# Patient Record
Sex: Male | Born: 1960 | Race: Black or African American | Hispanic: No | Marital: Single | Smoking: Current every day smoker
Health system: Southern US, Community
[De-identification: ages and names within clinical notes are randomized; demographics above are authoritative.]

## PROBLEM LIST (undated history)

## (undated) DIAGNOSIS — E119 Type 2 diabetes mellitus without complications: Secondary | ICD-10-CM

## (undated) DIAGNOSIS — E7800 Pure hypercholesterolemia, unspecified: Secondary | ICD-10-CM

## (undated) DIAGNOSIS — K259 Gastric ulcer, unspecified as acute or chronic, without hemorrhage or perforation: Secondary | ICD-10-CM

## (undated) DIAGNOSIS — I10 Essential (primary) hypertension: Secondary | ICD-10-CM

## (undated) HISTORY — DX: Gastric ulcer, unspecified as acute or chronic, without hemorrhage or perforation: K25.9

## (undated) HISTORY — PX: COLONOSCOPY: SHX174

## (undated) HISTORY — PX: FEMUR CLOSED REDUCTION: SHX939

---

## 2005-08-10 ENCOUNTER — Emergency Department: Payer: Self-pay | Admitting: Internal Medicine

## 2007-02-19 ENCOUNTER — Emergency Department: Payer: Self-pay | Admitting: Emergency Medicine

## 2007-02-25 ENCOUNTER — Emergency Department: Payer: Self-pay | Admitting: Emergency Medicine

## 2008-06-08 ENCOUNTER — Emergency Department: Payer: Self-pay | Admitting: Emergency Medicine

## 2008-09-25 ENCOUNTER — Inpatient Hospital Stay: Payer: Self-pay | Admitting: Internal Medicine

## 2008-12-04 ENCOUNTER — Emergency Department: Payer: Self-pay | Admitting: Internal Medicine

## 2013-11-12 ENCOUNTER — Emergency Department: Payer: Self-pay | Admitting: Emergency Medicine

## 2013-11-12 LAB — COMPREHENSIVE METABOLIC PANEL
ALK PHOS: 171 U/L — AB
Albumin: 3.3 g/dL — ABNORMAL LOW (ref 3.4–5.0)
Anion Gap: 6 — ABNORMAL LOW (ref 7–16)
BUN: 11 mg/dL (ref 7–18)
Bilirubin,Total: 0.5 mg/dL (ref 0.2–1.0)
CHLORIDE: 104 mmol/L (ref 98–107)
CREATININE: 1.27 mg/dL (ref 0.60–1.30)
Calcium, Total: 9 mg/dL (ref 8.5–10.1)
Co2: 26 mmol/L (ref 21–32)
EGFR (African American): >60
EGFR (Non-African Amer.): >60
Glucose: 434 mg/dL — ABNORMAL HIGH (ref 65–99)
Osmolality: 290 (ref 275–301)
Potassium: 4.1 mmol/L (ref 3.5–5.1)
SGOT(AST): 24 U/L (ref 15–37)
SGPT (ALT): 32 U/L (ref 12–78)
Sodium: 136 mmol/L (ref 136–145)
Total Protein: 7.3 g/dL (ref 6.4–8.2)

## 2013-11-12 LAB — CBC
HCT: 41.4 % (ref 40.0–52.0)
HGB: 14.3 g/dL (ref 13.0–18.0)
MCH: 32 pg (ref 26.0–34.0)
MCHC: 34.7 g/dL (ref 32.0–36.0)
MCV: 92 fL (ref 80–100)
Platelet: 226 10*3/uL (ref 150–440)
RBC: 4.48 10*6/uL (ref 4.40–5.90)
RDW: 12.9 % (ref 11.5–14.5)
WBC: 8.1 10*3/uL (ref 3.8–10.6)

## 2013-11-12 LAB — URINALYSIS, COMPLETE
BILIRUBIN, UR: NEGATIVE
Bacteria: NONE SEEN
Glucose,UR: >=500 mg/dL (ref 0–75)
Ketone: NEGATIVE
NITRITE: NEGATIVE
PH: 5 (ref 4.5–8.0)
Protein: NEGATIVE
RBC,UR: 17 /HPF (ref 0–5)
Specific Gravity: 1.029 (ref 1.003–1.030)
WBC UR: 18 /HPF (ref 0–5)

## 2014-04-28 ENCOUNTER — Emergency Department: Payer: Self-pay | Admitting: Emergency Medicine

## 2014-04-28 LAB — LIPASE, BLOOD: Lipase: 121 U/L (ref 73–393)

## 2014-04-28 LAB — BASIC METABOLIC PANEL
ANION GAP: 9 (ref 7–16)
BUN: 9 mg/dL (ref 7–18)
Calcium, Total: 8.4 mg/dL — ABNORMAL LOW (ref 8.5–10.1)
Chloride: 105 mmol/L (ref 98–107)
Co2: 23 mmol/L (ref 21–32)
Creatinine: 1.2 mg/dL (ref 0.60–1.30)
Glucose: 492 mg/dL — ABNORMAL HIGH (ref 65–99)
OSMOLALITY: 294 (ref 275–301)
POTASSIUM: 3.7 mmol/L (ref 3.5–5.1)
Sodium: 137 mmol/L (ref 136–145)

## 2014-04-28 LAB — HEPATIC FUNCTION PANEL A (ARMC)
ALBUMIN: 3.2 g/dL — AB (ref 3.4–5.0)
ALK PHOS: 147 U/L — AB
ALT: 40 U/L
AST: 26 U/L (ref 15–37)
BILIRUBIN TOTAL: 0.5 mg/dL (ref 0.2–1.0)
Bilirubin, Direct: 0.1 mg/dL (ref 0.0–0.2)
Total Protein: 6.7 g/dL (ref 6.4–8.2)

## 2014-04-28 LAB — CBC
HCT: 39.4 % — AB (ref 40.0–52.0)
HGB: 13.7 g/dL (ref 13.0–18.0)
MCH: 31.8 pg (ref 26.0–34.0)
MCHC: 34.6 g/dL (ref 32.0–36.0)
MCV: 92 fL (ref 80–100)
Platelet: 224 10*3/uL (ref 150–440)
RBC: 4.3 10*6/uL — ABNORMAL LOW (ref 4.40–5.90)
RDW: 12.6 % (ref 11.5–14.5)
WBC: 9.2 10*3/uL (ref 3.8–10.6)

## 2014-04-28 LAB — TROPONIN I

## 2014-05-08 ENCOUNTER — Emergency Department: Payer: Self-pay | Admitting: Emergency Medicine

## 2014-05-08 LAB — COMPREHENSIVE METABOLIC PANEL
Albumin: 3.1 g/dL — ABNORMAL LOW (ref 3.4–5.0)
Alkaline Phosphatase: 141 U/L — ABNORMAL HIGH
Anion Gap: 13 (ref 7–16)
BUN: 10 mg/dL (ref 7–18)
Bilirubin,Total: 0.4 mg/dL (ref 0.2–1.0)
CHLORIDE: 103 mmol/L (ref 98–107)
Calcium, Total: 7.8 mg/dL — ABNORMAL LOW (ref 8.5–10.1)
Co2: 22 mmol/L (ref 21–32)
Creatinine: 0.98 mg/dL (ref 0.60–1.30)
EGFR (African American): >60
EGFR (Non-African Amer.): >60
GLUCOSE: 416 mg/dL — AB (ref 65–99)
OSMOLALITY: 292 (ref 275–301)
POTASSIUM: 3.5 mmol/L (ref 3.5–5.1)
SGOT(AST): 18 U/L (ref 15–37)
SGPT (ALT): 31 U/L
SODIUM: 138 mmol/L (ref 136–145)
TOTAL PROTEIN: 5.9 g/dL — AB (ref 6.4–8.2)

## 2014-05-08 LAB — CBC
HCT: 37.6 % — AB (ref 40.0–52.0)
HGB: 13.6 g/dL (ref 13.0–18.0)
MCH: 34.1 pg — ABNORMAL HIGH (ref 26.0–34.0)
MCHC: 36.2 g/dL — ABNORMAL HIGH (ref 32.0–36.0)
MCV: 94 fL (ref 80–100)
PLATELETS: 258 10*3/uL (ref 150–440)
RBC: 3.99 10*6/uL — AB (ref 4.40–5.90)
RDW: 13.1 % (ref 11.5–14.5)
WBC: 9.8 10*3/uL (ref 3.8–10.6)

## 2014-05-08 LAB — LIPASE, BLOOD: LIPASE: 165 U/L (ref 73–393)

## 2014-05-08 LAB — URINALYSIS, COMPLETE
BILIRUBIN, UR: NEGATIVE
BLOOD: NEGATIVE
Bacteria: NONE SEEN
Glucose,UR: >=500 mg/dL (ref 0–75)
Ketone: NEGATIVE
Leukocyte Esterase: NEGATIVE
Nitrite: NEGATIVE
PROTEIN: NEGATIVE
Ph: 6 (ref 4.5–8.0)
Specific Gravity: 1.035 (ref 1.003–1.030)
Squamous Epithelial: <1
WBC UR: <1 /HPF (ref 0–5)

## 2014-05-08 LAB — TROPONIN I: Troponin-I: <0.02 ng/mL

## 2014-05-15 ENCOUNTER — Ambulatory Visit: Payer: Self-pay | Admitting: Gastroenterology

## 2014-05-19 ENCOUNTER — Emergency Department (HOSPITAL_COMMUNITY)
Admission: EM | Admit: 2014-05-19 | Discharge: 2014-05-19 | Disposition: A | Discharge status: Home / Self Care | Payer: Self-pay | Attending: Emergency Medicine | Admitting: Emergency Medicine

## 2014-05-19 ENCOUNTER — Encounter (HOSPITAL_COMMUNITY): Payer: Self-pay | Admitting: Emergency Medicine

## 2014-05-19 ENCOUNTER — Emergency Department (HOSPITAL_COMMUNITY): Payer: Self-pay

## 2014-05-19 DIAGNOSIS — M549 Dorsalgia, unspecified: Secondary | ICD-10-CM | POA: Insufficient documentation

## 2014-05-19 DIAGNOSIS — E119 Type 2 diabetes mellitus without complications: Secondary | ICD-10-CM | POA: Insufficient documentation

## 2014-05-19 DIAGNOSIS — I10 Essential (primary) hypertension: Secondary | ICD-10-CM | POA: Insufficient documentation

## 2014-05-19 DIAGNOSIS — K253 Acute gastric ulcer without hemorrhage or perforation: Secondary | ICD-10-CM | POA: Insufficient documentation

## 2014-05-19 DIAGNOSIS — R195 Other fecal abnormalities: Secondary | ICD-10-CM | POA: Insufficient documentation

## 2014-05-19 DIAGNOSIS — R109 Unspecified abdominal pain: Secondary | ICD-10-CM

## 2014-05-19 DIAGNOSIS — R079 Chest pain, unspecified: Secondary | ICD-10-CM | POA: Insufficient documentation

## 2014-05-19 DIAGNOSIS — K5900 Constipation, unspecified: Secondary | ICD-10-CM | POA: Insufficient documentation

## 2014-05-19 HISTORY — DX: Type 2 diabetes mellitus without complications: E11.9

## 2014-05-19 HISTORY — DX: Essential (primary) hypertension: I10

## 2014-05-19 MED ORDER — TRAMADOL HCL 50 MG PO TABS: 50.0000 mg | ORAL_TABLET | Freq: Four times a day (QID) | ORAL | Status: DC | PRN

## 2014-05-19 NOTE — ED Notes (Signed)
Pt reports swelling and pain below LUOQ x 2 weeks. Pain radiating to back. Had colonoscopy on Wednesday, no change in pain or swelling since procedure

## 2014-05-19 NOTE — ED Notes (Signed)
[facility] [facility]

## 2014-05-19 NOTE — Discharge Instructions (Signed)
Peptic Ulcer A peptic ulcer is a sore in the lining of your esophagus (esophageal ulcer), stomach (gastric ulcer), or in the first part of your small intestine (duodenal ulcer). The ulcer causes erosion into the deeper tissue. CAUSES  Normally, the lining of the stomach and the small intestine protects itself from the acid that digests food. The protective lining can be damaged by:  An infection caused by a bacterium called Helicobacter pylori (H. pylori).  Regular use of nonsteroidal anti-inflammatory drugs (NSAIDs), such as ibuprofen or aspirin.  Smoking tobacco. Other risk factors include being older than 50, drinking alcohol excessively, and having a family history of ulcer disease.  SYMPTOMS   Burning pain or gnawing in the area between the chest and the belly button.  Heartburn.  Nausea and vomiting.  Bloating. The pain can be worse on an empty stomach and at night. If the ulcer results in bleeding, it can cause:  Black, tarry stools.  Vomiting of bright red blood.  Vomiting of coffee-ground-looking materials. DIAGNOSIS  A diagnosis is usually made based upon your history and an exam. Other tests and procedures may be performed to find the cause of the ulcer. Finding a cause will help determine the best treatment. Tests and procedures may include:  Blood tests, stool tests, or breath tests to check for the bacterium H. pylori.  An upper gastrointestinal (GI) series of the esophagus, stomach, and small intestine.  An endoscopy to examine the esophagus, stomach, and small intestine.  A biopsy. TREATMENT  Treatment may include:  Eliminating the cause of the ulcer, such as smoking, NSAIDs, or alcohol.  Medicines to reduce the amount of acid in your digestive tract.  Antibiotic medicines if the ulcer is caused by the H. pylori bacterium.  An upper endoscopy to treat a bleeding ulcer.  Surgery if the bleeding is severe or if the ulcer created a hole somewhere in the  digestive system. HOME CARE INSTRUCTIONS   Avoid tobacco, alcohol, and caffeine. Smoking can increase the acid in the stomach, and continued smoking will impair the healing of ulcers.  Avoid foods and drinks that seem to cause discomfort or aggravate your ulcer.  Only take medicines as directed by your caregiver. Do not substitute over-the-counter medicines for prescription medicines without talking to your caregiver.  Keep any follow-up appointments and tests as directed. SEEK MEDICAL CARE IF:   Your do not improve within 7 days of starting treatment.  You have ongoing indigestion or heartburn. SEEK IMMEDIATE MEDICAL CARE IF:   You have sudden, sharp, or persistent abdominal pain.  You have bloody or [place] black, tarry stools.  You vomit blood or vomit that looks like coffee grounds.  You become light-headed, weak, or feel faint.  You become sweaty or clammy. MAKE SURE YOU:   Understand these instructions.  Will watch your condition.  Will get help right away if you are not doing well or get worse. Document Released: 06/19/2000 Document Revised: 11/06/2013 Document Reviewed: 01/20/2012 [facility] Patient Information 2015 [place], [place]. This information is not intended to replace advice given to you by your health care provider. Make sure you discuss any questions you have with your health care provider.   Abdominal Pain Many things can cause abdominal pain. Usually, abdominal pain is not caused by a disease and will improve without treatment. It can often be observed and treated at home. Your health care provider will do a physical exam and possibly order blood tests and X-rays to help determine the seriousness  of your pain. However, in many cases, more time must pass before a clear cause of the pain can be found. Before that point, your health care provider may not know if you need more testing or further treatment. HOME CARE INSTRUCTIONS  Monitor your abdominal pain for any  changes. The following actions may help to alleviate any discomfort you are experiencing:  Only take over-the-counter or prescription medicines as directed by your health care provider.  Do not take laxatives unless directed to do so by your health care provider.  Try a clear liquid diet (broth, tea, or water) as directed by your health care provider. Slowly move to a bland diet as tolerated. SEEK MEDICAL CARE IF:  You have unexplained abdominal pain.  You have abdominal pain associated with nausea or diarrhea.  You have pain when you urinate or have a bowel movement.  You experience abdominal pain that wakes you in the night.  You have abdominal pain that is worsened or improved by eating food.  You have abdominal pain that is worsened with eating fatty foods.  You have a fever. SEEK IMMEDIATE MEDICAL CARE IF:   Your pain does not go away within 2 hours.  You keep throwing up (vomiting).  Your pain is felt only in portions of the abdomen, such as the right side or the left lower portion of the abdomen.  You pass bloody or black tarry stools. MAKE SURE YOU:  Understand these instructions.   Will watch your condition.   Will get help right away if you are not doing well or get worse.  Document Released: 04/01/2005 Document Revised: 06/27/2013 Document Reviewed: 03/01/2013 [facility] Patient Information 2015 [place], [place]. This information is not intended to replace advice given to you by your health care provider. Make sure you discuss any questions you have with your health care provider.

## 2014-05-19 NOTE — ED Provider Notes (Signed)
CSN: [id number]     Arrival date & time 05/19/14  1628 History  This chart was scribed for non-physician practitioner, Terri Piedraourtney Forcucci, PA-C, working with Warnell Foresterrey Wofford, MD, by Modena JanskyAlbert Thayil, ED Scribe. This patient was seen in room WTR5/WTR5 and the patient's care was started at 6:13 PM.    Chief Complaint  Patient presents with  . Abdominal Pain    l/upper abdominal pain -swelling on l/UOQ   The history is provided by the patient. No language interpreter was used.   HPI Comments: Chad BlissDon Matthew Debois Sr. is a 53 y.o. male with a hx of DM and HTN who presents to the Emergency Department complaining of constant moderate generalized abdominal pain that started about 2 weeks ago. He reports that the worsening pain radiates to his back with associated right sided abdominal swelling. He describes the pain as a stabbing sensation. He states that the pain is exacerbated by laughing and twisting his torso. He reports that he has been taking BC powder with some relief. He states that he had chest pain, constipation, abdominal bloating, and black stools. He states that he had a colonoscopy 3 days ago, which had no affect on his symptoms. He denies any SOB, nausea, emesis, or diarrhea.   Past Medical History  Diagnosis Date  . Diabetes mellitus without complication   . Hypertension    Past Surgical History  Procedure Laterality Date  . Colonoscopy     No family history on file. History  Substance Use Topics  . Smoking status: Not on file  . Smokeless tobacco: Not on file  . Alcohol Use: Not on file    Review of Systems  Respiratory: Negative for shortness of breath.   Cardiovascular: Positive for chest pain.  Gastrointestinal: Positive for abdominal pain, constipation and blood in stool. Negative for nausea, vomiting and diarrhea.  Musculoskeletal: Positive for back pain.  All other systems reviewed and are negative.   Allergies  Review of patient's allergies indicates no known  allergies.  Home Medications   Prior to Admission medications   Not on File   BP 126/80 mmHg  Pulse 96  Temp(Src) 98.1 F (36.7 C) (Oral)  Resp 18  SpO2 99% Physical Exam  Constitutional: He is oriented to person, place, and time. He appears well-developed and well-nourished. No distress.  HENT:  Head: Normocephalic and atraumatic.  Nose: Nose normal.  Mouth/Throat: Oropharynx is clear and moist. No oropharyngeal exudate.  Eyes: Pupils are equal, round, and reactive to light.  Neck: Normal range of motion. No tracheal deviation present.  Cardiovascular: Normal rate, regular rhythm and normal heart sounds.  Exam reveals no gallop and no friction rub.   No murmur heard. Pulmonary/Chest: Effort normal and breath sounds normal. No respiratory distress. He has no wheezes. He has no rales.  Abdominal: Soft. Normal appearance. There is tenderness in the epigastric area and left upper quadrant. There is no rigidity, no rebound, no guarding, no tenderness at McBurney's point and negative Murphy's sign.  Small left sided left lateral lower quadrant hernia.   Musculoskeletal: Normal range of motion.  Lymphadenopathy:    He has no cervical adenopathy.  Neurological: He is alert and oriented to person, place, and time.  Skin: Skin is warm and dry.  Psychiatric: He has a normal mood and affect. His behavior is normal.  Nursing note and vitals reviewed.   ED Course  Procedures (including critical care time) DIAGNOSTIC STUDIES: Oxygen Saturation is 99% on RA, normal by my interpretation.  COORDINATION OF CARE: 6:17 PM- Pt advised of plan for treatment which includes medication and radiology and pt agrees.  Labs Review Labs Reviewed - No data to display  Imaging Review Dg Abd 2 Views  05/19/2014   CLINICAL DATA:  Left abdominal pain and swelling  EXAM: ABDOMEN - 2 VIEW  COMPARISON:  05/15/2014 CT  FINDINGS: A moderate amount of colonic stool is noted.  The bowel gas pattern is  otherwise unremarkable.  There is no evidence of bowel obstruction or pneumoperitoneum.  No suspicious calcifications are present.  No acute bony abnormalities are identified.  IMPRESSION: Moderate colonic stool without other significant abnormality.   Electronically Signed   By: Laveda AbbeJeff  Hu M.D.   On: 05/19/2014 18:40     EKG Interpretation None      MDM   Final diagnoses:  Abdominal pain  Acute gastric ulcer   Patient is a 53 y.o. Male who presents to the ED with abdominal pain.  Physical exam reveals soft abdomen without rigidity or guarding with epigastric tenderness.  Patient recently had a colonoscopy and upper endoscopy which reveals gastric ulcers and 2 polyps.  Given that patient is now three days out from colonoscopy and has a soft abdomen without evidence of peritoneal signs doubt that there is likely bowel perforation.  Abdominal xray is negative for free air.  Suspect that the patient may have a small left sided ventral hernia which feels as though it reduces and is non-tender to palpation.  Patient already on PPI and ranitidine.  Will discharge home with tramdol for moderate to severe pain. Patient states understanding and agreement.  Patient to return for hematemesis and coffee ground emesis or severe abdominal pain.  Patient is to follow up with Dr. Shelle Ironein his gastroenterologist.        I personally performed the services described in this documentation, which was scribed in my presence. The recorded information has been reviewed and is accurate.    Chad Burowourtney A Forcucci, PA-C 05/19/14 1848  Chad Burowourtney A Forcucci, PA-C 05/19/14 2049  Warnell Foresterrey Wofford, MD 05/19/14 [phone]

## 2014-11-30 ENCOUNTER — Encounter: Payer: Self-pay | Admitting: Occupational Medicine

## 2014-11-30 ENCOUNTER — Other Ambulatory Visit: Payer: Self-pay

## 2014-11-30 ENCOUNTER — Emergency Department: Payer: Self-pay

## 2014-11-30 ENCOUNTER — Emergency Department
Admission: EM | Admit: 2014-11-30 | Discharge: 2014-11-30 | Disposition: A | Discharge status: Home / Self Care | Payer: Self-pay | Attending: Emergency Medicine | Admitting: Emergency Medicine

## 2014-11-30 DIAGNOSIS — R079 Chest pain, unspecified: Secondary | ICD-10-CM | POA: Insufficient documentation

## 2014-11-30 DIAGNOSIS — X58XXXA Exposure to other specified factors, initial encounter: Secondary | ICD-10-CM | POA: Insufficient documentation

## 2014-11-30 DIAGNOSIS — E119 Type 2 diabetes mellitus without complications: Secondary | ICD-10-CM | POA: Insufficient documentation

## 2014-11-30 DIAGNOSIS — I10 Essential (primary) hypertension: Secondary | ICD-10-CM | POA: Insufficient documentation

## 2014-11-30 DIAGNOSIS — S8391XA Sprain of unspecified site of right knee, initial encounter: Secondary | ICD-10-CM | POA: Insufficient documentation

## 2014-11-30 DIAGNOSIS — Z79899 Other long term (current) drug therapy: Secondary | ICD-10-CM | POA: Insufficient documentation

## 2014-11-30 DIAGNOSIS — Y9389 Activity, other specified: Secondary | ICD-10-CM | POA: Insufficient documentation

## 2014-11-30 DIAGNOSIS — Z720 Tobacco use: Secondary | ICD-10-CM | POA: Insufficient documentation

## 2014-11-30 DIAGNOSIS — Y9289 Other specified places as the place of occurrence of the external cause: Secondary | ICD-10-CM | POA: Insufficient documentation

## 2014-11-30 DIAGNOSIS — Y998 Other external cause status: Secondary | ICD-10-CM | POA: Insufficient documentation

## 2014-11-30 LAB — URINALYSIS COMPLETE WITH MICROSCOPIC (ARMC ONLY)
Bacteria, UA: NONE SEEN
Bilirubin Urine: NEGATIVE
Glucose, UA: NEGATIVE mg/dL
HGB URINE DIPSTICK: NEGATIVE
Ketones, ur: NEGATIVE mg/dL
Leukocytes, UA: NEGATIVE
NITRITE: NEGATIVE
PROTEIN: NEGATIVE mg/dL
Specific Gravity, Urine: 1.029 (ref 1.005–1.030)
pH: 5 (ref 5.0–8.0)

## 2014-11-30 LAB — BASIC METABOLIC PANEL
ANION GAP: 9 (ref 5–15)
BUN: 14 mg/dL (ref 6–20)
CO2: 27 mmol/L (ref 22–32)
CREATININE: 0.95 mg/dL (ref 0.61–1.24)
Calcium: 9.6 mg/dL (ref 8.9–10.3)
Chloride: 103 mmol/L (ref 101–111)
GFR calc Af Amer: >60 mL/min (ref >60)
GFR calc non Af Amer: >60 mL/min (ref >60)
GLUCOSE: 149 mg/dL — AB (ref 65–99)
Potassium: 3.5 mmol/L (ref 3.5–5.1)
Sodium: 139 mmol/L (ref 135–145)

## 2014-11-30 LAB — CBC
HCT: 40.4 % (ref 40.0–52.0)
Hemoglobin: 14.4 g/dL (ref 13.0–18.0)
MCH: 32.4 pg (ref 26.0–34.0)
MCHC: 35.5 g/dL (ref 32.0–36.0)
MCV: 91.1 fL (ref 80.0–100.0)
Platelets: 281 10*3/uL (ref 150–440)
RBC: 4.44 MIL/uL (ref 4.40–5.90)
RDW: 13 % (ref 11.5–14.5)
WBC: 10.2 10*3/uL (ref 3.8–10.6)

## 2014-11-30 LAB — TROPONIN I: Troponin I: <0.03 ng/mL (ref <0.031)

## 2014-11-30 MED ORDER — KETOROLAC TROMETHAMINE 30 MG/ML IJ SOLN
30.0000 mg | Freq: Once | INTRAMUSCULAR | Status: AC
  Administered 2014-11-30: 30 mg via INTRAVENOUS

## 2014-11-30 MED ORDER — NAPROXEN 500 MG PO TABS: 500.0000 mg | ORAL_TABLET | Freq: Two times a day (BID) | ORAL | Status: DC

## 2014-11-30 MED ORDER — SODIUM CHLORIDE 0.9 % IV BOLUS (SEPSIS)
1000.0000 mL | Freq: Once | INTRAVENOUS | Status: AC
  Administered 2014-11-30: 1000 mL via INTRAVENOUS

## 2014-11-30 MED ORDER — KETOROLAC TROMETHAMINE 30 MG/ML IJ SOLN
INTRAMUSCULAR | Status: AC
  Administered 2014-11-30: 30 mg via INTRAVENOUS
  Filled 2014-11-30: qty 1
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
## 2014-11-30 NOTE — ED Notes (Signed)
Chest pain for few weeks worse tonight, also right groin and leg pain.

## 2014-11-30 NOTE — Discharge Instructions (Signed)
1. Take pain medicine as needed (Naproxen #14). 2. Return to the ER for worsening symptoms, persistent vomiting, difficulty breathing or other concerns.  Chest Pain (Nonspecific) It is often hard to give a specific diagnosis for the cause of chest pain. There is always a chance that your pain could be related to something serious, such as a heart attack or a blood clot in the lungs. You need to follow up with your health care provider for further evaluation. CAUSES   Heartburn.  Pneumonia or bronchitis.  Anxiety or stress.  Inflammation around your heart (pericarditis) or lung (pleuritis or pleurisy).  A blood clot in the lung.  A collapsed lung (pneumothorax). It can develop suddenly on its own (spontaneous pneumothorax) or from trauma to the chest.  Shingles infection (herpes zoster virus). The chest wall is composed of bones, muscles, and cartilage. Any of these can be the source of the pain.  The bones can be bruised by injury.  The muscles or cartilage can be strained by coughing or overwork.  The cartilage can be affected by inflammation and become sore (costochondritis). DIAGNOSIS  Lab tests or other studies may be needed to find the cause of your pain. Your health care provider may have you take a test called an ambulatory electrocardiogram (ECG). An ECG records your heartbeat patterns over a 24-hour period. You may also have other tests, such as:  Transthoracic echocardiogram (TTE). During echocardiography, sound waves are used to evaluate how blood flows through your heart.  Transesophageal echocardiogram (TEE).  Cardiac monitoring. This allows your health care provider to monitor your heart rate and rhythm in real time.  Holter monitor. This is a portable device that records your heartbeat and can help diagnose heart arrhythmias. It allows your health care provider to track your heart activity for several days, if needed.  Stress tests by exercise or by giving  medicine that makes the heart beat faster. TREATMENT   Treatment depends on what may be causing your chest pain. Treatment may include:  Acid blockers for heartburn.  Anti-inflammatory medicine.  Pain medicine for inflammatory conditions.  Antibiotics if an infection is present.  You may be advised to change lifestyle habits. This includes stopping smoking and avoiding alcohol, caffeine, and chocolate.  You may be advised to keep your head raised (elevated) when sleeping. This reduces the chance of acid going backward from your stomach into your esophagus. Most of the time, nonspecific chest pain will improve within 2-3 days with rest and mild pain medicine.  HOME CARE INSTRUCTIONS   If antibiotics were prescribed, take them as directed. Finish them even if you start to feel better.  For the next few days, avoid physical activities that bring on chest pain. Continue physical activities as directed.  Do not use any tobacco products, including cigarettes, chewing tobacco, or electronic cigarettes.  Avoid drinking alcohol.  Only take medicine as directed by your health care provider.  Follow your health care provider's suggestions for further testing if your chest pain does not go away.  Keep any follow-up appointments you made. If you do not go to an appointment, you could develop lasting (chronic) problems with pain. If there is any problem keeping an appointment, call to reschedule. SEEK MEDICAL CARE IF:   Your chest pain does not go away, even after treatment.  You have a rash with blisters on your chest.  You have a fever. SEEK IMMEDIATE MEDICAL CARE IF:   You have increased chest pain or pain that  spreads to your arm, neck, jaw, back, or abdomen.  You have shortness of breath.  You have an increasing cough, or you cough up blood.  You have severe back or abdominal pain.  You feel nauseous or vomit.  You have severe weakness.  You faint.  You have  chills. This is an emergency. Do not wait to see if the pain will go away. Get medical help at once. Call your local emergency services (911 in [place] Do not drive yourself to the hospital. MAKE SURE YOU:   Understand these instructions.  Will watch your condition.  Will get help right away if you are not doing well or get worse. Document Released: 04/01/2005 Document Revised: 06/27/2013 Document Reviewed: 01/26/2008 [facility] Patient Information 2015 [place]. This information is not intended to replace advice given to you by your health care provider. Make sure you discuss any questions you have with your health care provider.  Knee Sprain A knee sprain is a tear in one of the strong, fibrous tissues that connect the bones (ligaments) in your knee. The severity of the sprain depends on how much of the ligament is torn. The tear can be either partial or complete. CAUSES  Often, sprains are a result of a fall or injury. The force of the impact causes the fibers of your ligament to stretch too much. This excess tension causes the fibers of your ligament to tear. SIGNS AND SYMPTOMS  You may have some loss of motion in your knee. Other symptoms include:  Bruising.  Pain in the knee area.  Tenderness of the knee to the touch.  Swelling. DIAGNOSIS  To diagnose a knee sprain, your health care provider will physically examine your knee. Your health care provider may also suggest an X-ray exam of your knee to make sure no bones are broken. TREATMENT  If your ligament is only partially torn, treatment usually involves keeping the knee in a fixed position (immobilization) or bracing your knee for activities that require movement for several weeks. To do this, your health care provider will apply a bandage, cast, or splint to keep your knee from moving and to support your knee during movement until it heals. For a partially torn ligament, the healing process usually takes 4-6 weeks. If your  ligament is completely torn, depending on which ligament it is, you may need surgery to reconnect the ligament to the bone or reconstruct it. After surgery, a cast or splint may be applied and will need to stay on your knee for 4-6 weeks while your ligament heals. HOME CARE INSTRUCTIONS  Keep your injured knee elevated to decrease swelling.  To ease pain and swelling, apply ice to the injured area:  Put ice in a plastic bag.  Place a towel between your skin and the bag.  Leave the ice on for 20 minutes, 2-3 times a day.  Only take medicine for pain as directed by your health care provider.  Do not leave your knee unprotected until pain and stiffness go away (usually 4-6 weeks).  If you have a cast or splint, do not allow it to get wet. If you have been instructed not to remove it, cover it with a plastic bag when you shower or bathe. Do not swim.  Your health care provider may suggest exercises for you to do during your recovery to prevent or limit permanent weakness and stiffness. SEEK IMMEDIATE MEDICAL CARE IF:  Your cast or splint becomes damaged.  Your pain becomes worse.  You have significant pain, swelling, or numbness below the cast or splint. MAKE SURE YOU:  Understand these instructions.  Will watch your condition.  Will get help right away if you are not doing well or get worse. Document Released: 06/22/2005 Document Revised: 04/12/2013 Document Reviewed: 02/01/2013 [facility] Patient Information 2015 [place], [place]. This information is not intended to replace advice given to you by your health care provider. Make sure you discuss any questions you have with your health care provider.

## 2015-05-01 ENCOUNTER — Emergency Department
Admission: EM | Admit: 2015-05-01 | Discharge: 2015-05-01 | Disposition: A | Discharge status: Home / Self Care | Payer: Self-pay | Attending: Student | Admitting: Student

## 2015-05-01 ENCOUNTER — Encounter: Payer: Self-pay | Admitting: Emergency Medicine

## 2015-05-01 ENCOUNTER — Emergency Department: Payer: Self-pay

## 2015-05-01 DIAGNOSIS — I10 Essential (primary) hypertension: Secondary | ICD-10-CM | POA: Insufficient documentation

## 2015-05-01 DIAGNOSIS — E1165 Type 2 diabetes mellitus with hyperglycemia: Secondary | ICD-10-CM

## 2015-05-01 DIAGNOSIS — M792 Neuralgia and neuritis, unspecified: Secondary | ICD-10-CM

## 2015-05-01 DIAGNOSIS — Z720 Tobacco use: Secondary | ICD-10-CM | POA: Insufficient documentation

## 2015-05-01 DIAGNOSIS — B372 Candidiasis of skin and nail: Secondary | ICD-10-CM

## 2015-05-01 DIAGNOSIS — M5030 Other cervical disc degeneration, unspecified cervical region: Secondary | ICD-10-CM | POA: Insufficient documentation

## 2015-05-01 DIAGNOSIS — M47812 Spondylosis without myelopathy or radiculopathy, cervical region: Secondary | ICD-10-CM

## 2015-05-01 DIAGNOSIS — E119 Type 2 diabetes mellitus without complications: Secondary | ICD-10-CM | POA: Insufficient documentation

## 2015-05-01 DIAGNOSIS — M5410 Radiculopathy, site unspecified: Secondary | ICD-10-CM | POA: Insufficient documentation

## 2015-05-01 DIAGNOSIS — B379 Candidiasis, unspecified: Secondary | ICD-10-CM | POA: Insufficient documentation

## 2015-05-01 DIAGNOSIS — IMO0001 Reserved for inherently not codable concepts without codable children: Secondary | ICD-10-CM

## 2015-05-01 LAB — CBC WITH DIFFERENTIAL/PLATELET
BASOS PCT: 1 %
Basophils Absolute: 0.1 10*3/uL (ref 0–0.1)
EOS ABS: 0.4 10*3/uL (ref 0–0.7)
EOS PCT: 4 %
HEMATOCRIT: 42.1 % (ref 40.0–52.0)
Hemoglobin: 14.8 g/dL (ref 13.0–18.0)
Lymphocytes Relative: 27 %
Lymphs Abs: 2.6 10*3/uL (ref 1.0–3.6)
MCH: 31.5 pg (ref 26.0–34.0)
MCHC: 35.1 g/dL (ref 32.0–36.0)
MCV: 89.8 fL (ref 80.0–100.0)
MONO ABS: 0.6 10*3/uL (ref 0.2–1.0)
MONOS PCT: 7 %
Neutro Abs: 5.6 10*3/uL (ref 1.4–6.5)
Neutrophils Relative %: 61 %
PLATELETS: 229 10*3/uL (ref 150–440)
RBC: 4.68 MIL/uL (ref 4.40–5.90)
RDW: 12.6 % (ref 11.5–14.5)
WBC: 9.3 10*3/uL (ref 3.8–10.6)

## 2015-05-01 LAB — URINALYSIS COMPLETE WITH MICROSCOPIC (ARMC ONLY)
Bacteria, UA: NONE SEEN
Bilirubin Urine: NEGATIVE
Glucose, UA: >500 mg/dL — AB
Hgb urine dipstick: NEGATIVE
KETONES UR: NEGATIVE mg/dL
LEUKOCYTES UA: NEGATIVE
Nitrite: NEGATIVE
PH: 7 (ref 5.0–8.0)
PROTEIN: NEGATIVE mg/dL
RBC / HPF: NONE SEEN RBC/hpf (ref 0–5)
SPECIFIC GRAVITY, URINE: 1.03 (ref 1.005–1.030)
WBC UA: NONE SEEN WBC/hpf (ref 0–5)

## 2015-05-01 LAB — COMPREHENSIVE METABOLIC PANEL
ALBUMIN: 3.9 g/dL (ref 3.5–5.0)
ALT: 34 U/L (ref 17–63)
ANION GAP: 6 (ref 5–15)
AST: 20 U/L (ref 15–41)
Alkaline Phosphatase: 132 U/L — ABNORMAL HIGH (ref 38–126)
BILIRUBIN TOTAL: 1 mg/dL (ref 0.3–1.2)
BUN: 12 mg/dL (ref 6–20)
CHLORIDE: 101 mmol/L (ref 101–111)
CO2: 28 mmol/L (ref 22–32)
Calcium: 9.3 mg/dL (ref 8.9–10.3)
Creatinine, Ser: 1.21 mg/dL (ref 0.61–1.24)
GFR calc Af Amer: >60 mL/min (ref >60)
GFR calc non Af Amer: >60 mL/min (ref >60)
GLUCOSE: 548 mg/dL — AB (ref 65–99)
POTASSIUM: 4.2 mmol/L (ref 3.5–5.1)
SODIUM: 135 mmol/L (ref 135–145)
TOTAL PROTEIN: 7.2 g/dL (ref 6.5–8.1)

## 2015-05-01 LAB — GLUCOSE, CAPILLARY
Glucose-Capillary: 522 mg/dL — ABNORMAL HIGH (ref 65–99)
Glucose-Capillary: 427 mg/dL — ABNORMAL HIGH (ref 65–99)
Glucose-Capillary: 110 mg/dL — ABNORMAL HIGH (ref 65–99)

## 2015-05-01 MED ORDER — INSULIN REGULAR HUMAN 100 UNIT/ML IJ SOLN: 10.0000 [IU] | Freq: Once | INTRAMUSCULAR | Status: DC

## 2015-05-01 MED ORDER — INSULIN REGULAR HUMAN 100 UNIT/ML IJ SOLN
10.0000 [IU] | Freq: Once | INTRAMUSCULAR | Status: DC
  Filled 2015-05-01: qty 0.1

## 2015-05-01 MED ORDER — SODIUM CHLORIDE 0.9 % IV BOLUS (SEPSIS)
1000.0000 mL | Freq: Once | INTRAVENOUS | Status: AC
  Administered 2015-05-01: 1000 mL via INTRAVENOUS

## 2015-05-01 MED ORDER — FLUCONAZOLE 150 MG PO TABS: 150.0000 mg | ORAL_TABLET | Freq: Every day | ORAL | Status: DC

## 2015-05-01 MED ORDER — INSULIN ASPART 100 UNIT/ML ~~LOC~~ SOLN
SUBCUTANEOUS | Status: DC
Start: 2015-05-01 — End: 2015-05-01
  Filled 2015-05-01: qty 10

## 2015-05-01 MED ORDER — DIAZEPAM 2 MG PO TABS: 2.0000 mg | ORAL_TABLET | Freq: Three times a day (TID) | ORAL | Status: DC | PRN

## 2015-05-01 MED ORDER — KETOROLAC TROMETHAMINE 60 MG/2ML IM SOLN
60.0000 mg | Freq: Once | INTRAMUSCULAR | Status: AC
  Administered 2015-05-01: 60 mg via INTRAMUSCULAR
  Filled 2015-05-01: qty 2

## 2015-05-01 MED ORDER — METFORMIN HCL 500 MG PO TABS: 500.0000 mg | ORAL_TABLET | Freq: Two times a day (BID) | ORAL | Status: DC

## 2015-05-01 MED ORDER — INSULIN ASPART 100 UNIT/ML ~~LOC~~ SOLN
10.0000 [IU] | Freq: Once | SUBCUTANEOUS | Status: AC
  Administered 2015-05-01: 10 [IU] via SUBCUTANEOUS

## 2015-05-01 MED ORDER — INSULIN ASPART 100 UNIT/ML ~~LOC~~ SOLN
10.0000 [IU] | Freq: Once | SUBCUTANEOUS | Status: AC
  Administered 2015-05-01: 10 [IU] via SUBCUTANEOUS
  Filled 2015-05-01: qty 10

## 2015-05-01 MED ORDER — INSULIN ASPART 100 UNIT/ML ~~LOC~~ SOLN
SUBCUTANEOUS | Status: DC
Start: 2015-05-01 — End: 2015-05-01
  Filled 2015-05-01: qty 1

## 2015-05-01 MED ORDER — NAPROXEN 500 MG PO TABS: 500.0000 mg | ORAL_TABLET | Freq: Two times a day (BID) | ORAL | Status: DC

## 2015-05-01 NOTE — ED Provider Notes (Signed)
[facility] Emergency Department Provider Note  ____________________________________________  Time seen: Approximately 1:03 PM  I have reviewed the triage vital signs and the nursing notes.   HISTORY  Chief Complaint Arm Pain   HPI Chad BlissDon Matthew Martino Sr. is a 54 y.o. male is here with complaint of neck pain with radiation down his left arm. Patient states he has been using icy hot without any relief. He has been dealing with this for approximately 3 weeks. He denies any injury to his neck, he states he " woke up like this".He also has taken several over-the-counter medications without any relief. He states that range of motion sometimes improves his pain and he is been sleeping with his arm over his head to relieve some of the pressure. Currently he rates his pain 10 over 10.  Prior to discharge patient states that he needs a "pill for his yeast". This was not known to this examiner at the time he was evaluated. He states he is gotten prescription for this in the past in this emergency room. When asked what his blood sugars generally run he was unable to answer as he is "machine has broken". Fingerstick blood sugar was over 500. Patient then admitted that he had not taken any metformin and "weeks". He eventually admitted that he stopped taking insulin "long time ago". Patient states that he does not have a regular doctor but rather stops at "clinic to clinic" getting medication.   Past Medical History  Diagnosis Date  . Diabetes mellitus without complication (HCC)   . Hypertension     There are no active problems to display for this patient.   Past Surgical History  Procedure Laterality Date  . Colonoscopy      Current Outpatient Rx  Name  Route  Sig  Dispense  Refill  . Aspirin-Salicylamide-Caffeine (BC HEADACHE PO)   Oral   Take 1 packet by mouth 2 (two) times daily as needed (for pain).         . diazepam (VALIUM) 2 MG tablet   Oral   Take 1 tablet  (2 mg total) by mouth every 8 (eight) hours as needed for muscle spasms.   9 tablet   0   . fluconazole (DIFLUCAN) 150 MG tablet   Oral   Take 1 tablet (150 mg total) by mouth daily.   1 tablet   0   . ibuprofen (ADVIL,MOTRIN) 200 MG tablet   Oral   Take 400-500 mg by mouth every 6 (six) hours as needed for moderate pain.         . metFORMIN (GLUCOPHAGE) 500 MG tablet   Oral   Take 1 tablet (500 mg total) by mouth 2 (two) times daily with a meal.   60 tablet   1   . naproxen (NAPROSYN) 500 MG tablet   Oral   Take 1 tablet (500 mg total) by mouth 2 (two) times daily with a meal.   30 tablet   0   . traMADol (ULTRAM) 50 MG tablet   Oral   Take 1 tablet (50 mg total) by mouth every 6 (six) hours as needed. Patient not taking: Reported on 11/30/2014   15 tablet   0     Allergies Review of patient's allergies indicates no known allergies.  No family history on file.  Social History Social History  Substance Use Topics  . Smoking status: Current Every Day Smoker -- 0.00 packs/day    Types: Cigars  . Smokeless  tobacco: None  . Alcohol Use: No    Review of Systems Constitutional: No fever/chills Eyes: No visual changes. ENT: No sore throat. Cardiovascular: Denies chest pain. Respiratory: Denies shortness of breath. Gastrointestinal: No abdominal pain.  No nausea, no vomiting.  Genitourinary: Negative for dysuria. Musculoskeletal: Negative for back pain. Skin: Negative for rash. Neurological: Negative for headaches, focal weakness or numbness.  10-point ROS otherwise negative.  ____________________________________________   PHYSICAL EXAM:  VITAL SIGNS: ED Triage Vitals  Enc Vitals Group     BP 05/01/15 1244 142/91 mmHg     Pulse Rate 05/01/15 1243 102     Resp 05/01/15 1243 16     Temp 05/01/15 1243 98.1 F (36.7 C)     Temp Source 05/01/15 1243 Oral     SpO2 05/01/15 1243 97 %     Weight 05/01/15 1243 170 lb (77.111 kg)     Height 05/01/15 1243   (1.676 m)     Head Cir --      Peak Flow --      Pain Score 05/01/15 1243 10     Pain Loc --      Pain Edu? --      Excl. in GC? --     Constitutional: Alert and oriented. Well appearing and in no acute distress. Eyes: Conjunctivae are normal. PERRL. EOMI. Head: Atraumatic. Nose: No congestion/rhinnorhea. Neck: No stridor.  No point tenderness on palpation of the cervical spine however there is some tenderness on palpation of paravertebral muscles to the left. Range of motion is slightly restricted laterally secondary to discomfort. Cardiovascular: Normal rate, regular rhythm. Grossly normal heart sounds.  Good peripheral circulation. Respiratory: Normal respiratory effort.  No retractions. Lungs CTAB. Gastrointestinal: Soft and nontender. No distention. Musculoskeletal: No lower extremity tenderness nor edema.  No joint effusions. Neurologic:  Normal speech and language. No gross focal neurologic deficits are appreciated. No gait instability. Skin:  Skin is warm, dry and intact. No rash noted. Psychiatric: Mood and affect are normal. Speech and behavior are normal.  ____________________________________________   LABS (all labs ordered are listed, but only abnormal results are displayed)  Labs Reviewed  GLUCOSE, CAPILLARY - Abnormal; Notable for the following:    Glucose-Capillary 522 (*)    All other components within normal limits  COMPREHENSIVE METABOLIC PANEL - Abnormal; Notable for the following:    Glucose, Bld 548 (*)    Alkaline Phosphatase 132 (*)    All other components within normal limits  URINALYSIS COMPLETEWITH MICROSCOPIC ([facility] ONLY) - Abnormal; Notable for the following:    Color, Urine STRAW (*)    APPearance CLEAR (*)    Glucose, UA >500 (*)    Squamous Epithelial / LPF 0-5 (*)    All other components within normal limits  GLUCOSE, CAPILLARY - Abnormal; Notable for the following:    Glucose-Capillary 427 (*)    All other components within normal  limits  GLUCOSE, CAPILLARY - Abnormal; Notable for the following:    Glucose-Capillary 110 (*)    All other components within normal limits  CBC WITH DIFFERENTIAL/PLATELET  CBG MONITORING, ED  CBG MONITORING, ED     PROCEDURES  Procedure(s) performed: None  Critical Care performed: No  ____________________________________________   INITIAL IMPRESSION / ASSESSMENT AND PLAN / ED COURSE  Pertinent labs & imaging results that were available during my care of the patient were reviewed by me and considered in my medical decision making (see chart for details).  Initial glucose was reported  as 548 by the lab. No ketones were seen in the urine. Patient was started on IV normal saline with 10 units of insulin. Fingerstick glucose at 1702 was 427. Patient was told that he cannot stop his metformin and should find a doctor to help control his diabetes to prevent further damage to his body besides yeast. Patient also admits he has not adhered to a diabetic diet or sugar-free drinks. ____________________________________________   FINAL CLINICAL IMPRESSION(S) / ED DIAGNOSES  Final diagnoses:  Degenerative arthritis of cervical spine  Radicular pain in left arm  Uncontrolled type 2 diabetes mellitus without complication, without long-term current use of insulin (HCC)  Yeast infection of the skin      Tommi Rumps, PA-C 05/02/15 1017  Gayla Doss, MD 05/04/15 2227

## 2015-05-01 NOTE — ED Notes (Signed)
Patient states that he woke up 3 weeks ago with pain on the left side of his neck, pain radiates down the left arm. Patient has been using icyhot with no relief.

## 2015-05-01 NOTE — ED Notes (Signed)
Only 10 units of Novolog given subq.

## 2015-05-01 NOTE — ED Notes (Signed)
Pt reports left sided neck pain that radiates down into left arm x3 weeks, denies any obvious injury, Pt denies numbness or tingling.

## 2015-05-01 NOTE — ED Notes (Signed)
CBG 548, PA informed.

## 2015-05-01 NOTE — Discharge Instructions (Signed)
You absolutely have to begin taking your metformin twice a day every day to control your diabetes. You have also need to obtain a doctor for routine checks of your blood sugar and refills on your medication. You have continued to have yeast infections as long as you're blood sugar is out of control You need to call and make an appointment for recheck of your blood sugar You really need to be checking your blood sugars daily with a glucometer. To avoid complications adhere to a diabetic diet every day and take her medication. Diazepam 2 mg 3 times a day for 3 days for your neck discomfort along with naproxen for inflammation. Return to the emergency room if any severe worsening of your situation.

## 2015-06-25 ENCOUNTER — Emergency Department: Payer: Self-pay

## 2015-06-25 ENCOUNTER — Emergency Department
Admission: EM | Admit: 2015-06-25 | Discharge: 2015-06-25 | Disposition: A | Discharge status: Home / Self Care | Payer: Self-pay | Attending: Emergency Medicine | Admitting: Emergency Medicine

## 2015-06-25 ENCOUNTER — Encounter: Payer: Self-pay | Admitting: Medical Oncology

## 2015-06-25 DIAGNOSIS — Z791 Long term (current) use of non-steroidal anti-inflammatories (NSAID): Secondary | ICD-10-CM | POA: Insufficient documentation

## 2015-06-25 DIAGNOSIS — Z794 Long term (current) use of insulin: Secondary | ICD-10-CM | POA: Insufficient documentation

## 2015-06-25 DIAGNOSIS — I10 Essential (primary) hypertension: Secondary | ICD-10-CM | POA: Insufficient documentation

## 2015-06-25 DIAGNOSIS — R1012 Left upper quadrant pain: Secondary | ICD-10-CM | POA: Insufficient documentation

## 2015-06-25 DIAGNOSIS — Z7984 Long term (current) use of oral hypoglycemic drugs: Secondary | ICD-10-CM | POA: Insufficient documentation

## 2015-06-25 DIAGNOSIS — E1165 Type 2 diabetes mellitus with hyperglycemia: Secondary | ICD-10-CM | POA: Insufficient documentation

## 2015-06-25 DIAGNOSIS — B3749 Other urogenital candidiasis: Secondary | ICD-10-CM

## 2015-06-25 DIAGNOSIS — Z792 Long term (current) use of antibiotics: Secondary | ICD-10-CM | POA: Insufficient documentation

## 2015-06-25 DIAGNOSIS — B3742 Candidal balanitis: Secondary | ICD-10-CM | POA: Insufficient documentation

## 2015-06-25 DIAGNOSIS — F17200 Nicotine dependence, unspecified, uncomplicated: Secondary | ICD-10-CM | POA: Insufficient documentation

## 2015-06-25 DIAGNOSIS — R000 Tachycardia, unspecified: Secondary | ICD-10-CM | POA: Insufficient documentation

## 2015-06-25 LAB — GLUCOSE, CAPILLARY: GLUCOSE-CAPILLARY: 150 mg/dL — AB (ref 65–99)

## 2015-06-25 LAB — TROPONIN I

## 2015-06-25 LAB — CBC
HCT: 46.7 % (ref 40.0–52.0)
HEMOGLOBIN: 15.7 g/dL (ref 13.0–18.0)
MCH: 31.2 pg (ref 26.0–34.0)
MCHC: 33.7 g/dL (ref 32.0–36.0)
MCV: 92.6 fL (ref 80.0–100.0)
Platelets: 208 10*3/uL (ref 150–440)
RBC: 5.04 MIL/uL (ref 4.40–5.90)
RDW: 12.7 % (ref 11.5–14.5)
WBC: 7.5 10*3/uL (ref 3.8–10.6)

## 2015-06-25 LAB — BASIC METABOLIC PANEL
ANION GAP: 10 (ref 5–15)
BUN: 19 mg/dL (ref 6–20)
CALCIUM: 9.4 mg/dL (ref 8.9–10.3)
CO2: 23 mmol/L (ref 22–32)
Chloride: 97 mmol/L — ABNORMAL LOW (ref 101–111)
Creatinine, Ser: 1.29 mg/dL — ABNORMAL HIGH (ref 0.61–1.24)
GFR calc non Af Amer: >60 mL/min (ref >60)
Glucose, Bld: 893 mg/dL (ref 65–99)
Potassium: 4.3 mmol/L (ref 3.5–5.1)
Sodium: 130 mmol/L — ABNORMAL LOW (ref 135–145)

## 2015-06-25 MED ORDER — POTASSIUM CHLORIDE CRYS ER 20 MEQ PO TBCR
40.0000 meq | EXTENDED_RELEASE_TABLET | Freq: Once | ORAL | Status: AC
  Administered 2015-06-25: 40 meq via ORAL
  Filled 2015-06-25: qty 2

## 2015-06-25 MED ORDER — INSULIN ASPART 100 UNIT/ML ~~LOC~~ SOLN
10.0000 [IU] | Freq: Once | SUBCUTANEOUS | Status: AC
  Administered 2015-06-25: 10 [IU] via INTRAVENOUS
  Filled 2015-06-25: qty 10

## 2015-06-25 MED ORDER — RANITIDINE HCL 150 MG PO CAPS: 150.0000 mg | ORAL_CAPSULE | Freq: Two times a day (BID) | ORAL | Status: DC

## 2015-06-25 MED ORDER — FLUCONAZOLE 100 MG PO TABS: 200.0000 mg | ORAL_TABLET | Freq: Every day | ORAL | Status: AC

## 2015-06-25 MED ORDER — SODIUM CHLORIDE 0.9 % IV BOLUS (SEPSIS)
2000.0000 mL | Freq: Once | INTRAVENOUS | Status: AC
  Administered 2015-06-25: 2000 mL via INTRAVENOUS

## 2015-06-25 MED ORDER — METFORMIN HCL 500 MG PO TABS
500.0000 mg | ORAL_TABLET | Freq: Two times a day (BID) | ORAL | Status: DC
Start: 2015-06-25 — End: 2020-07-17

## 2015-06-25 MED ORDER — FAMOTIDINE 20 MG PO TABS
40.0000 mg | ORAL_TABLET | Freq: Once | ORAL | Status: AC
  Administered 2015-06-25: 40 mg via ORAL
  Filled 2015-06-25: qty 2

## 2015-06-25 NOTE — ED Notes (Signed)
Pt c/o of being very thirsty and frequent urination. Has not taken metformin as prescribed.

## 2015-06-25 NOTE — Discharge Instructions (Signed)
Balanitis Balanitis is inflammation of the head of the penis (glans).  CAUSES  Balanitis has multiple causes, both infectious and noninfectious. Often balanitis is the result of poor personal hygiene, especially in uncircumcised males. Without adequate washing, viruses, bacteria, and yeast collect between the foreskin and the glans. This can cause an infection. Lack of air and irritation from a normal secretion called smegma contribute to the cause in uncircumcised males. Other causes include:  Chemical irritation from the use of certain soaps and shower gels (especially soaps with perfumes), condoms, personal lubricants, petroleum jelly, spermicides, and fabric conditioners.  Skin conditions, such as eczema, dermatitis, and psoriasis.  Allergies to drugs, such as tetracycline and sulfa.  Certain medical conditions, including liver cirrhosis, congestive heart failure, and kidney disease.  Morbid obesity. RISK FACTORS  Diabetes mellitus.  A tight foreskin that is difficult to pull back past the glans (phimosis).  Sex without the use of a condom. SIGNS AND SYMPTOMS  Symptoms may include:  Discharge coming from under the foreskin.  Tenderness.  Itching and inability to get an erection (because of the pain).  Redness and a rash.  Sores on the glans and on the foreskin. DIAGNOSIS Diagnosis of balanitis is confirmed through a physical exam. TREATMENT The treatment is based on the cause of the balanitis. Treatment may include:  Frequent cleansing.  Keeping the glans and foreskin dry.  Use of medicines such as creams, pain medicines, antibiotics, or medicines to treat fungal infections.  Sitz baths. If the irritation has caused a scar on the foreskin that prevents easy retraction, a circumcision may be recommended.  HOME CARE INSTRUCTIONS  Sex should be avoided until the condition has cleared. MAKE SURE YOU:  Understand these instructions.  Will watch your  condition.  Will get help right away if you are not doing well or get worse.   This information is not intended to replace advice given to you by your health care provider. Make sure you discuss any questions you have with your health care provider.   Document Released: 11/08/2008 Document Revised: 06/27/2013 Document Reviewed: 12/12/2012 Elsevier Interactive Patient Education 2016 [employer]  Blood Glucose Monitoring, Adult Monitoring your blood glucose (also know as blood sugar) helps you to manage your diabetes. It also helps you and your health care provider monitor your diabetes and determine how well your treatment plan is working. WHY SHOULD YOU MONITOR YOUR BLOOD GLUCOSE?  It can help you understand how food, exercise, and medicine affect your blood glucose.  It allows you to know what your blood glucose is at any given moment. You can quickly tell if you are having low blood glucose (hypoglycemia) or high blood glucose (hyperglycemia).  It can help you and your health care provider know how to adjust your medicines.  It can help you understand how to manage an illness or adjust medicine for exercise. WHEN SHOULD YOU TEST? Your health care provider will help you decide how often you should check your blood glucose. This may depend on the type of diabetes you have, your diabetes control, or the types of medicines you are taking. Be sure to write down all of your blood glucose readings so that this information can be reviewed with your health care provider. See below for examples of testing times that your health care provider may suggest. Type 1 Diabetes  Test at least 2 times per day if your diabetes is well controlled, if you are using an insulin pump, or if you perform multiple  daily injections.  If your diabetes is not well controlled or if you are sick, you may need to test more often.  It is a good idea to also test:  Before every insulin injection.  Before and after  exercise.  Between meals and 2 hours after a meal.  Occasionally between 2:00 a.m. and 3:00 a.m. Type 2 Diabetes  If you are taking insulin, test at least 2 times per day. However, it is best to test before every insulin injection.  If you take medicines by mouth (orally), test 2 times a day.  If you are on a controlled diet, test once a day.  If your diabetes is not well controlled or if you are sick, you may need to monitor more often. HOW TO MONITOR YOUR BLOOD GLUCOSE Supplies Needed  Blood glucose meter.  Test strips for your meter. Each meter has its own strips. You must use the strips that go with your own meter.  A pricking needle (lancet).  A device that holds the lancet (lancing device).  A journal or log book to write down your results. Procedure  Wash your hands with soap and water. Alcohol is not preferred.  Prick the side of your finger (not the tip) with the lancet.  Gently milk the finger until a small drop of blood appears.  Follow the instructions that come with your meter for inserting the test strip, applying blood to the strip, and using your blood glucose meter. Other Areas to Get Blood for Testing Some meters allow you to use other areas of your body (other than your finger) to test your blood. These areas are called alternative sites. The most common alternative sites are:  The forearm.  The thigh.  The back area of the lower leg.  The palm of the hand. The blood flow in these areas is slower. Therefore, the blood glucose values you get may be delayed, and the numbers are different from what you would get from your fingers. Do not use alternative sites if you think you are having hypoglycemia. Your reading will not be accurate. Always use a finger if you are having hypoglycemia. Also, if you cannot feel your lows (hypoglycemia unawareness), always use your fingers for your blood glucose checks. ADDITIONAL TIPS FOR GLUCOSE MONITORING  Do not reuse  lancets.  Always carry your supplies with you.  All blood glucose meters have a 24-hour "hotline" number to call if you have questions or need help.  Adjust (calibrate) your blood glucose meter with a control solution after finishing a few boxes of strips. BLOOD GLUCOSE RECORD KEEPING It is a good idea to keep a daily record or log of your blood glucose readings. Most glucose meters, if not all, keep your glucose records stored in the meter. Some meters come with the ability to download your records to your home computer. Keeping a record of your blood glucose readings is especially helpful if you are wanting to look for patterns. Make notes to go along with the blood glucose readings because you might forget what happened at that exact time. Keeping good records helps you and your health care provider to work together to achieve good diabetes management.    This information is not intended to replace advice given to you by your health care provider. Make sure you discuss any questions you have with your health care provider.   Document Released: 06/25/2003 Document Revised: 07/13/2014 Document Reviewed: 11/14/2012 Elsevier Interactive Patient Education 2016 [employer].  Hyperglycemia  Hyperglycemia occurs when the glucose (sugar) in your blood is too high. Hyperglycemia can happen for many reasons, but it most often happens to people who do not know they have diabetes or are not managing their diabetes properly.  CAUSES  Whether you have diabetes or not, there are other causes of hyperglycemia. Hyperglycemia can occur when you have diabetes, but it can also occur in other situations that you might not be as aware of, such as: Diabetes  If you have diabetes and are having problems controlling your blood glucose, hyperglycemia could occur because of some of the following reasons:  Not following your meal plan.  Not taking your diabetes medications or not taking it properly.  Exercising  less or doing less activity than you normally do.  Being sick. Pre-diabetes  This cannot be ignored. Before people develop Type 2 diabetes, they almost always have "pre-diabetes." This is when your blood glucose levels are higher than normal, but not yet high enough to be diagnosed as diabetes. Research has shown that some long-term damage to the body, especially the heart and circulatory system, may already be occurring during pre-diabetes. If you take action to manage your blood glucose when you have pre-diabetes, you may delay or prevent Type 2 diabetes from developing. Stress  If you have diabetes, you may be "diet" controlled or on oral medications or insulin to control your diabetes. However, you may find that your blood glucose is higher than usual in the hospital whether you have diabetes or not. This is often referred to as "stress hyperglycemia." Stress can elevate your blood glucose. This happens because of hormones put out by the body during times of stress. If stress has been the cause of your high blood glucose, it can be followed regularly by your caregiver. That way he/she can make sure your hyperglycemia does not continue to get worse or progress to diabetes. Steroids  Steroids are medications that act on the infection fighting system (immune system) to block inflammation or infection. One side effect can be a rise in blood glucose. Most people can produce enough extra insulin to allow for this rise, but for those who cannot, steroids make blood glucose levels go even higher. It is not unusual for steroid treatments to "uncover" diabetes that is developing. It is not always possible to determine if the hyperglycemia will go away after the steroids are stopped. A special blood test called an A1c is sometimes done to determine if your blood glucose was elevated before the steroids were started. SYMPTOMS  Thirsty.  Frequent urination.  Dry mouth.  Blurred vision.  Tired or  fatigue.  Weakness.  Sleepy.  Tingling in feet or leg. DIAGNOSIS  Diagnosis is made by monitoring blood glucose in one or all of the following ways:  A1c test. This is a chemical found in your blood.  Fingerstick blood glucose monitoring.  Laboratory results. TREATMENT  First, knowing the cause of the hyperglycemia is important before the hyperglycemia can be treated. Treatment may include, but is not be limited to:  Education.  Change or adjustment in medications.  Change or adjustment in meal plan.  Treatment for an illness, infection, etc.  More frequent blood glucose monitoring.  Change in exercise plan.  Decreasing or stopping steroids.  Lifestyle changes. HOME CARE INSTRUCTIONS   Test your blood glucose as directed.  Exercise regularly. Your caregiver will give you instructions about exercise. Pre-diabetes or diabetes which comes on with stress is helped by exercising.  Eat wholesome,  balanced meals. Eat often and at regular, fixed times. Your caregiver or nutritionist will give you a meal plan to guide your sugar intake.  Being at an ideal weight is important. If needed, losing as little as 10 to 15 pounds may help improve blood glucose levels. SEEK MEDICAL CARE IF:   You have questions about medicine, activity, or diet.  You continue to have symptoms (problems such as increased thirst, urination, or weight gain). SEEK IMMEDIATE MEDICAL CARE IF:   You are vomiting or have diarrhea.  Your breath smells fruity.  You are breathing faster or slower.  You are very sleepy or incoherent.  You have numbness, tingling, or pain in your feet or hands.  You have chest pain.  Your symptoms get worse even though you have been following your caregiver's orders.  If you have any other questions or concerns.   This information is not intended to replace advice given to you by your health care provider. Make sure you discuss any questions you have with your  health care provider.   Document Released: 12/16/2000 Document Revised: 09/14/2011 Document Reviewed: 02/26/2015 Elsevier Interactive Patient Education 2016 [employer] Inc.  Type 2 Diabetes Mellitus, Adult Type 2 diabetes mellitus, often simply referred to as type 2 diabetes, is a long-lasting (chronic) disease. In type 2 diabetes, the pancreas does not make enough insulin (a hormone), the cells are less responsive to the insulin that is made (insulin resistance), or both. Normally, insulin moves sugars from food into the tissue cells. The tissue cells use the sugars for energy. The lack of insulin or the lack of normal response to insulin causes excess sugars to build up in the blood instead of going into the tissue cells. As a result, high blood sugar (hyperglycemia) develops. The effect of high sugar (glucose) levels can cause many complications. Type 2 diabetes was also previously called adult-onset diabetes, but it can occur at any age.  RISK FACTORS  A person is predisposed to developing type 2 diabetes if someone in the family has the disease and also has one or more of the following primary risk factors:  Weight gain, or being overweight or obese.  An inactive lifestyle.  A history of consistently eating high-calorie foods. Maintaining a normal weight and regular physical activity can reduce the chance of developing type 2 diabetes. SYMPTOMS  A person with type 2 diabetes may not show symptoms initially. The symptoms of type 2 diabetes appear slowly. The symptoms include:  Increased thirst (polydipsia).  Increased urination (polyuria).  Increased urination during the night (nocturia).  Sudden or unexplained weight changes.  Frequent, recurring infections.  Tiredness (fatigue).  Weakness.  Vision changes, such as blurred vision.  Fruity smell to your breath.  Abdominal pain.  Nausea or vomiting.  Cuts or bruises which are slow to heal.  Tingling or numbness in the  hands or feet.  An open skin wound (ulcer). DIAGNOSIS Type 2 diabetes is frequently not diagnosed until complications of diabetes are present. Type 2 diabetes is diagnosed when symptoms or complications are present and when blood glucose levels are increased. Your blood glucose level may be checked by one or more of the following blood tests:  A fasting blood glucose test. You will not be allowed to eat for at least 8 hours before a blood sample is taken.  A random blood glucose test. Your blood glucose is checked at any time of the day regardless of when you ate.  A hemoglobin A1c blood glucose  test. A hemoglobin A1c test provides information about blood glucose control over the previous 3 months.  An oral glucose tolerance test (OGTT). Your blood glucose is measured after you have not eaten (fasted) for 2 hours and then after you drink a glucose-containing beverage. TREATMENT   You may need to take insulin or diabetes medicine daily to keep blood glucose levels in the desired range.  If you use insulin, you may need to adjust the dosage depending on the carbohydrates that you eat with each meal or snack.  Lifestyle changes are recommended as part of your treatment. These may include:  Following an individualized diet plan developed by a nutritionist or dietitian.  Exercising daily. Your health care providers will set individualized treatment goals for you based on your age, your medicines, how long you have had diabetes, and any other medical conditions you have. Generally, the goal of treatment is to maintain the following blood glucose levels:  Before meals (preprandial): 80-130 mg/dL.  After meals (postprandial): below 180 mg/dL.  A1c: less than 6.5-7%. HOME CARE INSTRUCTIONS   Have your hemoglobin A1c level checked twice a year.  Perform daily blood glucose monitoring as directed by your health care provider.  Monitor urine ketones when you are ill and as directed by your  health care provider.  Take your diabetes medicine or insulin as directed by your health care provider to maintain your blood glucose levels in the desired range.  Never run out of diabetes medicine or insulin. It is needed every day.  If you are using insulin, you may need to adjust the amount of insulin given based on your intake of carbohydrates. Carbohydrates can raise blood glucose levels but need to be included in your diet. Carbohydrates provide vitamins, minerals, and fiber which are an essential part of a healthy diet. Carbohydrates are found in fruits, vegetables, whole grains, dairy products, legumes, and foods containing added sugars.  Eat healthy foods. You should make an appointment to see a registered dietitian to help you create an eating plan that is right for you.  Lose weight if you are overweight.  Carry a medical alert card or wear your medical alert jewelry.  Carry a 15-gram carbohydrate snack with you at all times to treat low blood glucose (hypoglycemia). Some examples of 15-gram carbohydrate snacks include:  Glucose tablets, 3 or 4.  Glucose gel, 15-gram tube.  Raisins, 2 tablespoons (24 grams).  Jelly beans, 6.  Animal crackers, 8.  Regular pop, 4 ounces (120 mL).  Gummy treats, 9.  Recognize hypoglycemia. Hypoglycemia occurs with blood glucose levels of 70 mg/dL and below. The risk for hypoglycemia increases when fasting or skipping meals, during or after intense exercise, and during sleep. Hypoglycemia symptoms can include:  Tremors or shakes.  Decreased ability to concentrate.  Sweating.  Increased heart rate.  Headache.  Dry mouth.  Hunger.  Irritability.  Anxiety.  Restless sleep.  Altered speech or coordination.  Confusion.  Treat hypoglycemia promptly. If you are alert and able to safely swallow, follow the 15:15 rule:  Take 15-20 grams of rapid-acting glucose or carbohydrate. Rapid-acting options include glucose gel, glucose  tablets, or 4 ounces (120 mL) of fruit juice, regular soda, or low-fat milk.  Check your blood glucose level 15 minutes after taking the glucose.  Take 15-20 grams more of glucose if the repeat blood glucose level is still 70 mg/dL or below.  Eat a meal or snack within 1 hour once blood glucose levels return to normal.  Be alert to feeling very thirsty and urinating more frequently than usual, which are early signs of hyperglycemia. An early awareness of hyperglycemia allows for prompt treatment. Treat hyperglycemia as directed by your health care provider.  Engage in at least 150 minutes of moderate-intensity physical activity a week, spread over at least 3 days of the week or as directed by your health care provider. In addition, you should engage in resistance exercise at least 2 times a week or as directed by your health care provider. Try to spend no more than 90 minutes at one time inactive.  Adjust your medicine and food intake as needed if you start a new exercise or sport.  Follow your sick-day plan anytime you are unable to eat or drink as usual.  Do not use any tobacco products including cigarettes, chewing tobacco, or electronic cigarettes. If you need help quitting, ask your health care provider.  Limit alcohol intake to no more than 1 drink per day for nonpregnant women and 2 drinks per day for men. You should drink alcohol only when you are also eating food. Talk with your health care provider whether alcohol is safe for you. Tell your health care provider if you drink alcohol several times a week.  Keep all follow-up visits as directed by your health care provider. This is important.  Schedule an eye exam soon after the diagnosis of type 2 diabetes and then annually.  Perform daily skin and foot care. Examine your skin and feet daily for cuts, bruises, redness, nail problems, bleeding, blisters, or sores. A foot exam by a health care provider should be done annually.  Brush  your teeth and gums at least twice a day and floss at least once a day. Follow up with your dentist regularly.  Share your diabetes management plan with your workplace or school.  Keep your immunizations up to date. It is recommended that you receive a flu (influenza) vaccine every year. It is also recommended that you receive a pneumonia (pneumococcal) vaccine. If you are 70 years of age or older and have never received a pneumonia vaccine, this vaccine may be given as a series of two separate shots. Ask your health care provider which additional vaccines may be recommended.  Learn to manage stress.  Obtain ongoing diabetes education and support as needed.  Participate in or seek rehabilitation as needed to maintain or improve independence and quality of life. Request a physical or occupational therapy referral if you are having foot or hand numbness, or difficulties with grooming, dressing, eating, or physical activity. SEEK MEDICAL CARE IF:   You are unable to eat food or drink fluids for more than 6 hours.  You have nausea and vomiting for more than 6 hours.  Your blood glucose level is over 240 mg/dL.  There is a change in mental status.  You develop an additional serious illness.  You have diarrhea for more than 6 hours.  You have been sick or have had a fever for a couple of days and are not getting better.  You have pain during any physical activity.  SEEK IMMEDIATE MEDICAL CARE IF:  You have difficulty breathing.  You have moderate to large ketone levels.   This information is not intended to replace advice given to you by your health care provider. Make sure you discuss any questions you have with your health care provider.   Document Released: 06/22/2005 Document Revised: 03/13/2015 Document Reviewed: 01/19/2012 Elsevier Interactive Patient Education [employer]

## 2015-06-25 NOTE — ED Provider Notes (Signed)
[facility] Emergency Department Provider Note  ____________________________________________  Time seen: 6:10 PM  I have reviewed the triage vital signs and the nursing notes.   HISTORY  Chief Complaint Chest Pain    HPI Chad Deviney Sr. is a 54 y.o. male who complains of chest pain that started last night. It was constant for a few hours, tightness in the central chest, nonradiating, no shortness of breath diaphoresis or vomiting. No dizziness or syncope. Not exertional, not pleuritic. No recent illness except for a nonproductive cough.  On arrival patient is found to have a severely elevated blood sugar. He does report that he takes metformin for diabetes, but after his sister passed away around 01-01-25he has been forgetting to take his medicine for the past 2 weeks.     Past Medical History  Diagnosis Date  . Diabetes mellitus without complication (HCC)   . Hypertension      There are no active problems to display for this patient.    Past Surgical History  Procedure Laterality Date  . Colonoscopy       Current Outpatient Rx  Name  Route  Sig  Dispense  Refill  . Aspirin-Salicylamide-Caffeine (BC HEADACHE PO)   Oral   Take 1 packet by mouth 2 (two) times daily as needed (for pain).         Marland Kitchen ibuprofen (ADVIL,MOTRIN) 200 MG tablet   Oral   Take 400-500 mg by mouth every 6 (six) hours as needed for moderate pain.         . diazepam (VALIUM) 2 MG tablet   Oral   Take 1 tablet (2 mg total) by mouth every 8 (eight) hours as needed for muscle spasms.   9 tablet   0   . fluconazole (DIFLUCAN) 100 MG tablet   Oral   Take 2 tablets (200 mg total) by mouth daily.   2 tablet   0   . metFORMIN (GLUCOPHAGE) 500 MG tablet   Oral   Take 1 tablet (500 mg total) by mouth 2 (two) times daily with a meal.   60 tablet   2   . naproxen (NAPROSYN) 500 MG tablet   Oral   Take 1 tablet (500 mg total) by mouth 2 (two) times daily  with a meal.   30 tablet   0   . traMADol (ULTRAM) 50 MG tablet   Oral   Take 1 tablet (50 mg total) by mouth every 6 (six) hours as needed. Patient not taking: Reported on 11/30/2014   15 tablet   0      Allergies Review of patient's allergies indicates no known allergies.   No family history on file.  Social History Social History  Substance Use Topics  . Smoking status: Current Every Day Smoker -- 0.00 packs/day    Types: Cigars  . Smokeless tobacco: None  . Alcohol Use: No    Review of Systems  Constitutional:   No fever or chills. No weight changes Eyes:   No blurry vision or double vision.  ENT:   No sore throat. Cardiovascular:   positivechest pain. Respiratory:   No dyspnea positivecough. Gastrointestinal:   Negative for abdominal pain, vomiting and diarrhea.  No BRBPR or melena. Genitourinary:   Negative for dysuria, urinary retention, bloody urine, or difficulty urinating. Musculoskeletal:   Negative for back pain. No joint swelling or pain. Skin:   Negative for rash. Neurological:   Negative for headaches, focal weakness or  numbness. Psychiatric:  No anxiety or depression.   Endocrine:  No hot/cold intolerance, changes in energy, or sleep difficulty.  10-point ROS otherwise negative.  ____________________________________________   PHYSICAL EXAM:  VITAL SIGNS: ED Triage Vitals  Enc Vitals Group     BP 06/25/15 1656 140/89 mmHg     Pulse Rate 06/25/15 1656 110     Resp 06/25/15 1656 20     Temp 06/25/15 1656 98.2 F (36.8 C)     Temp Source 06/25/15 1656 Oral     SpO2 06/25/15 1656 98 %     Weight 06/25/15 1656 170 lb (77.111 kg)     Height 06/25/15 1656 5\' 6"  (1.676 m)     Head Cir --      Peak Flow --      Pain Score 06/25/15 1657 8     Pain Loc --      Pain Edu? --      Excl. in GC? --     Vital signs reviewed, nursing assessments reviewed.   Constitutional:   Alert and oriented. Well appearing and in no distress. Eyes:   No  scleral icterus. No conjunctival pallor. PERRL. EOMI ENT   Head:   Normocephalic and atraumatic.   Nose:   No congestion/rhinnorhea. No septal hematoma   Mouth/Throat:   Dry mucous membranes, positivepharyngeal erythema. No peritonsillar mass. No uvula shift.   Neck:   No stridor. No SubQ emphysema. No meningismus. Hematological/Lymphatic/Immunilogical:   No cervical lymphadenopathy. Cardiovascular:   Tachycardia heart rate 110. Normal and symmetric distal pulses are present in all extremities. No murmurs, rubs, or gallops. Respiratory:   Normal respiratory effort without tachypnea nor retractions. Breath sounds are clear and equal bilaterally. No wheezes/rales/rhonchi. Gastrointestinal:   Soft with left upper quadrant tenderness. No distention. There is no CVA tenderness.  No rebound, rigidity, or guarding. Genitourinary:   deferred Musculoskeletal:   Nontender with normal range of motion in all extremities. No joint effusions.  No lower extremity tenderness.  No edema. Neurologic:   Normal speech and language.  CN 2-10 normal. Motor grossly intact. No pronator drift.  Normal gait. No gross focal neurologic deficits are appreciated.  Skin:    Skin is warm, dry and intact. No rash noted.  No petechiae, purpura, or bullae. Psychiatric:   Mood and affect are normal. Speech and behavior are normal. Patient exhibits appropriate insight and judgment.  ____________________________________________    LABS (pertinent positives/negatives) (all labs ordered are listed, but only abnormal results are displayed) Labs Reviewed  BASIC METABOLIC PANEL - Abnormal; Notable for the following:    Sodium 130 (*)    Chloride 97 (*)    Glucose, Bld 893 (*)    Creatinine, Ser 1.29 (*)    All other components within normal limits  GLUCOSE, CAPILLARY - Abnormal; Notable for the following:    Glucose-Capillary >600 (*)    All other components within normal limits  GLUCOSE, CAPILLARY - Abnormal;  Notable for the following:    Glucose-Capillary 150 (*)    All other components within normal limits  CBC  TROPONIN I  CBG MONITORING, ED  CBG MONITORING, ED   ____________________________________________   EKG  Interpreted by me Sinus tachycardia rate 111, normal axis intervals QRS and ST segments and T waves  ____________________________________________    RADIOLOGY    ____________________________________________   PROCEDURES   ____________________________________________   INITIAL IMPRESSION / ASSESSMENT AND PLAN / ED COURSE  Pertinent labs & imaging results that were available  during my care of the patient were reviewed by me and considered in my medical decision making (see chart for details).  She presents with hyperglycemia related to medication noncompliance and recent social stressors.blood sugar on chemistry is 893. We'll give 2 L of saline and 10 units insulin and reassessed. He is otherwise very well appearing.given clinical exam findings,chest pain and cough are likely related to acid reflux. We'll plan to start the patient on antiacids.  ----------------------------------------- 7:39 PM on 06/25/2015 -----------------------------------------  After 10 units of insulin and 1 L of saline, fingerstick still greater than 600. We'll repeat insulin dose and get some oral potassium and recheck after second liter of saline.  ----------------------------------------- 9:34 PM on 06/25/2015 -----------------------------------------  Vital signs stable. Repeat blood sugar now 150. Patient feels well, we'll discharge home. I'll refill his metformin, give him a dose of Diflucan for balanitis.   ____________________________________________   FINAL CLINICAL IMPRESSION(S) / ED DIAGNOSES  Final diagnoses:  Type 2 diabetes mellitus with hyperglycemia, without long-term current use of insulin (HCC)  Candidal balanoposthitis      Sharman Cheek, MD 06/25/15  2134

## 2015-06-25 NOTE — ED Notes (Signed)
Chest pain x 2 days, worsened last night. Also reports cough.

## 2015-10-10 ENCOUNTER — Emergency Department
Admission: EM | Admit: 2015-10-10 | Discharge: 2015-10-10 | Disposition: A | Discharge status: Home / Self Care | Payer: Self-pay | Attending: Emergency Medicine | Admitting: Emergency Medicine

## 2015-10-10 ENCOUNTER — Encounter: Payer: Self-pay | Admitting: Urgent Care

## 2015-10-10 DIAGNOSIS — F17210 Nicotine dependence, cigarettes, uncomplicated: Secondary | ICD-10-CM | POA: Insufficient documentation

## 2015-10-10 DIAGNOSIS — Z79899 Other long term (current) drug therapy: Secondary | ICD-10-CM | POA: Insufficient documentation

## 2015-10-10 DIAGNOSIS — Z7984 Long term (current) use of oral hypoglycemic drugs: Secondary | ICD-10-CM | POA: Insufficient documentation

## 2015-10-10 DIAGNOSIS — I10 Essential (primary) hypertension: Secondary | ICD-10-CM | POA: Insufficient documentation

## 2015-10-10 DIAGNOSIS — Z794 Long term (current) use of insulin: Secondary | ICD-10-CM | POA: Insufficient documentation

## 2015-10-10 DIAGNOSIS — E1165 Type 2 diabetes mellitus with hyperglycemia: Secondary | ICD-10-CM | POA: Insufficient documentation

## 2015-10-10 LAB — DIFFERENTIAL
BASOS ABS: 0.3 10*3/uL — AB (ref 0–0.1)
Band Neutrophils: 0 %
Basophils Relative: 3 %
Blasts: 0 %
EOS ABS: 0.2 10*3/uL (ref 0–0.7)
EOS PCT: 2 %
LYMPHS PCT: 31 %
Lymphs Abs: 2.8 10*3/uL (ref 1.0–3.6)
MONO ABS: 0.5 10*3/uL (ref 0.2–1.0)
MONOS PCT: 6 %
Metamyelocytes Relative: 0 %
Myelocytes: 0 %
NEUTROS PCT: 58 %
Neutro Abs: 5.3 10*3/uL (ref 1.4–6.5)
Other: 0 %
Promyelocytes Absolute: 0 %
nRBC: 0 /100 WBC

## 2015-10-10 LAB — GLUCOSE, CAPILLARY
GLUCOSE-CAPILLARY: 298 mg/dL — AB (ref 65–99)
Glucose-Capillary: 521 mg/dL — ABNORMAL HIGH (ref 65–99)
Glucose-Capillary: 236 mg/dL — ABNORMAL HIGH (ref 65–99)

## 2015-10-10 LAB — BASIC METABOLIC PANEL
BUN: 10 mg/dL (ref 6–20)
CHLORIDE: 98 mmol/L — AB (ref 101–111)
CO2: 26 mmol/L (ref 22–32)
Calcium: 9.4 mg/dL (ref 8.9–10.3)
Creatinine, Ser: 1.35 mg/dL — ABNORMAL HIGH (ref 0.61–1.24)
GFR calc Af Amer: >60 mL/min (ref >60)
GFR calc non Af Amer: 58 mL/min — ABNORMAL LOW (ref >60)
Glucose, Bld: 519 mg/dL (ref 65–99)
POTASSIUM: 3.7 mmol/L (ref 3.5–5.1)
SODIUM: UNDETERMINED mmol/L (ref 135–145)

## 2015-10-10 LAB — BLOOD GAS, VENOUS
ACID-BASE EXCESS: 1.2 mmol/L (ref 0.0–3.0)
BICARBONATE: 26.6 meq/L (ref 21.0–28.0)
O2 Saturation: 83 %
PCO2 VEN: 44 mmHg (ref 44.0–60.0)
PH VEN: 7.39 (ref 7.320–7.430)
PO2 VEN: 48 mmHg — AB (ref 31.0–45.0)
Patient temperature: 37

## 2015-10-10 LAB — URINALYSIS COMPLETE WITH MICROSCOPIC (ARMC ONLY)
Bacteria, UA: NONE SEEN
Bilirubin Urine: NEGATIVE
Glucose, UA: >500 mg/dL — AB
Hgb urine dipstick: NEGATIVE
KETONES UR: NEGATIVE mg/dL
Nitrite: NEGATIVE
PH: 7 (ref 5.0–8.0)
PROTEIN: NEGATIVE mg/dL
SPECIFIC GRAVITY, URINE: 1.03 (ref 1.005–1.030)

## 2015-10-10 LAB — CBC
HEMATOCRIT: 43.6 % (ref 40.0–52.0)
Hemoglobin: 15.7 g/dL (ref 13.0–18.0)
MCH: 31.9 pg (ref 26.0–34.0)
MCHC: 36 g/dL (ref 32.0–36.0)
MCV: 88.5 fL (ref 80.0–100.0)
PLATELETS: 213 10*3/uL (ref 150–440)
RBC: 4.93 MIL/uL (ref 4.40–5.90)
RDW: 13 % (ref 11.5–14.5)
WBC: 9.1 10*3/uL (ref 3.8–10.6)

## 2015-10-10 MED ORDER — SODIUM CHLORIDE 0.9 % IV BOLUS (SEPSIS)
1000.0000 mL | Freq: Once | INTRAVENOUS | Status: AC
  Administered 2015-10-10: 1000 mL via INTRAVENOUS

## 2015-10-10 MED ORDER — METFORMIN HCL 1000 MG PO TABS: 1000.0000 mg | ORAL_TABLET | Freq: Two times a day (BID) | ORAL | Status: DC

## 2015-10-10 MED ORDER — INSULIN ASPART 100 UNIT/ML ~~LOC~~ SOLN
8.0000 [IU] | Freq: Once | SUBCUTANEOUS | Status: AC
  Administered 2015-10-10: 8 [IU] via INTRAVENOUS

## 2015-10-10 MED ORDER — FLUCONAZOLE 50 MG PO TABS
150.0000 mg | ORAL_TABLET | Freq: Once | ORAL | Status: AC
  Administered 2015-10-10: 150 mg via ORAL
  Filled 2015-10-10: qty 1

## 2015-10-10 NOTE — ED Notes (Signed)
VBG sample obtained and taken to respiratory department for processing per MD order. Patient also wanting to discuss getting "a dose of Diflucan" while he is here because when his sugars "go high" he gets a yeast infection.

## 2015-10-10 NOTE — Discharge Instructions (Signed)
Hyperglycemia °Hyperglycemia occurs when the glucose (sugar) in your blood is too high. Hyperglycemia can happen for many reasons, but it most often happens to people who do not know they have diabetes or are not managing their diabetes properly.  °CAUSES  °Whether you have diabetes or not, there are other causes of hyperglycemia. Hyperglycemia can occur when you have diabetes, but it can also occur in other situations that you might not be as aware of, such as: °Diabetes °· If you have diabetes and are having problems controlling your blood glucose, hyperglycemia could occur because of some of the following reasons: °¨ Not following your meal plan. °¨ Not taking your diabetes medications or not taking it properly. °¨ Exercising less or doing less activity than you normally do. °¨ Being sick. °Pre-diabetes °· This cannot be ignored. Before people develop Type 2 diabetes, they almost always have "pre-diabetes." This is when your blood glucose levels are higher than normal, but not yet high enough to be diagnosed as diabetes. Research has shown that some long-term damage to the body, especially the heart and circulatory system, may already be occurring during pre-diabetes. If you take action to manage your blood glucose when you have pre-diabetes, you may delay or prevent Type 2 diabetes from developing. °Stress °· If you have diabetes, you may be "diet" controlled or on oral medications or insulin to control your diabetes. However, you may find that your blood glucose is higher than usual in the hospital whether you have diabetes or not. This is often referred to as "stress hyperglycemia." Stress can elevate your blood glucose. This happens because of hormones put out by the body during times of stress. If stress has been the cause of your high blood glucose, it can be followed regularly by your caregiver. That way he/she can make sure your hyperglycemia does not continue to get worse or progress to  diabetes. °Steroids °· Steroids are medications that act on the infection fighting system (immune system) to block inflammation or infection. One side effect can be a rise in blood glucose. Most people can produce enough extra insulin to allow for this rise, but for those who cannot, steroids make blood glucose levels go even higher. It is not unusual for steroid treatments to "uncover" diabetes that is developing. It is not always possible to determine if the hyperglycemia will go away after the steroids are stopped. A special blood test called an A1c is sometimes done to determine if your blood glucose was elevated before the steroids were started. °SYMPTOMS °· Thirsty. °· Frequent urination. °· Dry mouth. °· Blurred vision. °· Tired or fatigue. °· Weakness. °· Sleepy. °· Tingling in feet or leg. °DIAGNOSIS  °Diagnosis is made by monitoring blood glucose in one or all of the following ways: °· A1c test. This is a chemical found in your blood. °· Fingerstick blood glucose monitoring. °· Laboratory results. °TREATMENT  °First, knowing the cause of the hyperglycemia is important before the hyperglycemia can be treated. Treatment may include, but is not be limited to: °· Education. °· Change or adjustment in medications. °· Change or adjustment in meal plan. °· Treatment for an illness, infection, etc. °· More frequent blood glucose monitoring. °· Change in exercise plan. °· Decreasing or stopping steroids. °· Lifestyle changes. °HOME CARE INSTRUCTIONS  °· Test your blood glucose as directed. °· Exercise regularly. Your caregiver will give you instructions about exercise. Pre-diabetes or diabetes which comes on with stress is helped by exercising. °· Eat wholesome,   balanced meals. Eat often and at regular, fixed times. Your caregiver or nutritionist will give you a meal plan to guide your sugar intake. °· Being at an ideal weight is important. If needed, losing as little as 10 to 15 pounds may help improve blood  glucose levels. °SEEK MEDICAL CARE IF:  °· You have questions about medicine, activity, or diet. °· You continue to have symptoms (problems such as increased thirst, urination, or weight gain). °SEEK IMMEDIATE MEDICAL CARE IF:  °· You are vomiting or have diarrhea. °· Your breath smells fruity. °· You are breathing faster or slower. °· You are very sleepy or incoherent. °· You have numbness, tingling, or pain in your feet or hands. °· You have chest pain. °· Your symptoms get worse even though you have been following your caregiver's orders. °· If you have any other questions or concerns. °  °This information is not intended to replace advice given to you by your health care provider. Make sure you discuss any questions you have with your health care provider. °  °Document Released: 12/16/2000 Document Revised: 09/14/2011 Document Reviewed: 02/26/2015 °Elsevier Interactive Patient Education ©2016 [employer] ° °

## 2015-10-10 NOTE — ED Provider Notes (Signed)
[facility] Emergency Department Provider Note  ____________________________________________  Time seen: 4:00 AM  I have reviewed the triage vital signs and the nursing notes.   HISTORY  Chief Complaint Hyperglycemia     HPI Chad Maffei Sr. is a 55 y.o. male history of type 2 diabetes prescribed Metformin however has been noncompliant with metformin times approximately 2 months presents to the emergency department with polyuria polydipsia. Glucose on presentation read critical high. Patient denies any recent illness     Past Medical History  Diagnosis Date  . Diabetes mellitus without complication (HCC)   . Hypertension     There are no active problems to display for this patient.   Past Surgical History  Procedure Laterality Date  . Colonoscopy    . Femur closed reduction      Current Outpatient Rx  Name  Route  Sig  Dispense  Refill  . Aspirin-Salicylamide-Caffeine (BC HEADACHE PO)   Oral   Take 1 packet by mouth 2 (two) times daily as needed (for pain).         . diazepam (VALIUM) 2 MG tablet   Oral   Take 1 tablet (2 mg total) by mouth every 8 (eight) hours as needed for muscle spasms.   9 tablet   0   . ibuprofen (ADVIL,MOTRIN) 200 MG tablet   Oral   Take 400-500 mg by mouth every 6 (six) hours as needed for moderate pain.         . metFORMIN (GLUCOPHAGE) 1000 MG tablet   Oral   Take 1 tablet (1,000 mg total) by mouth 2 (two) times daily with a meal.   60 tablet   11   . metFORMIN (GLUCOPHAGE) 500 MG tablet   Oral   Take 1 tablet (500 mg total) by mouth 2 (two) times daily with a meal.   60 tablet   2   . naproxen (NAPROSYN) 500 MG tablet   Oral   Take 1 tablet (500 mg total) by mouth 2 (two) times daily with a meal.   30 tablet   0   . ranitidine (ZANTAC) 150 MG capsule   Oral   Take 1 capsule (150 mg total) by mouth 2 (two) times daily.   28 capsule   0   . traMADol (ULTRAM) 50 MG tablet   Oral    Take 1 tablet (50 mg total) by mouth every 6 (six) hours as needed. Patient not taking: Reported on 11/30/2014   15 tablet   0     Allergies No known drug allergies No family history on file.  Social History Social History  Substance Use Topics  . Smoking status: Current Every Day Smoker -- 0.00 packs/day    Types: Cigars  . Smokeless tobacco: None  . Alcohol Use: No    Review of Systems  Constitutional: Negative for fever. Eyes: Negative for visual changes. ENT: Negative for sore throat. Cardiovascular: Negative for chest pain. Respiratory: Negative for shortness of breath. Gastrointestinal: Negative for abdominal pain, vomiting and diarrhea. Genitourinary: Negative for dysuria. Musculoskeletal: Negative for back pain. Skin: Negative for rash. Neurological: Negative for headaches, focal weakness or numbness.   10-point ROS otherwise negative.  ____________________________________________   PHYSICAL EXAM:  VITAL SIGNS: ED Triage Vitals  Enc Vitals Group     BP 10/10/15 0209 170/112 mmHg     Pulse Rate 10/10/15 0209 92     Resp 10/10/15 0209 20     Temp 10/10/15 0209 98.9  F (37.2 C)     Temp Source 10/10/15 0209 Oral     SpO2 10/10/15 0209 100 %     Weight 10/10/15 0209 175 lb (79.379 kg)     Height 10/10/15 0209 5\' 6"  (1.676 m)     Head Cir --      Peak Flow --      Pain Score 10/10/15 0209 0     Pain Loc --      Pain Edu? --      Excl. in GC? --      Constitutional: Alert and oriented. Well appearing and in no distress. Eyes: Conjunctivae are normal. PERRL. Normal extraocular movements. ENT   Head: Normocephalic and atraumatic.   Nose: No congestion/rhinnorhea.   Mouth/Throat: Mucous membranes are moist.   Neck: No stridor. Hematological/Lymphatic/Immunilogical: No cervical lymphadenopathy. Cardiovascular: Normal rate, regular rhythm. Normal and symmetric distal pulses are present in all extremities. No murmurs, rubs, or  gallops. Respiratory: Normal respiratory effort without tachypnea nor retractions. Breath sounds are clear and equal bilaterally. No wheezes/rales/rhonchi. Gastrointestinal: Soft and nontender. No distention. There is no CVA tenderness. Genitourinary: deferred Musculoskeletal: Nontender with normal range of motion in all extremities. No joint effusions.  No lower extremity tenderness nor edema. Neurologic:  Normal speech and language. No gross focal neurologic deficits are appreciated. Speech is normal.  Skin:  Skin is warm, dry and intact. No rash noted. Psychiatric: Mood and affect are normal. Speech and behavior are normal. Patient exhibits appropriate insight and judgment.  ____________________________________________    LABS (pertinent positives/negatives)  Labs Reviewed  GLUCOSE, CAPILLARY - Abnormal; Notable for the following:    Glucose-Capillary 521 (*)    All other components within normal limits  BASIC METABOLIC PANEL - Abnormal; Notable for the following:    Chloride 98 (*)    Glucose, Bld 519 (*)    Creatinine, Ser 1.35 (*)    GFR calc non Af Amer 58 (*)    All other components within normal limits  URINALYSIS COMPLETEWITH MICROSCOPIC ([facility] ONLY) - Abnormal; Notable for the following:    Color, Urine STRAW (*)    APPearance CLEAR (*)    Glucose, UA >500 (*)    Leukocytes, UA 2+ (*)    Squamous Epithelial / LPF 0-5 (*)    All other components within normal limits  DIFFERENTIAL - Abnormal; Notable for the following:    Basophils Absolute 0.3 (*)    All other components within normal limits  GLUCOSE, CAPILLARY - Abnormal; Notable for the following:    Glucose-Capillary 298 (*)    All other components within normal limits  BLOOD GAS, VENOUS - Abnormal; Notable for the following:    pO2, Ven 48.0 (*)    All other components within normal limits  GLUCOSE, CAPILLARY - Abnormal; Notable for the following:    Glucose-Capillary 236 (*)    All other components within  normal limits  CBC  CBG MONITORING, ED  CBG MONITORING, ED        INITIAL IMPRESSION / ASSESSMENT AND PLAN / ED COURSE  Pertinent labs & imaging results that were available during my care of the patient were reviewed by me and considered in my medical decision making (see chart for details).  Patient received 2 L IV normal saline as well as insulin 8 units IV with resultant glucose 236. Patient prescribed Metformin and counseled at length regarding dangers of being noncompliant with anti-glycemic medications.  ____________________________________________   FINAL CLINICAL IMPRESSION(S) / ED DIAGNOSES  Final diagnoses:  Type 2 diabetes mellitus with hyperglycemia, without long-term current use of insulin (HCC)      Darci Current, MD 10/10/15 [phone]

## 2015-10-10 NOTE — ED Notes (Signed)
MD made aware of repeat CBG and the lab's inability to calculate the anion gap secondary to lipemic sample. MD with VORB for NS x 1 liter bolus (total of 2 liters) and a VBG. Orders to be entered and carried by this RN.

## 2015-10-10 NOTE — ED Notes (Signed)
Patient presents with reports of his CBGs being high; only checks CBGs every once in awhile. Did check CBG yesterday and it was CRITICAL HIGH; same tonight. (+) polyuria, Polydipsia, and Polyphagia. Patient with dry mouth. Patient has not taken his Metformin a month and a half.

## 2017-11-07 ENCOUNTER — Other Ambulatory Visit: Payer: Self-pay

## 2017-11-07 ENCOUNTER — Emergency Department
Admission: EM | Admit: 2017-11-07 | Discharge: 2017-11-07 | Disposition: A | Discharge status: Home / Self Care | Payer: Self-pay | Attending: Emergency Medicine | Admitting: Emergency Medicine

## 2017-11-07 DIAGNOSIS — E1165 Type 2 diabetes mellitus with hyperglycemia: Secondary | ICD-10-CM | POA: Insufficient documentation

## 2017-11-07 DIAGNOSIS — R739 Hyperglycemia, unspecified: Secondary | ICD-10-CM

## 2017-11-07 DIAGNOSIS — Z79899 Other long term (current) drug therapy: Secondary | ICD-10-CM | POA: Insufficient documentation

## 2017-11-07 DIAGNOSIS — F17210 Nicotine dependence, cigarettes, uncomplicated: Secondary | ICD-10-CM | POA: Insufficient documentation

## 2017-11-07 DIAGNOSIS — Z7984 Long term (current) use of oral hypoglycemic drugs: Secondary | ICD-10-CM | POA: Insufficient documentation

## 2017-11-07 DIAGNOSIS — I10 Essential (primary) hypertension: Secondary | ICD-10-CM | POA: Insufficient documentation

## 2017-11-07 LAB — URINALYSIS, COMPLETE (UACMP) WITH MICROSCOPIC
BILIRUBIN URINE: NEGATIVE
Glucose, UA: >=500 mg/dL — AB
Hgb urine dipstick: NEGATIVE
Ketones, ur: NEGATIVE mg/dL
Nitrite: NEGATIVE
PH: 6 (ref 5.0–8.0)
Protein, ur: NEGATIVE mg/dL
Specific Gravity, Urine: 1.022 (ref 1.005–1.030)

## 2017-11-07 LAB — BASIC METABOLIC PANEL
ANION GAP: 9 (ref 5–15)
BUN: 11 mg/dL (ref 6–20)
CALCIUM: 9.2 mg/dL (ref 8.9–10.3)
CO2: 24 mmol/L (ref 22–32)
Chloride: 96 mmol/L — ABNORMAL LOW (ref 101–111)
Creatinine, Ser: 1.15 mg/dL (ref 0.61–1.24)
Glucose, Bld: 719 mg/dL (ref 65–99)
Potassium: 3.8 mmol/L (ref 3.5–5.1)
SODIUM: 129 mmol/L — AB (ref 135–145)

## 2017-11-07 LAB — GLUCOSE, CAPILLARY
Glucose-Capillary: 282 mg/dL — ABNORMAL HIGH (ref 65–99)
Glucose-Capillary: 380 mg/dL — ABNORMAL HIGH (ref 65–99)
Glucose-Capillary: 447 mg/dL — ABNORMAL HIGH (ref 65–99)
Glucose-Capillary: >600 mg/dL (ref 65–99)

## 2017-11-07 LAB — CBC
HCT: 41.7 % (ref 40.0–52.0)
Hemoglobin: 14.5 g/dL (ref 13.0–18.0)
MCH: 31.9 pg (ref 26.0–34.0)
MCHC: 34.8 g/dL (ref 32.0–36.0)
MCV: 91.7 fL (ref 80.0–100.0)
PLATELETS: 214 10*3/uL (ref 150–440)
RBC: 4.55 MIL/uL (ref 4.40–5.90)
RDW: 12.8 % (ref 11.5–14.5)
WBC: 7.4 10*3/uL (ref 3.8–10.6)

## 2017-11-07 MED ORDER — FLUCONAZOLE 50 MG PO TABS
150.0000 mg | ORAL_TABLET | Freq: Once | ORAL | Status: AC
  Administered 2017-11-07: 150 mg via ORAL
  Filled 2017-11-07: qty 1

## 2017-11-07 MED ORDER — SODIUM CHLORIDE 0.9 % IV BOLUS
500.0000 mL | Freq: Once | INTRAVENOUS | Status: AC
  Administered 2017-11-07: 500 mL via INTRAVENOUS

## 2017-11-07 MED ORDER — INSULIN ASPART 100 UNIT/ML ~~LOC~~ SOLN
4.0000 [IU] | Freq: Once | SUBCUTANEOUS | Status: AC
  Administered 2017-11-07: 4 [IU] via INTRAVENOUS
  Filled 2017-11-07: qty 1

## 2017-11-07 MED ORDER — SODIUM CHLORIDE 0.9 % IV BOLUS
1000.0000 mL | Freq: Once | INTRAVENOUS | Status: AC
  Administered 2017-11-07: 1000 mL via INTRAVENOUS

## 2017-11-07 MED ORDER — INSULIN ASPART 100 UNIT/ML ~~LOC~~ SOLN
6.0000 [IU] | Freq: Once | SUBCUTANEOUS | Status: AC
  Administered 2017-11-07: 6 [IU] via SUBCUTANEOUS
  Filled 2017-11-07: qty 1

## 2017-11-07 NOTE — ED Provider Notes (Addendum)
[facility] Emergency Department Provider Note   ____________________________________________   First MD Initiated Contact with Patient 11/07/17 0123     (approximate)  I have reviewed the triage vital signs and the nursing notes.   HISTORY  Chief Complaint Hyperglycemia    HPI Chad Barrell Sr. is a 57 y.o. male Who comes into the hospital today with some elevated blood sugars.  The patient states that he thinks it has been high for about a week.  He has been feeling a little weak during this week.  The patient states that he left his medicine in his car and has been traveling a lot for the last couple of weeks.  The patient last regularly took his medicine about 2 weeks ago.  He states that he came home last night and took some of his medicine but knew his blood sugars were high.  The patient also states that his diet has been poor and he has been drinking a lot of soda.  He states that he has a yeast infection on his penis but he denies pain anywhere.  The patient is here to evaluate his high blood sugars.  The patient is also had some tingling in his hands and feet.   Past Medical History:  Diagnosis Date  . Diabetes mellitus without complication (HCC)   . Hypertension     There are no active problems to display for this patient.   Past Surgical History:  Procedure Laterality Date  . COLONOSCOPY    . FEMUR CLOSED REDUCTION      Prior to Admission medications   Medication Sig Start Date End Date Taking? Authorizing Provider  Aspirin-Salicylamide-Caffeine (BC HEADACHE PO) Take 1 packet by mouth 2 (two) times daily as needed (for pain).    [provider]  diazepam (VALIUM) 2 MG tablet Take 1 tablet (2 mg total) by mouth every 8 (eight) hours as needed for muscle spasms. 05/01/15   Tommi Rumps, PA-C  ibuprofen (ADVIL,MOTRIN) 200 MG tablet Take 400-500 mg by mouth every 6 (six) hours as needed for moderate pain.    [provider]  metFORMIN (GLUCOPHAGE) 1000 MG tablet Take 1 tablet (1,000 mg total) by mouth 2 (two) times daily with a meal. 10/10/15 10/09/16  Darci Current, MD  metFORMIN (GLUCOPHAGE) 500 MG tablet Take 1 tablet (500 mg total) by mouth 2 (two) times daily with a meal. 06/25/15 06/24/16  Sharman Cheek, MD  naproxen (NAPROSYN) 500 MG tablet Take 1 tablet (500 mg total) by mouth 2 (two) times daily with a meal. 05/01/15   Tommi Rumps, PA-C  ranitidine (ZANTAC) 150 MG capsule Take 1 capsule (150 mg total) by mouth 2 (two) times daily. 06/25/15   Sharman Cheek, MD  traMADol (ULTRAM) 50 MG tablet Take 1 tablet (50 mg total) by mouth every 6 (six) hours as needed. Patient not taking: Reported on 11/30/2014 05/19/14   Terri Piedra, PA-C    Allergies Patient has no known allergies.  No family history on file.  Social History Social History   Tobacco Use  . Smoking status: Current Every Day Smoker    Packs/day: 0.00    Types: Cigars  Substance Use Topics  . Alcohol use: No  . Drug use: No    Review of Systems  Constitutional: No fever/chills Eyes: No visual changes. ENT: No sore throat. Cardiovascular: Denies chest pain. Respiratory: Denies shortness of breath. Gastrointestinal: No abdominal pain.  No nausea, no vomiting.  No diarrhea.  No constipation. Genitourinary: Negative for dysuria. Musculoskeletal: Negative for back pain. Skin: Negative for rash. Neurological: Tingling in hands and feet   ____________________________________________   PHYSICAL EXAM:  VITAL SIGNS: ED Triage Vitals  Enc Vitals Group     BP 11/07/17 0022 (!) 164/97     Pulse Rate 11/07/17 0022 90     Resp 11/07/17 0022 18     Temp 11/07/17 0022 98.2 F (36.8 C)     Temp Source 11/07/17 0022 Oral     SpO2 11/07/17 0022 99 %     Weight 11/07/17 0021 170 lb (77.1 kg)     Height 11/07/17 0021  (1.676 m)     Head Circumference --      Peak Flow --      Pain Score --       Pain Loc --      Pain Edu? --      Excl. in GC? --     Constitutional: Alert and oriented. Well appearing and in mild distress. Eyes: Conjunctivae are normal. PERRL. EOMI. Head: Atraumatic. Nose: No congestion/rhinnorhea. Mouth/Throat: Mucous membranes are moist.  Oropharynx non-erythematous. Cardiovascular: Normal rate, regular rhythm. Grossly normal heart sounds.  Good peripheral circulation. Respiratory: Normal respiratory effort.  No retractions. Lungs CTAB. Gastrointestinal: Soft and nontender. No distention.  Positive bowel sounds Musculoskeletal: No lower extremity tenderness nor edema.   Neurologic:  Normal speech and language.  Skin:  Skin is warm, dry and intact.  Psychiatric: Mood and affect are normal.  ____________________________________________   LABS (all labs ordered are listed, but only abnormal results are displayed)  Labs Reviewed  BASIC METABOLIC PANEL - Abnormal; Notable for the following components:      Result Value   Sodium 129 (*)    Chloride 96 (*)    Glucose, Bld 719 (*)    All other components within normal limits  URINALYSIS, COMPLETE (UACMP) WITH MICROSCOPIC - Abnormal; Notable for the following components:   Color, Urine YELLOW (*)    APPearance CLEAR (*)    Glucose, UA >=500 (*)    Leukocytes, UA SMALL (*)    Bacteria, UA RARE (*)    All other components within normal limits  GLUCOSE, CAPILLARY - Abnormal; Notable for the following components:   Glucose-Capillary >600 (*)    All other components within normal limits  GLUCOSE, CAPILLARY - Abnormal; Notable for the following components:   Glucose-Capillary >600 (*)    All other components within normal limits  GLUCOSE, CAPILLARY - Abnormal; Notable for the following components:   Glucose-Capillary 447 (*)    All other components within normal limits  GLUCOSE, CAPILLARY - Abnormal; Notable for the following components:   Glucose-Capillary 380 (*)    All other components within normal  limits  GLUCOSE, CAPILLARY - Abnormal; Notable for the following components:   Glucose-Capillary 282 (*)    All other components within normal limits  CBC  CBG MONITORING, ED   ____________________________________________  EKG  none ____________________________________________  RADIOLOGY  ED MD interpretation:  none  Official radiology report(s): No results found.  ____________________________________________   PROCEDURES  Procedure(s) performed: None  .Critical Care Performed by: Rebecka Apley, MD Authorized by: Rebecka Apley, MD   Critical care provider statement:    Critical care time (minutes):  30   Critical care start time:  11/07/2017 1:23 AM   Critical care end time:  11/07/2017 2:00 AM   Critical care time was exclusive of:  Separately billable procedures and treating other patients   Critical care was necessary to treat or prevent imminent or life-threatening deterioration of the following conditions:  Endocrine crisis   Critical care was time spent personally by me on the following activities:  Development of treatment plan with patient or surrogate, evaluation of patient's response to treatment, examination of patient, obtaining history from patient or surrogate, ordering and performing treatments and interventions, ordering and review of laboratory studies, ordering and review of radiographic studies, pulse oximetry, re-evaluation of patient's condition and review of old charts   I assumed direction of critical care for this patient from another provider in my specialty: no      Critical Care performed: Yes, see critical care note(s)  ____________________________________________   INITIAL IMPRESSION / ASSESSMENT AND PLAN / ED COURSE  As part of my medical decision making, I reviewed the following data within the electronic MEDICAL RECORD NUMBER Notes from prior ED visits and [place] Controlled Substance Database   This is a 56 year old male who comes  into the hospital today with elevated blood sugars.  The patient has not taken his medication in over 2 weeks.  The patient's blood sugar was found to be 719 once the blood work returned.  His sodium was 129 and his chloride was 96.  The patient's CBC is unremarkable.  I gave the patient a liter of normal saline and his sugars came down to 447.  He received a second liter and some subcutaneous insulin and his sugars came down to 380.  I will give the patient a 500 mm bolus of normal saline and 4 units IV.  If we are able to get the patient's blood sugar below 300 he will be discharged to resume taking his medications.  The patient's blood sugar improved after the 500 mL bolus and 4 units of insulin to 282.  The patient will be discharged home.      ____________________________________________   FINAL CLINICAL IMPRESSION(S) / ED DIAGNOSES  Final diagnoses:  Hyperglycemia     ED Discharge Orders    None       Note:  This document was prepared using Dragon voice recognition software and may include unintentional dictation errors.   Rebecka Apley, MD 11/07/17 1610  Rebecka Apley, MD 11/16/17 [phone]

## 2017-11-07 NOTE — Discharge Instructions (Addendum)
Please resume taking your diabetes medications.  Please refrain from eating high carb and high sugar foods and drinks.  Please follow-up with your primary care physician.

## 2017-11-07 NOTE — ED Triage Notes (Signed)
Patient states he has been having trouble with his "sugar" that his machine has been reading in the 400's.  Patient also reports urine a lot.  Patient also reports that his hands are tingling.

## 2017-11-07 NOTE — ED Notes (Signed)
Blood sugar 719 reported to Dr Zenda Alpers; acknowledged; bed assignment given by charge nurse

## 2017-11-08 LAB — GLUCOSE, CAPILLARY: Glucose-Capillary: >600 mg/dL (ref 65–99)

## 2018-01-29 ENCOUNTER — Emergency Department
Admission: EM | Admit: 2018-01-29 | Discharge: 2018-01-29 | Disposition: A | Discharge status: Home / Self Care | Payer: PRIVATE HEALTH INSURANCE | Attending: Emergency Medicine | Admitting: Emergency Medicine

## 2018-01-29 ENCOUNTER — Emergency Department: Payer: Self-pay

## 2018-01-29 ENCOUNTER — Encounter: Payer: Self-pay | Admitting: Emergency Medicine

## 2018-01-29 ENCOUNTER — Other Ambulatory Visit: Payer: Self-pay

## 2018-01-29 DIAGNOSIS — Z7984 Long term (current) use of oral hypoglycemic drugs: Secondary | ICD-10-CM | POA: Insufficient documentation

## 2018-01-29 DIAGNOSIS — M79602 Pain in left arm: Secondary | ICD-10-CM

## 2018-01-29 DIAGNOSIS — F17290 Nicotine dependence, other tobacco product, uncomplicated: Secondary | ICD-10-CM | POA: Insufficient documentation

## 2018-01-29 DIAGNOSIS — R739 Hyperglycemia, unspecified: Secondary | ICD-10-CM

## 2018-01-29 DIAGNOSIS — E119 Type 2 diabetes mellitus without complications: Secondary | ICD-10-CM | POA: Insufficient documentation

## 2018-01-29 DIAGNOSIS — I10 Essential (primary) hypertension: Secondary | ICD-10-CM | POA: Insufficient documentation

## 2018-01-29 LAB — BASIC METABOLIC PANEL
Anion gap: 8 (ref 5–15)
BUN: 10 mg/dL (ref 6–20)
CHLORIDE: 98 mmol/L (ref 98–111)
CO2: 27 mmol/L (ref 22–32)
Calcium: 9.2 mg/dL (ref 8.9–10.3)
Creatinine, Ser: 0.9 mg/dL (ref 0.61–1.24)
GFR calc Af Amer: >60 mL/min (ref >60)
GFR calc non Af Amer: >60 mL/min (ref >60)
Glucose, Bld: 535 mg/dL (ref 70–99)
Potassium: 4 mmol/L (ref 3.5–5.1)
SODIUM: 133 mmol/L — AB (ref 135–145)

## 2018-01-29 LAB — CBC
HCT: 42.2 % (ref 40.0–52.0)
Hemoglobin: 15.5 g/dL (ref 13.0–18.0)
MCH: 32.8 pg (ref 26.0–34.0)
MCHC: 36.7 g/dL — ABNORMAL HIGH (ref 32.0–36.0)
MCV: 89.2 fL (ref 80.0–100.0)
Platelets: 236 10*3/uL (ref 150–440)
RBC: 4.73 MIL/uL (ref 4.40–5.90)
RDW: 12.7 % (ref 11.5–14.5)
WBC: 9.1 10*3/uL (ref 3.8–10.6)

## 2018-01-29 LAB — GLUCOSE, CAPILLARY
GLUCOSE-CAPILLARY: 227 mg/dL — AB (ref 70–99)
Glucose-Capillary: 591 mg/dL (ref 70–99)
Glucose-Capillary: 375 mg/dL — ABNORMAL HIGH (ref 70–99)

## 2018-01-29 LAB — TROPONIN I: Troponin I: <0.03 ng/mL (ref <0.03)

## 2018-01-29 MED ORDER — FLUCONAZOLE 50 MG PO TABS
150.0000 mg | ORAL_TABLET | Freq: Once | ORAL | Status: AC
  Administered 2018-01-29: 150 mg via ORAL
  Filled 2018-01-29: qty 1

## 2018-01-29 MED ORDER — SODIUM CHLORIDE 0.9 % IV BOLUS
1000.0000 mL | Freq: Once | INTRAVENOUS | Status: AC
  Administered 2018-01-29: 1000 mL via INTRAVENOUS

## 2018-01-29 MED ORDER — INSULIN ASPART 100 UNIT/ML ~~LOC~~ SOLN
2.0000 [IU] | Freq: Once | SUBCUTANEOUS | Status: AC
  Administered 2018-01-29: 2 [IU] via INTRAVENOUS
  Filled 2018-01-29: qty 1

## 2018-01-29 NOTE — ED Provider Notes (Addendum)
[facility] Emergency Department Provider Note   ____________________________________________   First MD Initiated Contact with Patient 01/29/18 1945     (approximate)  I have reviewed the triage vital signs and the nursing notes.   HISTORY  Chief Complaint Arm Pain and Hyperglycemia    HPI Chad Lomax Sr. is a 57 y.o. male who reports about 2 weeks of left arm pain radiating from his neck down into the upper arm.  It just a deep pain.  Is not electrical like or anything else nothing he does seems to make it better or worse.  Sometimes is worse with movement.  Patient also reports he is been drinking a lot of [employer] lately.  His blood sugars been high.  Is been having some yeast infections on his penis as well.  Takes metformin thousand milligrams twice a day for his sugars   Past Medical History:  Diagnosis Date  . Diabetes mellitus without complication (HCC)   . Hypertension     There are no active problems to display for this patient.   Past Surgical History:  Procedure Laterality Date  . COLONOSCOPY    . FEMUR CLOSED REDUCTION      Prior to Admission medications   Medication Sig Start Date End Date Taking? Authorizing Provider  Aspirin-Salicylamide-Caffeine (BC HEADACHE PO) Take 1 packet by mouth 2 (two) times daily as needed (for pain).    [provider]  diazepam (VALIUM) 2 MG tablet Take 1 tablet (2 mg total) by mouth every 8 (eight) hours as needed for muscle spasms. 05/01/15   Tommi Rumps, PA-C  ibuprofen (ADVIL,MOTRIN) 200 MG tablet Take 400-500 mg by mouth every 6 (six) hours as needed for moderate pain.    [provider]  metFORMIN (GLUCOPHAGE) 1000 MG tablet Take 1 tablet (1,000 mg total) by mouth 2 (two) times daily with a meal. 10/10/15 10/09/16  Darci Current, MD  metFORMIN (GLUCOPHAGE) 500 MG tablet Take 1 tablet (500 mg total) by mouth 2 (two) times daily with a meal. 06/25/15 06/24/16   Sharman Cheek, MD  naproxen (NAPROSYN) 500 MG tablet Take 1 tablet (500 mg total) by mouth 2 (two) times daily with a meal. 05/01/15   Tommi Rumps, PA-C  ranitidine (ZANTAC) 150 MG capsule Take 1 capsule (150 mg total) by mouth 2 (two) times daily. 06/25/15   Sharman Cheek, MD  traMADol (ULTRAM) 50 MG tablet Take 1 tablet (50 mg total) by mouth every 6 (six) hours as needed. Patient not taking: Reported on 11/30/2014 05/19/14   Terri Piedra, PA-C    Allergies Patient has no known allergies.  No family history on file.  Social History Social History   Tobacco Use  . Smoking status: Current Every Day Smoker    Packs/day: 0.00    Types: Cigars  . Smokeless tobacco: Never Used  Substance Use Topics  . Alcohol use: No  . Drug use: No    Review of Systems  Constitutional: No fever/chills Eyes: No visual changes. ENT: No sore throat. Cardiovascular: Denies chest pain. Respiratory: Denies shortness of breath. Gastrointestinal: No abdominal pain.  No nausea, no vomiting.  No diarrhea.  No constipation. Genitourinary: Negative for dysuria. Musculoskeletal: Negative for back pain. Skin: Negative for rash. Neurological: Negative for headaches, focal weakness   ____________________________________________   PHYSICAL EXAM:  VITAL SIGNS: ED Triage Vitals  Enc Vitals Group     BP 01/29/18 1917 (!) 141/85     Pulse Rate  01/29/18 1917 99     Resp 01/29/18 1917 18     Temp 01/29/18 1917 97.8 F (36.6 C)     Temp Source 01/29/18 1917 Oral     SpO2 01/29/18 1917 100 %     Weight 01/29/18 1915 175 lb (79.4 kg)     Height 01/29/18 1915 5\' 6"  (1.676 m)     Head Circumference --      Peak Flow --      Pain Score 01/29/18 1914 10     Pain Loc --      Pain Edu? --      Excl. in GC? --     Constitutional: Alert and oriented. Well appearing and in no acute distress. Eyes: Conjunctivae are normal.  Head: Atraumatic. Nose: No congestion/rhinnorhea. Mouth/Throat:  Mucous membranes are moist.  Oropharynx non-erythematous. Neck: No stridor.  No cervical spine tenderness to palpation. Cardiovascular: Normal rate, regular rhythm. Grossly normal heart sounds.  Good peripheral circulation. Respiratory: Normal respiratory effort.  No retractions. Lungs CTAB. Gastrointestinal: Soft and nontender. No distention. No abdominal bruits. No CVA tenderness. Musculoskeletal: No lower extremity tenderness nor edema.  No joint effusions. Neurologic:  Normal speech and language. No gross focal neurologic deficits are appreciated. No gait instability. Skin:  Skin is warm, dry and intact. No rash noted. Psychiatric: Mood and affect are normal. Speech and behavior are normal.  ____________________________________________   LABS (all labs ordered are listed, but only abnormal results are displayed)  Labs Reviewed  BASIC METABOLIC PANEL - Abnormal; Notable for the following components:      Result Value   Sodium 133 (*)    Glucose, Bld 535 (*)    All other components within normal limits  CBC - Abnormal; Notable for the following components:   MCHC 36.7 (*)    All other components within normal limits  GLUCOSE, CAPILLARY - Abnormal; Notable for the following components:   Glucose-Capillary 591 (*)    All other components within normal limits  TROPONIN I  CBG MONITORING, ED  CBG MONITORING, ED   ____________________________________________  EKG  KG read interpreted by me shows normal sinus rhythm rate of 96 normal axis no acute ST-T wave changes there is decreased R wave progression.  There is some diffuse ST elevation which is probably early re-pole. ____________________________________________  RADIOLOGY  ED MD interpretation: Chest x-ray read by radiology reviewed by me shows no acute disease  Official radiology report(s): Dg Chest 2 View  Result Date: 01/29/2018 CLINICAL DATA:  Acute LEFT chest pain for 2 weeks. EXAM: CHEST - 2 VIEW COMPARISON:   06/25/2015 and prior chest radiograph FINDINGS: The cardiomediastinal silhouette is unremarkable. There is no evidence of focal airspace disease, pulmonary edema, suspicious pulmonary nodule/mass, pleural effusion, or pneumothorax. No acute bony abnormalities are identified. IMPRESSION: No active cardiopulmonary disease. Electronically Signed   By: Harmon PierJeffrey  Hu M.D.   On: 01/29/2018 19:47   Dg Cervical Spine Complete  Result Date: 01/29/2018 CLINICAL DATA:  Neck pain EXAM: CERVICAL SPINE - COMPLETE 4+ VIEW COMPARISON:  05/01/2015 FINDINGS: Straightening of the cervical spine with suboptimal visualization of inferior C7 and T1. Vertebral body heights are normal. Mild degenerative change at C5-C6. Normal prevertebral soft tissue thickness. Dens and lateral masses are within normal limits. IMPRESSION: Suboptimal visualization of inferior C7 and cervicothoracic junction. Mild degenerative changes at C5-C6. Electronically Signed   By: Jasmine PangKim  Fujinaga M.D.   On: 01/29/2018 20:10   Dg Shoulder Left  Result Date: 01/29/2018 CLINICAL DATA:  Left shoulder pain EXAM: LEFT SHOULDER - 2+ VIEW COMPARISON:  01/29/2018, 06/25/2015 FINDINGS: No fracture or malalignment.  AC joint degenerative change. IMPRESSION: No acute osseous abnormality.  AC joint degenerative change Electronically Signed   By: Jasmine Pang M.D.   On: 01/29/2018 20:08    ____________________________________________   PROCEDURES  Procedure(s) performed:   Procedures  Critical Care performed:   ____________________________________________   INITIAL IMPRESSION / ASSESSMENT AND PLAN / ED COURSE  Patient getting fluids still has not had a half a liter yet we will repeat the CBG when he gets to half a liter and decide if we need to give him insulin at that point.   ----------------------------------------- 9:43 PM on 01/29/2018 -----------------------------------------  CBG has come down to 375.  We will finish the fluid give him another  liter and 2 of IV insulin check his fingerstick and half an hour.  Anticipate patient will be to go home.      ____________________________________________   FINAL CLINICAL IMPRESSION(S) / ED DIAGNOSES  Final diagnoses:  Hyperglycemia  Left arm pain     ED Discharge Orders    None       Note:  This document was prepared using Dragon voice recognition software and may include unintentional dictation errors.    Arnaldo Natal, MD 01/29/18 2129    Arnaldo Natal, MD 01/29/18 [phone]

## 2018-01-29 NOTE — ED Notes (Signed)
Peripheral IV discontinued. Catheter intact. No signs of infiltration or redness. Gauze applied to IV site.    Discharge instructions reviewed with patient. Questions fielded by this RN. Patient verbalizes understanding of instructions. Patient discharged home in stable condition per goodman. No acute distress noted at time of discharge.    
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
## 2018-01-29 NOTE — ED Triage Notes (Signed)
Pt arrives POV to triage with c/o left arm pain x 2 weeks. Pt denies trauma or injury to arm. Pt is ambulatory to triage with steady gait and is in NAD.

## 2018-01-29 NOTE — ED Notes (Signed)
Pt glu level checked, it is currently 375mg /dL

## 2018-01-29 NOTE — Discharge Instructions (Signed)
You can drink water or diet [facility].  Be careful to caffeine in the [facility] can make you dehydrated.  I would cut back on the amount of soda you drink a lot.  Check your fingersticks twice a day for now and keep a record of it.  Follow-up with your doctor this coming week.  Return here if your sugar goes above 300 again.

## 2018-01-29 NOTE — ED Notes (Signed)
CRITICAL LAB: Glucose is 535, [facility], Dr. Darnelle CatalanMalinda notified, orders received

## 2018-01-29 NOTE — ED Provider Notes (Signed)
Patient sugars have continued to come down.  At this point I think is reasonable to be discharged.  Will discharge with paperwork prepared by Dr. Juliette AlcideMelinda.  Did offer to do a genital exam given that the patient was complaining of redness to his penis.  Patient however declined at this time.   Chad Good, [name], MD 01/29/18 2256

## 2018-08-05 ENCOUNTER — Emergency Department: Payer: Self-pay

## 2018-08-05 ENCOUNTER — Emergency Department
Admission: EM | Admit: 2018-08-05 | Discharge: 2018-08-05 | Disposition: A | Discharge status: Home / Self Care | Payer: Self-pay | Attending: Emergency Medicine | Admitting: Emergency Medicine

## 2018-08-05 ENCOUNTER — Encounter: Payer: Self-pay | Admitting: Emergency Medicine

## 2018-08-05 DIAGNOSIS — B356 Tinea cruris: Secondary | ICD-10-CM | POA: Insufficient documentation

## 2018-08-05 DIAGNOSIS — Z79899 Other long term (current) drug therapy: Secondary | ICD-10-CM | POA: Insufficient documentation

## 2018-08-05 DIAGNOSIS — F17210 Nicotine dependence, cigarettes, uncomplicated: Secondary | ICD-10-CM | POA: Insufficient documentation

## 2018-08-05 DIAGNOSIS — I10 Essential (primary) hypertension: Secondary | ICD-10-CM | POA: Insufficient documentation

## 2018-08-05 DIAGNOSIS — R1030 Lower abdominal pain, unspecified: Secondary | ICD-10-CM | POA: Insufficient documentation

## 2018-08-05 DIAGNOSIS — E119 Type 2 diabetes mellitus without complications: Secondary | ICD-10-CM | POA: Insufficient documentation

## 2018-08-05 LAB — CBC WITH DIFFERENTIAL/PLATELET
Abs Immature Granulocytes: 0.06 10*3/uL (ref 0.00–0.07)
BASOS ABS: 0.1 10*3/uL (ref 0.0–0.1)
Basophils Relative: 1 %
Eosinophils Absolute: 0.3 10*3/uL (ref 0.0–0.5)
Eosinophils Relative: 3 %
HEMATOCRIT: 40.3 % (ref 39.0–52.0)
HEMOGLOBIN: 14.8 g/dL (ref 13.0–17.0)
IMMATURE GRANULOCYTES: 1 %
LYMPHS ABS: 3.2 10*3/uL (ref 0.7–4.0)
LYMPHS PCT: 32 %
MCH: 32.4 pg (ref 26.0–34.0)
MCHC: 36.7 g/dL — AB (ref 30.0–36.0)
MCV: 88.2 fL (ref 80.0–100.0)
Monocytes Absolute: 0.7 10*3/uL (ref 0.1–1.0)
Monocytes Relative: 7 %
NEUTROS ABS: 5.7 10*3/uL (ref 1.7–7.7)
NEUTROS PCT: 56 %
NRBC: 0 % (ref 0.0–0.2)
Platelets: 273 10*3/uL (ref 150–400)
RBC: 4.57 MIL/uL (ref 4.22–5.81)
RDW: 12.2 % (ref 11.5–15.5)
WBC: 9.9 10*3/uL (ref 4.0–10.5)

## 2018-08-05 LAB — URINALYSIS, COMPLETE (UACMP) WITH MICROSCOPIC
BACTERIA UA: NONE SEEN
BILIRUBIN URINE: NEGATIVE
HGB URINE DIPSTICK: NEGATIVE
Ketones, ur: NEGATIVE mg/dL
NITRITE: NEGATIVE
PROTEIN: NEGATIVE mg/dL
Specific Gravity, Urine: 1.02 (ref 1.005–1.030)
pH: 7 (ref 5.0–8.0)

## 2018-08-05 LAB — COMPREHENSIVE METABOLIC PANEL
ALT: 33 U/L (ref 0–44)
ANION GAP: 6 (ref 5–15)
AST: 27 U/L (ref 15–41)
Albumin: 4.2 g/dL (ref 3.5–5.0)
Alkaline Phosphatase: 159 U/L — ABNORMAL HIGH (ref 38–126)
BUN: 12 mg/dL (ref 6–20)
CHLORIDE: 101 mmol/L (ref 98–111)
CO2: 29 mmol/L (ref 22–32)
Calcium: 9.1 mg/dL (ref 8.9–10.3)
Creatinine, Ser: 0.95 mg/dL (ref 0.61–1.24)
Glucose, Bld: 301 mg/dL — ABNORMAL HIGH (ref 70–99)
POTASSIUM: 3.9 mmol/L (ref 3.5–5.1)
SODIUM: 136 mmol/L (ref 135–145)
Total Bilirubin: 1 mg/dL (ref 0.3–1.2)
Total Protein: 7.3 g/dL (ref 6.5–8.1)

## 2018-08-05 MED ORDER — FLUCONAZOLE 50 MG PO TABS
150.0000 mg | ORAL_TABLET | Freq: Once | ORAL | Status: AC
  Administered 2018-08-05: 150 mg via ORAL
  Filled 2018-08-05: qty 1

## 2018-08-05 MED ORDER — CYCLOBENZAPRINE HCL 5 MG PO TABS: 5.0000 mg | ORAL_TABLET | Freq: Three times a day (TID) | ORAL | 0 refills | Status: DC | PRN

## 2018-08-05 MED ORDER — AMLODIPINE BESYLATE 5 MG PO TABS
5.0000 mg | ORAL_TABLET | Freq: Once | ORAL | Status: AC
  Administered 2018-08-05: 5 mg via ORAL
  Filled 2018-08-05: qty 1

## 2018-08-05 MED ORDER — BLOOD GLUCOSE MONITOR KIT: PACK | 0 refills | Status: DC

## 2018-08-05 MED ORDER — IOPAMIDOL (ISOVUE-300) INJECTION 61%
30.0000 mL | Freq: Once | INTRAVENOUS | Status: AC | PRN
  Administered 2018-08-05: 30 mL via ORAL
  Filled 2018-08-05: qty 30

## 2018-08-05 MED ORDER — AMLODIPINE BESYLATE 5 MG PO TABS: 5.0000 mg | ORAL_TABLET | Freq: Every day | ORAL | 1 refills | Status: DC

## 2018-08-05 MED ORDER — IOPAMIDOL (ISOVUE-300) INJECTION 61%
100.0000 mL | Freq: Once | INTRAVENOUS | Status: AC | PRN
  Administered 2018-08-05: 100 mL via INTRAVENOUS
  Filled 2018-08-05: qty 100

## 2018-08-05 MED ORDER — FLUCONAZOLE 150 MG PO TABS: 150.0000 mg | ORAL_TABLET | Freq: Once | ORAL | 2 refills | Status: AC

## 2018-08-05 NOTE — Discharge Instructions (Addendum)
Your exam, labs, and CT scan are normal. You do have uncontrolled blood pressure and blood sugars. You should start the blood pressure medicine. Be sure to follow-up with the [place] [facility] for prescription help. You must follow-up with [facility] for regular care and ongoing prescription. Take OTC Tylenol along with the muscle relaxant for back pain. Return as needed.

## 2018-08-05 NOTE — ED Triage Notes (Signed)
Patient presents to the ED with bilateral lower back and hip pain radiating down legs x 1 month.  Patient states today pain is even worse than normal.  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  Patient ambulatory to triage.  No obvious distress at this time.

## 2018-08-05 NOTE — ED Notes (Signed)
Pain to lower back x 1 month, pain is shooting down bila legs x 2 days. Reports pain non traumatic, denies heavy lifting  Or fall.

## 2018-08-05 NOTE — ED Provider Notes (Signed)
[facility] Emergency Department Provider Note ____________________________________________  Time seen: 2005  I have reviewed the triage vital signs and the nursing notes.  HISTORY  Chief Complaint  Back Pain and Hip Pain  HPI Chad Madeira Sr. is a 58 y.o. male presents to the ED for evaluation of bilateral lower back pain with radiation into the buttocks that is explained for the last month.  Patient also describes some pain radiating around the lower back to his lower abdomen.  He describes a pain or discomfort today is worse than normal.  He reports that the pain is aggravated by prolonged sitting he denies any bladder or bowel incontinence, foot drop, saddle anesthesias, or leg weakness.  When asked to localize the pain and what he describes as his hips, he places his hands across his lower abdomen.  He gives a history of some intermittent constipation, but notes that generally his bowels are soft and loose secondary to his daily metformin.  He denies any nausea, vomiting, diarrhea, hematochezia, melena, urgency, hematuria or frequency.  He denies any recent injury, accident, trauma, or fall.  He admits that the pain in the lower back has been significant enough to warrant him to lay down when he gets home in the evenings.  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  Patient presents now for further evaluation of low back and lower abdominal pain.  Patient admits to poor compliance with his blood pressure regimen and has not taken any medication in several weeks. He is without chest pain, SOB, or syncope.   Past Medical History:  Diagnosis Date  . Diabetes mellitus without complication ([place])   . Hypertension     There are no active problems to display for this patient.   Past Surgical History:  Procedure Laterality Date  . COLONOSCOPY    . FEMUR CLOSED REDUCTION      Prior to Admission medications   Medication Sig Start  Date End Date Taking? Authorizing Provider  amLODipine (NORVASC) 5 MG tablet Take 1 tablet (5 mg total) by mouth daily. 08/05/18 10/04/18  Ecko Beasley, Dannielle Karvonen, PA-C  Aspirin-Salicylamide-Caffeine (BC HEADACHE PO) Take 1 packet by mouth 2 (two) times daily as needed (for pain).    [provider]  blood glucose meter kit and supplies KIT Dispense based on patient and insurance preference. Use up to four times daily as directed. (FOR ICD-9 250.00, 250.01). 08/05/18   Danyah Guastella, Dannielle Karvonen, PA-C  cyclobenzaprine (FLEXERIL) 5 MG tablet Take 1 tablet (5 mg total) by mouth 3 (three) times daily as needed for muscle spasms. 08/05/18   Manjit Bufano, Dannielle Karvonen, PA-C  diazepam (VALIUM) 2 MG tablet Take 1 tablet (2 mg total) by mouth every 8 (eight) hours as needed for muscle spasms. 05/01/15   Johnn Hai, PA-C  ibuprofen (ADVIL,MOTRIN) 200 MG tablet Take 400-500 mg by mouth every 6 (six) hours as needed for moderate pain.    [provider]  metFORMIN (GLUCOPHAGE) 1000 MG tablet Take 1 tablet (1,000 mg total) by mouth 2 (two) times daily with a meal. 10/10/15 10/09/16  Gregor Hams, MD  metFORMIN (GLUCOPHAGE) 500 MG tablet Take 1 tablet (500 mg total) by mouth 2 (two) times daily with a meal. 06/25/15 06/24/16  Carrie Mew, MD  naproxen (NAPROSYN) 500 MG tablet Take 1 tablet (500 mg total) by mouth 2 (two) times daily with a meal. 05/01/15   Letitia Neri  L, PA-C  ranitidine (ZANTAC) 150 MG capsule Take 1 capsule (150 mg total) by mouth 2 (two) times daily. 06/25/15   Carrie Mew, MD  traMADol (ULTRAM) 50 MG tablet Take 1 tablet (50 mg total) by mouth every 6 (six) hours as needed. Patient not taking: Reported on 11/30/2014 05/19/14   Rolene Course, PA-C    Allergies Patient has no known allergies.  No family history on file.  Social History Social History   Tobacco Use  . Smoking status: Current Every Day Smoker    Packs/day: 0.00    Types: Cigars  .  Smokeless tobacco: Never Used  Substance Use Topics  . Alcohol use: No  . Drug use: No    Review of Systems  Constitutional: Negative for fever. Eyes: Negative for visual changes. ENT: Negative for sore throat. Cardiovascular: Negative for chest pain. Respiratory: Negative for shortness of breath. Gastrointestinal: Positive for abdominal pain. Denies nausea, vomiting and diarrhea. Genitourinary: Negative for dysuria. Musculoskeletal: Positive for back pain. Skin: Positive for jock itch. Neurological: Negative for headaches, focal weakness or numbness. ____________________________________________  PHYSICAL EXAM:  VITAL SIGNS: ED Triage Vitals  Enc Vitals Group     BP 08/05/18 1846 (!) 190/105     Pulse Rate 08/05/18 1846 92     Resp 08/05/18 1846 16     Temp 08/05/18 1846 98.1 F (36.7 C)     Temp Source 08/05/18 1846 Oral     SpO2 08/05/18 1846 97 %     Weight 08/05/18 1845 175 lb (79.4 kg)     Height 08/05/18 1845 '5\' 6"'$  (1.676 m)     Head Circumference --      Peak Flow --      Pain Score 08/05/18 1844 10     Pain Loc --      Pain Edu? --      Excl. in [place]? --     Constitutional: Alert and oriented. Well appearing and in no distress.  Patient is pleasant, talkative, and engaging. Head: Normocephalic and atraumatic. Eyes: Conjunctivae are normal. Normal extraocular movements Cardiovascular: Normal rate, regular rhythm. Normal distal pulses. Respiratory: Normal respiratory effort. No wheezes/rales/rhonchi. Gastrointestinal: Soft and nontender. No distention, rebound, guarding, or rigidity.  Normal bowel sounds noted x4.  No significant tenderness to palpation along the lower abdominal regions.  No CVA tenderness is elicited. GU: deferred by patient.  Musculoskeletal: Normal spinal alignment without midline tenderness, spasm, deformity, or step-off.  Nontender with normal range of motion in all extremities.  Neurologic: Cranial nerves II through XII grossly intact.   Normal LE DTRs bilaterally.  Normal gait without ataxia. Normal speech and language. No gross focal neurologic deficits are appreciated. Skin:  Skin is warm, dry and intact. No rash noted. ____________________________________________   LABS (pertinent positives/negatives) Labs Reviewed  COMPREHENSIVE METABOLIC PANEL - Abnormal; Notable for the following components:      Result Value   Glucose, Bld 301 (*)    Alkaline Phosphatase 159 (*)    All other components within normal limits  CBC WITH DIFFERENTIAL/PLATELET - Abnormal; Notable for the following components:   MCHC 36.7 (*)    All other components within normal limits  URINALYSIS, COMPLETE (UACMP) WITH MICROSCOPIC - Abnormal; Notable for the following components:   Color, Urine YELLOW (*)    APPearance CLEAR (*)    Glucose, UA >=500 (*)    Leukocytes, UA TRACE (*)    All other components within normal limits  ____________________________________________   RADIOLOGY  CT  ABD/Pelvis w/ CM  IMPRESSION: 1. No acute process identified. 2.  Aortic Atherosclerosis (ICD10-I70.0). 3. Otherwise unremarkable CT of abdomen and pelvis. ____________________________________________  PROCEDURES  Procedures Norvasc 5 mg PO Diflucan 150 mg PO ____________________________________________  INITIAL IMPRESSION / ASSESSMENT AND PLAN / ED COURSE  Differential diagnosis includes, but is not limited to, acute appendicitis, renal colic, testicular torsion, urinary tract infection/pyelonephritis, prostatitis,  epididymitis, diverticulitis, small bowel obstruction or ileus, colitis, abdominal aortic aneurysm, gastroenteritis, hernia, etc.  Patient with ED evaluation of multiple complaints. He is primarily here for lower back pain and lower abdominal pain. He was worked-up for the abdominal pain with a CT that was negative. No definitive cause for the lower abdomina pain is found.  I believe his back pain is likely musculoskeletal and mechanical in  nature. We deferred plain films of the lumbar spine to another time. He is also given a prescription for Diflucan and a refill for his recurrent tinea cruris. He will be given a courtesy refill of his blood pressure medicine. He has been admittedly non-compliant with his meds. His labs are normal and he will be started on amlodipine instead of the previous HCTZ. He was advised to avoid NSAIDs for pain relief. He will follow-up with [facility] for routine care. Return precautions have been reviewed.  ____________________________________________  FINAL CLINICAL IMPRESSION(S) / ED DIAGNOSES  Final diagnoses:  Lower abdominal pain  Uncontrolled hypertension  Tinea cruris      Loucile Posner, Dannielle Karvonen, PA-C 08/09/18 1559    Harvest Schiffer, MD 08/10/18 2001

## 2018-08-10 ENCOUNTER — Ambulatory Visit: Payer: PRIVATE HEALTH INSURANCE | Admitting: Pharmacy Technician

## 2018-08-10 DIAGNOSIS — Z79899 Other long term (current) drug therapy: Secondary | ICD-10-CM

## 2018-08-10 NOTE — Progress Notes (Addendum)
Met with patient completed financial assistance application for [place] due to recent hospital visit.  Patient agreed to be responsible for gathering financial information and forwarding to appropriate department in [facility].    Completed [facility] application and contract.  Patient agreed to all terms of the [facility] contract.    Patient to provide notarized letter of support from brother-Larry Kruschke.    Patient acknowledged that he understood  additional medication refills will not be provided without providing the letter of support to [facility].  Provided patient with Civil engineer, contracting based on his particular needs.    [facility]

## 2018-08-18 ENCOUNTER — Telehealth: Payer: Self-pay | Admitting: Pharmacy Technician

## 2018-08-18 NOTE — Telephone Encounter (Signed)
Received updated proof of income.  Patient eligible to receive medication assistance at [facility] as long as eligibility requirements continue to be met.  Betty J. Kluttz Care Manager [facility] 

## 2018-10-03 ENCOUNTER — Other Ambulatory Visit (INDEPENDENT_AMBULATORY_CARE_PROVIDER_SITE_OTHER): Payer: Self-pay | Admitting: Vascular Surgery

## 2018-10-03 DIAGNOSIS — I739 Peripheral vascular disease, unspecified: Secondary | ICD-10-CM

## 2018-10-04 ENCOUNTER — Encounter (INDEPENDENT_AMBULATORY_CARE_PROVIDER_SITE_OTHER): Payer: Self-pay

## 2018-10-04 ENCOUNTER — Ambulatory Visit (INDEPENDENT_AMBULATORY_CARE_PROVIDER_SITE_OTHER): Payer: Self-pay | Admitting: Vascular Surgery

## 2019-12-29 ENCOUNTER — Other Ambulatory Visit (INDEPENDENT_AMBULATORY_CARE_PROVIDER_SITE_OTHER): Payer: Self-pay | Admitting: Vascular Surgery

## 2019-12-29 DIAGNOSIS — I739 Peripheral vascular disease, unspecified: Secondary | ICD-10-CM

## 2020-07-03 ENCOUNTER — Telehealth: Payer: Self-pay | Admitting: Pharmacy Technician

## 2020-07-03 NOTE — Telephone Encounter (Signed)
Patient failed to provide 2021 proof of income.  No additional medication assistance will be provided by [facility] without the required proof of income documentation.  Patient notified by letter.  Sherilyn Dacosta Care Manager [facility]   [address] [place], [place]  [address]   July 03, 2020  Noel Journey, Sr. [address] [place], [place]  [address]  Dear Roe Coombs:  This is to inform you that you are no longer eligible to receive medication assistance at [facility].  The reason(s) are:    _____Your total gross monthly household income exceeds 250% of the Federal Poverty Level.   _____Tangible assets (savings, checking, stocks/bonds, pension, retirement, etc.) exceeds our limit  _____You are eligible to receive benefits from [facility] or HIV Medication              Assistance Program _____You are eligible to receive benefits from a Medicare Part D plan _____You have prescription insurance  _____You are not an [facility] resident __X__Failure to provide all requested proof of income information for 2021.    Medication assistance will resume once all requested financial information has been returned to our clinic.  If you have questions, please contact our clinic at [phone].    Thank you,  [facility]

## 2020-07-16 ENCOUNTER — Other Ambulatory Visit: Payer: Self-pay

## 2020-07-16 ENCOUNTER — Emergency Department: Payer: PRIVATE HEALTH INSURANCE

## 2020-07-16 ENCOUNTER — Observation Stay
Admission: EM | Admit: 2020-07-16 | Discharge: 2020-07-17 | Disposition: A | Discharge status: Home / Self Care | Payer: PRIVATE HEALTH INSURANCE | Attending: Family Medicine | Admitting: Family Medicine

## 2020-07-16 ENCOUNTER — Encounter: Payer: Self-pay | Admitting: Emergency Medicine

## 2020-07-16 DIAGNOSIS — E119 Type 2 diabetes mellitus without complications: Secondary | ICD-10-CM

## 2020-07-16 DIAGNOSIS — I10 Essential (primary) hypertension: Secondary | ICD-10-CM | POA: Diagnosis present

## 2020-07-16 DIAGNOSIS — F17290 Nicotine dependence, other tobacco product, uncomplicated: Secondary | ICD-10-CM | POA: Insufficient documentation

## 2020-07-16 DIAGNOSIS — Z20822 Contact with and (suspected) exposure to covid-19: Secondary | ICD-10-CM | POA: Diagnosis not present

## 2020-07-16 DIAGNOSIS — Z7984 Long term (current) use of oral hypoglycemic drugs: Secondary | ICD-10-CM | POA: Diagnosis not present

## 2020-07-16 DIAGNOSIS — R002 Palpitations: Secondary | ICD-10-CM | POA: Diagnosis not present

## 2020-07-16 DIAGNOSIS — R778 Other specified abnormalities of plasma proteins: Secondary | ICD-10-CM | POA: Diagnosis present

## 2020-07-16 DIAGNOSIS — G609 Hereditary and idiopathic neuropathy, unspecified: Secondary | ICD-10-CM | POA: Insufficient documentation

## 2020-07-16 DIAGNOSIS — Z79899 Other long term (current) drug therapy: Secondary | ICD-10-CM | POA: Insufficient documentation

## 2020-07-16 DIAGNOSIS — R55 Syncope and collapse: Secondary | ICD-10-CM | POA: Diagnosis not present

## 2020-07-16 DIAGNOSIS — R748 Abnormal levels of other serum enzymes: Secondary | ICD-10-CM | POA: Diagnosis not present

## 2020-07-16 DIAGNOSIS — R7989 Other specified abnormal findings of blood chemistry: Secondary | ICD-10-CM | POA: Diagnosis present

## 2020-07-16 DIAGNOSIS — G629 Polyneuropathy, unspecified: Secondary | ICD-10-CM

## 2020-07-16 LAB — CBC WITH DIFFERENTIAL/PLATELET
Abs Immature Granulocytes: 0.03 10*3/uL (ref 0.00–0.07)
Basophils Absolute: 0 10*3/uL (ref 0.0–0.1)
Basophils Relative: 0 %
Eosinophils Absolute: 0.2 10*3/uL (ref 0.0–0.5)
Eosinophils Relative: 2 %
HCT: 39.4 % (ref 39.0–52.0)
Hemoglobin: 14.4 g/dL (ref 13.0–17.0)
Immature Granulocytes: 0 %
Lymphocytes Relative: 34 %
Lymphs Abs: 2.6 10*3/uL (ref 0.7–4.0)
MCH: 31.6 pg (ref 26.0–34.0)
MCHC: 36.5 g/dL — ABNORMAL HIGH (ref 30.0–36.0)
MCV: 86.4 fL (ref 80.0–100.0)
Monocytes Absolute: 0.5 10*3/uL (ref 0.1–1.0)
Monocytes Relative: 7 %
Neutro Abs: 4.2 10*3/uL (ref 1.7–7.7)
Neutrophils Relative %: 57 %
Platelets: 231 10*3/uL (ref 150–400)
RBC: 4.56 MIL/uL (ref 4.22–5.81)
RDW: 11.9 % (ref 11.5–15.5)
WBC: 7.6 10*3/uL (ref 4.0–10.5)
nRBC: 0 % (ref 0.0–0.2)

## 2020-07-16 LAB — COMPREHENSIVE METABOLIC PANEL
ALT: 46 U/L — ABNORMAL HIGH (ref 0–44)
AST: 32 U/L (ref 15–41)
Albumin: 3.2 g/dL — ABNORMAL LOW (ref 3.5–5.0)
Alkaline Phosphatase: 251 U/L — ABNORMAL HIGH (ref 38–126)
Anion gap: 6 (ref 5–15)
BUN: 14 mg/dL (ref 6–20)
CO2: 29 mmol/L (ref 22–32)
Calcium: 8.7 mg/dL — ABNORMAL LOW (ref 8.9–10.3)
Chloride: 99 mmol/L (ref 98–111)
Creatinine, Ser: 1.14 mg/dL (ref 0.61–1.24)
GFR, Estimated: >60 mL/min (ref >60)
Glucose, Bld: 422 mg/dL — ABNORMAL HIGH (ref 70–99)
Potassium: 3.8 mmol/L (ref 3.5–5.1)
Sodium: 134 mmol/L — ABNORMAL LOW (ref 135–145)
Total Bilirubin: 0.8 mg/dL (ref 0.3–1.2)
Total Protein: 6.6 g/dL (ref 6.5–8.1)

## 2020-07-16 LAB — RESP PANEL BY RT-PCR (FLU A&B, COVID) ARPGX2
Influenza A by PCR: NEGATIVE
Influenza B by PCR: NEGATIVE
SARS Coronavirus 2 by RT PCR: NEGATIVE

## 2020-07-16 LAB — TROPONIN I (HIGH SENSITIVITY)
Troponin I (High Sensitivity): 25 ng/L — ABNORMAL HIGH (ref <18)
Troponin I (High Sensitivity): 27 ng/L — ABNORMAL HIGH (ref <18)
Troponin I (High Sensitivity): 28 ng/L — ABNORMAL HIGH (ref <18)

## 2020-07-16 MED ORDER — DIAZEPAM 2 MG PO TABS: 2.0000 mg | ORAL_TABLET | Freq: Three times a day (TID) | ORAL | Status: DC | PRN

## 2020-07-16 MED ORDER — ONDANSETRON HCL 4 MG/2ML IJ SOLN: 4.0000 mg | Freq: Four times a day (QID) | INTRAMUSCULAR | Status: DC | PRN

## 2020-07-16 MED ORDER — ACETAMINOPHEN 325 MG PO TABS: 650.0000 mg | ORAL_TABLET | ORAL | Status: DC | PRN

## 2020-07-16 MED ORDER — NAPROXEN 500 MG PO TABS
500.0000 mg | ORAL_TABLET | Freq: Two times a day (BID) | ORAL | Status: DC
  Administered 2020-07-17: 500 mg via ORAL
  Filled 2020-07-16: qty 1

## 2020-07-16 MED ORDER — ENOXAPARIN SODIUM 40 MG/0.4ML ~~LOC~~ SOLN
40.0000 mg | SUBCUTANEOUS | Status: DC
  Administered 2020-07-16: 40 mg via SUBCUTANEOUS
  Filled 2020-07-16: qty 0.4

## 2020-07-16 MED ORDER — FAMOTIDINE 20 MG PO TABS
20.0000 mg | ORAL_TABLET | Freq: Two times a day (BID) | ORAL | Status: DC
  Administered 2020-07-16 – 2020-07-17 (×2): 20 mg via ORAL
  Filled 2020-07-16 (×2): qty 1

## 2020-07-16 MED ORDER — GLIPIZIDE 5 MG PO TABS
5.0000 mg | ORAL_TABLET | Freq: Two times a day (BID) | ORAL | Status: DC
  Administered 2020-07-17: 5 mg via ORAL
  Filled 2020-07-16 (×4): qty 1

## 2020-07-16 MED ORDER — METFORMIN HCL 500 MG PO TABS: 1000.0000 mg | ORAL_TABLET | Freq: Two times a day (BID) | ORAL | Status: DC

## 2020-07-16 MED ORDER — AMLODIPINE BESYLATE 5 MG PO TABS
5.0000 mg | ORAL_TABLET | Freq: Every day | ORAL | Status: DC
Start: 2020-07-17 — End: 2020-07-17
  Administered 2020-07-17: 5 mg via ORAL
  Filled 2020-07-16: qty 1

## 2020-07-16 MED ORDER — SODIUM CHLORIDE 0.9 % IV SOLN: INTRAVENOUS | Status: DC
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
## 2020-07-16 NOTE — ED Notes (Signed)
Patient states he has known history of HTN, but has not been taking any of his medication for several months.

## 2020-07-16 NOTE — ED Notes (Signed)
Called x 1 with no answer. 

## 2020-07-17 ENCOUNTER — Observation Stay (HOSPITAL_BASED_OUTPATIENT_CLINIC_OR_DEPARTMENT_OTHER)
Admit: 2020-07-17 | Discharge: 2020-07-17 | Disposition: A | Discharge status: Home / Self Care | Payer: PRIVATE HEALTH INSURANCE | Attending: Physician Assistant | Admitting: Physician Assistant

## 2020-07-17 DIAGNOSIS — R7989 Other specified abnormal findings of blood chemistry: Secondary | ICD-10-CM

## 2020-07-17 DIAGNOSIS — I10 Essential (primary) hypertension: Secondary | ICD-10-CM | POA: Diagnosis not present

## 2020-07-17 LAB — ECHOCARDIOGRAM COMPLETE
AR max vel: 2.07 cm2
AV Area VTI: 2.79 cm2
AV Area mean vel: 1.98 cm2
AV Mean grad: 2 mmHg
AV Peak grad: 4 mmHg
Ao pk vel: 1 m/s
Area-P 1/2: 10.39 cm2
Calc EF: 55.2 %
Height: 66 in
S' Lateral: 3.97 cm
Single Plane A2C EF: 55.5 %
Single Plane A4C EF: 58.2 %
Weight: 2800.72 oz

## 2020-07-17 LAB — HEMOGLOBIN A1C
Hgb A1c MFr Bld: 11.8 % — ABNORMAL HIGH (ref 4.8–5.6)
Mean Plasma Glucose: 291.96 mg/dL

## 2020-07-17 LAB — CBG MONITORING, ED: Glucose-Capillary: 379 mg/dL — ABNORMAL HIGH (ref 70–99)

## 2020-07-17 LAB — HIV ANTIBODY (ROUTINE TESTING W REFLEX): HIV Screen 4th Generation wRfx: NONREACTIVE

## 2020-07-17 LAB — LIPID PANEL
Cholesterol: 196 mg/dL (ref 0–200)
HDL: 31 mg/dL — ABNORMAL LOW (ref >40)
LDL Cholesterol: 109 mg/dL — ABNORMAL HIGH (ref 0–99)
Total CHOL/HDL Ratio: 6.3 RATIO
Triglycerides: 282 mg/dL — ABNORMAL HIGH (ref <150)
VLDL: 56 mg/dL — ABNORMAL HIGH (ref 0–40)

## 2020-07-17 LAB — TROPONIN I (HIGH SENSITIVITY): Troponin I (High Sensitivity): 26 ng/L — ABNORMAL HIGH (ref <18)

## 2020-07-17 MED ORDER — LIVING WELL WITH DIABETES BOOK
Freq: Once | Status: DC
  Filled 2020-07-17 (×2): qty 1

## 2020-07-17 MED ORDER — HYDRALAZINE HCL 50 MG PO TABS
25.0000 mg | ORAL_TABLET | Freq: Three times a day (TID) | ORAL | Status: DC
  Administered 2020-07-17: 25 mg via ORAL
  Filled 2020-07-17: qty 1

## 2020-07-17 MED ORDER — INSULIN ASPART 100 UNIT/ML ~~LOC~~ SOLN
0.0000 [IU] | Freq: Three times a day (TID) | SUBCUTANEOUS | Status: DC
  Administered 2020-07-17: 15 [IU] via SUBCUTANEOUS
  Filled 2020-07-17: qty 1

## 2020-07-17 MED ORDER — INSULIN ASPART 100 UNIT/ML ~~LOC~~ SOLN: 0.0000 [IU] | Freq: Every day | SUBCUTANEOUS | Status: DC

## 2020-07-17 MED ORDER — HYDRALAZINE HCL 25 MG PO TABS: 25.0000 mg | ORAL_TABLET | Freq: Three times a day (TID) | ORAL | 1 refills | Status: DC

## 2020-07-17 MED ORDER — GLIPIZIDE 5 MG PO TABS: 5.0000 mg | ORAL_TABLET | Freq: Two times a day (BID) | ORAL | 1 refills | Status: DC

## 2020-07-17 NOTE — ED Notes (Signed)
Pharmacy contacted for living well with diabetes book

## 2020-07-17 NOTE — Progress Notes (Addendum)
Inpatient Diabetes Program Recommendations  AACE/ADA: New Consensus Statement on Inpatient Glycemic Control   Target Ranges:  Prepandial:   less than 140 mg/dL      Peak postprandial:   less than 180 mg/dL (1-2 hours)      Critically ill patients:  140 - 180 mg/dL   Results for Chad Good, Chad MATTHEW SR. (MRN [id number]) as of 07/17/2020 08:24  Ref. Range 07/16/2020 18:25  Glucose Latest Ref Range: 70 - 99 mg/dL 450 (H)   Review of Glycemic Control  Diabetes history: DM2 Outpatient Diabetes medications: Metformin 1000 mg BID (not taking); per note by C. Rodgers, PA patient reports using an insulin pen sometimes Current orders for Inpatient glycemic control: Glipizide 5 mg BID  Inpatient Diabetes Program Recommendations:    Insulin: Please order CBGs AC&HS with Novolog 0-15 units AC&HS.   HbgA1C: Please consider ordering an A1C to evaluate glycemic control over the past 2-3 months.  NOTE: Noted consult for diabetes coordinator. Diabetes coordinator working remotely. Chart reviewed. Noted patient has DM2 hx and reports not taking Metformin due to GI side effects and per note by C. Rodgers, PA patient sometimes uses an insulin pen. Also noted patient reports hand and foot numbness and initial glucose 422 mg/dl.  Ordered Living Well with DM book. Will plan to call patient and talk with him over the phone.  Addendum 07/17/20@12 :15-Diabetes coordinator working remotely.  Spoke with patient over the phone about diabetes and home regimen for diabetes control. Patient reports he does not have any insurance (has [employer] listed in chart but patient reports that was with prior employer) and he does not have a PCP.  Patient states that he is prescribed Metformin for DM but he does not take it because of GI side effects. Patient states he last took it in October and was in the bathroom all day with diarrhea.  Patient states that he does not check glucose at home and he needs a new glucometer and testing  supplies.  Patient reports that he does not know what his last A1C was.  Discussed initial glucose on labs on 07/16/20 was 422 mg/dl.  Discussed glucose and A1C goals. Discussed importance of checking CBGs and maintaining good CBG control to prevent long-term and short-term complications. Explained how hyperglycemia leads to damage within blood vessels which lead to the common complications seen with uncontrolled diabetes. In discussing complications of uncontroled DM, patient states that he has issues with eyes, erectile dysfunction, numbness in hands, legs, and feet.  Stressed to the patient the importance of improving glycemic control to prevent further complications from uncontrolled diabetes. Patient admits that he needs to do better at taking care of his DM.  Patient reports that he has used Humalog insulin pen in the past to get glucose down. The insulin belonged to a friend and they let him use it. Patient states he is comfortable with using an insulin pen. Patient states that provider at the hospital has told him that he will be discharged on a different oral DM medication. Discussed Glipizide and explained how it works.  Reviewed hypoglycemia along with treatment. Informed patient that Glipizide was an affordable DM medication as it is on the $4 list of DM medications at [facility]. Informed patient that TOC would be consulted to assist with follow up and medications if needed. Will also ask TOC to provide glucometer and testing supplies if available. Encouraged patient to take DM medication as prescribed, follow up with provider as soon as possible,  and be sure to monitor glucose and take glucometer with him to follow up appointments.  Patient verbalized understanding of information discussed and reports no further questions at this time related to diabetes.  Thanks, Orlando Penner, RN, MSN, CDE Diabetes Coordinator Inpatient Diabetes Program [phone] (Team Pager from 8am to 5pm)

## 2020-07-17 NOTE — ED Notes (Signed)
Pt eating lunch at this time

## 2020-07-17 NOTE — Discharge Instructions (Signed)
Advised to follow-up with primary care physician in 1 week. Advised to follow-up with cardiology for outpatient stress test. Advised to take hydralazine 25 mg 3 times daily for blood pressure. Advised to take glipizide 5 mg twice daily for diabetes.
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
## 2020-07-17 NOTE — Progress Notes (Signed)
*  PRELIMINARY RESULTS* Echocardiogram 2D Echocardiogram has been performed.  Cristela Blue 07/17/2020, 11:29 AM

## 2020-10-02 ENCOUNTER — Other Ambulatory Visit (HOSPITAL_COMMUNITY): Payer: Self-pay | Admitting: Student

## 2020-10-02 DIAGNOSIS — I25118 Atherosclerotic heart disease of native coronary artery with other forms of angina pectoris: Secondary | ICD-10-CM

## 2020-10-02 DIAGNOSIS — R9439 Abnormal result of other cardiovascular function study: Secondary | ICD-10-CM

## 2020-10-10 ENCOUNTER — Ambulatory Visit: Admission: RE | Admit: 2020-10-10 | Payer: 59 | Source: Ambulatory Visit

## 2020-10-30 ENCOUNTER — Ambulatory Visit: Admission: RE | Admit: 2020-10-30 | Payer: 59 | Source: Home / Self Care | Admitting: Internal Medicine

## 2020-10-30 ENCOUNTER — Encounter: Admission: RE | Payer: Self-pay | Source: Home / Self Care

## 2020-10-30 DIAGNOSIS — I200 Unstable angina: Secondary | ICD-10-CM

## 2020-10-30 DIAGNOSIS — R079 Chest pain, unspecified: Secondary | ICD-10-CM

## 2020-10-30 DIAGNOSIS — R9439 Abnormal result of other cardiovascular function study: Secondary | ICD-10-CM

## 2020-10-30 SURGERY — LEFT HEART CATH AND CORONARY ANGIOGRAPHY
Anesthesia: Moderate Sedation

## 2020-11-19 ENCOUNTER — Encounter: Payer: Self-pay | Admitting: *Deleted

## 2020-11-19 ENCOUNTER — Other Ambulatory Visit: Payer: Self-pay

## 2020-11-19 ENCOUNTER — Encounter: Payer: 59 | Attending: Internal Medicine | Admitting: *Deleted

## 2020-11-19 VITALS — BP 130/76 | Ht 66.0 in | Wt 184.8 lb

## 2020-11-19 DIAGNOSIS — E1165 Type 2 diabetes mellitus with hyperglycemia: Secondary | ICD-10-CM | POA: Diagnosis present

## 2020-11-19 NOTE — Patient Instructions (Addendum)
Check blood sugars 2 x day before breakfast and before supper every day Bring blood sugar records to the next appointment  Exercise: Continue walking  for  20  minutes 3  days a week and gradually increase to 30 minutes 5 x week  Eat 3 meals day,  1-2  snacks a day Space meals 4-6 hours apart Jadarius't skip meals - eat 1 protein and 1 carbohydrate serving Avoid sugar sweetened drinks (soda, tea) unless treating a low blood sugar Limit fried foods  Make an eye doctor appointment  Carry fast acting glucose and a snack at all times Rotate injection sites  Return for appointment on:  Thursday December 12, 2020 at 3:00 pm with [employer] (dietitian)

## 2020-11-20 ENCOUNTER — Other Ambulatory Visit: Payer: Self-pay

## 2020-11-20 ENCOUNTER — Emergency Department: Payer: 59

## 2020-11-20 ENCOUNTER — Emergency Department
Admission: EM | Admit: 2020-11-20 | Discharge: 2020-11-20 | Disposition: A | Discharge status: Home / Self Care | Payer: 59 | Attending: Emergency Medicine | Admitting: Emergency Medicine

## 2020-11-20 DIAGNOSIS — M545 Low back pain, unspecified: Secondary | ICD-10-CM | POA: Insufficient documentation

## 2020-11-20 DIAGNOSIS — Z794 Long term (current) use of insulin: Secondary | ICD-10-CM | POA: Diagnosis not present

## 2020-11-20 DIAGNOSIS — I10 Essential (primary) hypertension: Secondary | ICD-10-CM | POA: Diagnosis not present

## 2020-11-20 DIAGNOSIS — I2510 Atherosclerotic heart disease of native coronary artery without angina pectoris: Secondary | ICD-10-CM | POA: Diagnosis not present

## 2020-11-20 DIAGNOSIS — E1165 Type 2 diabetes mellitus with hyperglycemia: Secondary | ICD-10-CM | POA: Diagnosis not present

## 2020-11-20 DIAGNOSIS — Z7982 Long term (current) use of aspirin: Secondary | ICD-10-CM | POA: Insufficient documentation

## 2020-11-20 DIAGNOSIS — F17290 Nicotine dependence, other tobacco product, uncomplicated: Secondary | ICD-10-CM | POA: Diagnosis not present

## 2020-11-20 DIAGNOSIS — Z79899 Other long term (current) drug therapy: Secondary | ICD-10-CM | POA: Diagnosis not present

## 2020-11-20 DIAGNOSIS — R739 Hyperglycemia, unspecified: Secondary | ICD-10-CM

## 2020-11-20 DIAGNOSIS — Z7984 Long term (current) use of oral hypoglycemic drugs: Secondary | ICD-10-CM | POA: Diagnosis not present

## 2020-11-20 DIAGNOSIS — R109 Unspecified abdominal pain: Secondary | ICD-10-CM | POA: Diagnosis present

## 2020-11-20 DIAGNOSIS — R079 Chest pain, unspecified: Secondary | ICD-10-CM

## 2020-11-20 LAB — CBC
HCT: 33.5 % — ABNORMAL LOW (ref 39.0–52.0)
Hemoglobin: 12.1 g/dL — ABNORMAL LOW (ref 13.0–17.0)
MCH: 32.7 pg (ref 26.0–34.0)
MCHC: 36.1 g/dL — ABNORMAL HIGH (ref 30.0–36.0)
MCV: 90.5 fL (ref 80.0–100.0)
Platelets: 273 10*3/uL (ref 150–400)
RBC: 3.7 MIL/uL — ABNORMAL LOW (ref 4.22–5.81)
RDW: 13.4 % (ref 11.5–15.5)
WBC: 10.4 10*3/uL (ref 4.0–10.5)
nRBC: 0 % (ref 0.0–0.2)

## 2020-11-20 LAB — URINALYSIS, COMPLETE (UACMP) WITH MICROSCOPIC
Bacteria, UA: NONE SEEN
Bilirubin Urine: NEGATIVE
Glucose, UA: 50 mg/dL — AB
Ketones, ur: NEGATIVE mg/dL
Leukocytes,Ua: NEGATIVE
Nitrite: NEGATIVE
Protein, ur: NEGATIVE mg/dL
Specific Gravity, Urine: 1.018 (ref 1.005–1.030)
pH: 5 (ref 5.0–8.0)

## 2020-11-20 LAB — BASIC METABOLIC PANEL
Anion gap: 8 (ref 5–15)
BUN: 15 mg/dL (ref 6–20)
CO2: 23 mmol/L (ref 22–32)
Calcium: 9.7 mg/dL (ref 8.9–10.3)
Chloride: 105 mmol/L (ref 98–111)
Creatinine, Ser: 1.01 mg/dL (ref 0.61–1.24)
GFR, Estimated: >60 mL/min (ref >60)
Glucose, Bld: 165 mg/dL — ABNORMAL HIGH (ref 70–99)
Potassium: 3.8 mmol/L (ref 3.5–5.1)
Sodium: 136 mmol/L (ref 135–145)

## 2020-11-20 LAB — TROPONIN I (HIGH SENSITIVITY)
Troponin I (High Sensitivity): 20 ng/L — ABNORMAL HIGH (ref <18)
Troponin I (High Sensitivity): 20 ng/L — ABNORMAL HIGH (ref <18)

## 2020-11-20 MED ORDER — SUCRALFATE 1 G PO TABS
1.0000 g | ORAL_TABLET | Freq: Once | ORAL | Status: AC
  Administered 2020-11-20: 1 g via ORAL
  Filled 2020-11-20: qty 1

## 2020-11-20 MED ORDER — PANTOPRAZOLE SODIUM 40 MG PO TBEC
40.0000 mg | DELAYED_RELEASE_TABLET | Freq: Once | ORAL | Status: AC
  Administered 2020-11-20: 40 mg via ORAL
  Filled 2020-11-20: qty 1

## 2020-11-20 MED ORDER — ALUM & MAG HYDROXIDE-SIMETH 200-200-20 MG/5ML PO SUSP
30.0000 mL | Freq: Once | ORAL | Status: AC
  Administered 2020-11-20: 30 mL via ORAL
  Filled 2020-11-20: qty 30

## 2020-11-20 MED ORDER — ACETAMINOPHEN 500 MG PO TABS
1000.0000 mg | ORAL_TABLET | Freq: Once | ORAL | Status: AC
  Administered 2020-11-20: 1000 mg via ORAL
  Filled 2020-11-20: qty 2

## 2020-11-20 NOTE — Progress Notes (Signed)
Diabetes Self-Management Education  Visit Type: First/Initial  Appt. Start Time: 1305 Appt. End Time: 1420  11/19/2020  Chad Good, identified by name and date of birth, is a 60 y.o. male with a diagnosis of Diabetes: Type 2.   ASSESSMENT  Blood pressure 130/76, height 5\' 6"  (1.676 m), weight 184 lb 12.8 oz (83.8 kg). Body mass index is 29.83 kg/m.   Diabetes Self-Management Education - 11/19/20 1422      Visit Information   Visit Type First/Initial      Initial Visit   Diabetes Type Type 2    Are you currently following a meal plan? No    Are you taking your medications as prescribed? Yes    Date Diagnosed 12 years ago      Health Coping   How would you rate your overall health? Fair      Psychosocial Assessment   Patient Belief/Attitude about Diabetes Other (comment)   "i Tatsuo't like it"   Self-care barriers None    Self-management support Doctor's office;Friends    Patient Concerns Nutrition/Meal planning;Medication;Monitoring;Healthy Lifestyle;Problem Solving;Glycemic Control;Weight Control    Special Needs None    Preferred Learning Style Other (comment)   talking   Learning Readiness Contemplating    How often do you need to have someone help you when you read instructions, pamphlets, or other written materials from your doctor or pharmacy? 1 - Never    What is the last grade level you completed in school? 1 year college      Pre-Education Assessment   Patient understands the diabetes disease and treatment process. Needs Instruction    Patient understands incorporating nutritional management into lifestyle. Needs Instruction    Patient undertands incorporating physical activity into lifestyle. Needs Review    Patient understands using medications safely. Needs Review    Patient understands monitoring blood glucose, interpreting and using results Needs Review    Patient understands prevention, detection, and treatment of acute complications. Needs Review     Patient understands prevention, detection, and treatment of chronic complications. Needs Review    Patient understands how to develop strategies to address psychosocial issues. Needs Instruction    Patient understands how to develop strategies to promote health/change behavior. Needs Instruction      Complications   Last HgB A1C per patient/outside source 10.4 %   11/07/2020   How often do you check your blood sugar? 0 times/day (not testing)   He reports blood sugar of 99 mg/dL 2 days ago and 01/07/2021 mg/dL on 235'T. he reports he doesn't like to check his blood sugar because it is depressing.   Have you had a dilated eye exam in the past 12 months? No    Have you had a dental exam in the past 12 months? No    Are you checking your feet? Yes    How many days per week are you checking your feet? 7      Dietary Intake   Breakfast reports 1-2 meals/day and eats out for most meals - skips or eats eggs, cereal and milk    Snack (morning) vienna sausages, chips, fruit cup    Lunch burger, fries, chicken wings, 7/32    Dinner skips or eats out - steak, chicken alfredo, baked potatoes, broccoli and cheese, fish, peas, beans, corn, rice, pasta, salad with lettuce, tomatoes, cuccumbers    Beverage(s) water, regular soda, sweet tea      Exercise   Exercise Type Light (walking / raking leaves)  How many days per week to you exercise? 3    How many minutes per day do you exercise? 20    Total minutes per week of exercise 60      Patient Education   Previous Diabetes Education No    Disease state  Definition of diabetes, type 1 and 2, and the diagnosis of diabetes;Explored patient's options for treatment of their diabetes    Nutrition management  Role of diet in the treatment of diabetes and the relationship between the three main macronutrients and blood glucose level;Food label reading, portion sizes and measuring food.;Reviewed blood glucose goals for pre and post meals and how to evaluate the  patients' food intake on their blood glucose level.;Meal timing in regards to the patients' current diabetes medication.;Information on hints to eating out and maintain blood glucose control.    Physical activity and exercise  Role of exercise on diabetes management, blood pressure control and cardiac health.    Medications Taught/reviewed insulin injection, site rotation, insulin storage and needle disposal.;Reviewed patients medication for diabetes, action, purpose, timing of dose and side effects. pt has been shaking his insulin   Monitoring Purpose and frequency of SMBG.;Taught/discussed recording of test results and interpretation of SMBG.;Identified appropriate SMBG and/or A1C goals.;Yearly dilated eye exam    Acute complications Taught treatment of hypoglycemia - the 15 rule.    Chronic complications Relationship between chronic complications and blood glucose control    Psychosocial adjustment Identified and addressed patients feelings and concerns about diabetes    Personal strategies to promote health Review risk of smoking and offered smoking cessation      Individualized Goals (developed by patient)   Reducing Risk Other (comment)   improve blood sugars, decrease medications, prevent diabetes complications, lead a healthier lifestyle, quit smoking, become more fit     Outcomes   Expected Outcomes Demonstrated interest in learning. Expect positive outcomes    Future DMSE 4-6 wks           Individualized Plan for Diabetes Self-Management Training:   Learning Objective:  Patient will have a greater understanding of diabetes self-management. Patient education plan is to attend individual and/or group sessions per assessed needs and concerns.   Plan:   Patient Instructions  Check blood sugars 2 x day before breakfast and before supper every day Bring blood sugar records to the next appointment  Exercise: Continue walking  for  20  minutes 3  days a week and gradually increase to  30 minutes 5 x week  Eat 3 meals day,  1-2  snacks a day Space meals 4-6 hours apart [place]'t skip meals - eat 1 protein and 1 carbohydrate serving Avoid sugar sweetened drinks (soda, tea) unless treating a low blood sugar Limit fried foods  Make an eye doctor appointment  Carry fast acting glucose and a snack at all times Rotate injection sites  Return for appointment on:  Thursday December 12, 2020 at 3:00 pm with Pam (dietitian)  Expected Outcomes:  Demonstrated interest in learning. Expect positive outcomes  Education material provided:  General Meal Planning Guidelines Simple Meal Plan Glucose tablets Symptoms, causes and treatments of Hypoglycemia Healthy injection site selection and rotation  If problems or questions, patient to contact team via:  Sharion Settler, RN, CCM, CDCES [phone]  Future DSME appointment: 4-6 wks  Pt was not interested in Diabetes Classes but will attend the 2 Hour Refresher Program and his next appointment is scheduled on December 12, 2020 with the dietitian.

## 2020-11-20 NOTE — ED Provider Notes (Signed)
[facility] Emergency Department Provider Note  ____________________________________________   Event Date/Time   First MD Initiated Contact with Patient 11/20/20 1651     (approximate)  I have reviewed the triage vital signs and the nursing notes.   HISTORY  Chief Complaint Chest Pain   HPI Chad Mcfadden Sr. is a 60 y.o. male with a past medical history of HTN, DM, peptic ulcer disease, neuropathy and CAD who presents for assessment approximately 3 days of some left-sided flank pain and left lower back pain associate with some palpitations.  Patient states his urine has been very [place] as well is not having burning with urination or any blood in his urine.  States the pain seems to radiate around to the left side of his abdomen only denies any right-sided dental pain.  He denies any separate chest pain, cough, shortness of breath, headache, earache, sore throat, fevers, chills, vomiting, rash or extremity pain.  No other acute concerns at this time.  No history of kidney stones.  No recent injuries or falls to explain his pain.  Does endorse some chronic pain in his right leg from a remote fall.         Past Medical History:  Diagnosis Date  . Diabetes mellitus without complication (HCC)   . Hypertension   . Stomach ulcer     Patient Active Problem List   Diagnosis Date Noted  . DM2 (diabetes mellitus, type 2) (HCC) 07/16/2020  . HTN (hypertension) 07/16/2020  . Troponin level elevated 07/16/2020  . Elevated troponin 07/16/2020    Past Surgical History:  Procedure Laterality Date  . COLONOSCOPY    . FEMUR CLOSED REDUCTION      Prior to Admission medications   Medication Sig Start Date End Date Taking? Authorizing Provider  aspirin 81 MG EC tablet Take 81 mg by mouth daily. 09/24/20   [provider]  CINNAMON PO Take 1,000 mg by mouth daily.    [provider]  glipiZIDE-metformin (METAGLIP) 5-500 MG tablet Take 1 tablet by  mouth 2 (two) times daily. 09/24/20   [provider]  insulin NPH-regular Human (70-30) 100 UNIT/ML injection Inject 15 Units into the skin in the morning and at bedtime. 11/07/20 11/07/21  [provider]  isosorbide mononitrate (IMDUR) 30 MG 24 hr tablet Take 30 mg by mouth daily.    [provider]  losartan (COZAAR) 25 MG tablet Take 50 mg by mouth daily. 11/07/20   [provider]  metoprolol tartrate (LOPRESSOR) 25 MG tablet Take 12.5 mg by mouth 2 (two) times daily. 11/07/20   [provider]  simvastatin (ZOCOR) 40 MG tablet Take 40 mg by mouth at bedtime. 09/30/20   [provider]  tiZANidine (ZANAFLEX) 4 MG tablet Take 4 mg by mouth at bedtime as needed. 09/24/20   [provider]  Vitamin D, Ergocalciferol, (DRISDOL) 1.25 MG (50000 UNIT) CAPS capsule Take 1 capsule by mouth once a week. 09/25/20   [provider]    Allergies Patient has no known allergies.  Family History  Problem Relation Age of Onset  . Diabetes Father     Social History Social History   Tobacco Use  . Smoking status: Current Every Day Smoker    Packs/day: 0.00    Types: Cigars  . Smokeless tobacco: Never Used  . Tobacco comment: 2-3 cigars/day  Substance Use Topics  . Alcohol use: No  . Drug use: No    Review of Systems  Review of Systems  Constitutional: Negative for chills and fever.  HENT: Negative for sore throat.   Eyes: Negative for pain.  Respiratory: Negative for cough and stridor.   Cardiovascular: Positive for palpitations.  Gastrointestinal: Positive for abdominal pain. Negative for vomiting.  Genitourinary: Positive for flank pain ( L ).  Musculoskeletal: Positive for back pain ( L lower).  Skin: Negative for rash.  Neurological: Negative for seizures, loss of consciousness and headaches.  Psychiatric/Behavioral: Negative for suicidal ideas.  All other systems reviewed and are negative.      ____________________________________________   PHYSICAL EXAM:  VITAL SIGNS: ED Triage Vitals  Enc Vitals Group     BP 11/20/20 1608 (!) 166/91     Pulse Rate 11/20/20 1608 81     Resp 11/20/20 1608 16     Temp 11/20/20 1608 98.4 F (36.9 C)     Temp Source 11/20/20 1608 Oral     SpO2 11/20/20 1608 98 %     Weight --      Height --      Head Circumference --      Peak Flow --      Pain Score 11/20/20 1613 10     Pain Loc --      Pain Edu? --      Excl. in GC? --    Vitals:   11/20/20 1608 11/20/20 1730  BP: (!) 166/91 (!) 159/96  Pulse: 81 83  Resp: 16 18  Temp: 98.4 F (36.9 C)   SpO2: 98% 97%   Physical Exam Vitals and nursing note reviewed.  Constitutional:      Appearance: He is well-developed.  HENT:     Head: Normocephalic and atraumatic.     Right Ear: External ear normal.     Left Ear: External ear normal.     Nose: Nose normal.  Eyes:     Conjunctiva/sclera: Conjunctivae normal.  Cardiovascular:     Rate and Rhythm: Normal rate and regular rhythm.     Heart sounds: No murmur heard.   Pulmonary:     Effort: Pulmonary effort is normal. No respiratory distress.     Breath sounds: Normal breath sounds.  Abdominal:     Palpations: Abdomen is soft.     Tenderness: There is no abdominal tenderness. There is no right CVA tenderness or left CVA tenderness.  Musculoskeletal:     Cervical back: Neck supple.  Skin:    General: Skin is warm and dry.     Capillary Refill: Capillary refill takes less than 2 seconds.  Neurological:     Mental Status: He is alert and oriented to person, place, and time.  Psychiatric:        Mood and Affect: Mood normal.     Cranial nerves II through XII grossly intact.  Patient has full strength of his bilateral upper and lower extremities.  Sensation intact light touch in all extremities. ____________________________________________   LABS (all labs ordered are listed, but only abnormal results are  displayed)  Labs Reviewed  BASIC METABOLIC PANEL - Abnormal; Notable for the following components:      Result Value   Glucose, Bld 165 (*)    All other components within normal limits  CBC - Abnormal; Notable for the following components:   RBC 3.70 (*)    Hemoglobin 12.1 (*)    HCT 33.5 (*)    MCHC 36.1 (*)    All other components within normal limits  URINALYSIS, COMPLETE (UACMP) WITH  MICROSCOPIC - Abnormal; Notable for the following components:   Color, Urine YELLOW (*)    APPearance CLEAR (*)    Glucose, UA 50 (*)    Hgb urine dipstick SMALL (*)    All other components within normal limits  TROPONIN I (HIGH SENSITIVITY) - Abnormal; Notable for the following components:   Troponin I (High Sensitivity) 20 (*)    All other components within normal limits  TROPONIN I (HIGH SENSITIVITY) - Abnormal; Notable for the following components:   Troponin I (High Sensitivity) 20 (*)    All other components within normal limits   ____________________________________________  EKG  Sinus rhythm with a ventricular rate of 80, normal axis, nonspecific ST changes in inferior leads without other clear evidence of acute ischemia or other significant arrhythmia. ____________________________________________  RADIOLOGY  ED MD interpretation: Chest x-ray has no focal consolidation, effusion, edema, no thorax or any other clear acute intrathoracic process.  CT stone study shows no evidence of kidney stone, diverticulitis or other clear acute abdominal pelvic process but there is evidence of aortic atherosclerosis.  No evidence of AAA.  Official radiology report(s): DG Chest 2 View  Result Date: 11/20/2020 CLINICAL DATA:  Chest pain. EXAM: CHEST - 2 VIEW COMPARISON:  July 16, 2020. FINDINGS: The heart size and mediastinal contours are within normal limits. Both lungs are clear. The visualized skeletal structures are unremarkable. IMPRESSION: No active cardiopulmonary disease. Electronically  Signed   By: Lupita RaiderJames  Green Jr M.D.   On: 11/20/2020 16:54   CT Renal Stone Study  Result Date: 11/20/2020 CLINICAL DATA:  Left-sided chest pain with bladder and kidney pain. EXAM: CT ABDOMEN AND PELVIS WITHOUT CONTRAST TECHNIQUE: Multidetector CT imaging of the abdomen and pelvis was performed following the standard protocol without IV contrast. COMPARISON:  August 05, 2018 FINDINGS: Lower chest: No acute abnormality. Hepatobiliary: No focal liver abnormality is seen. No gallstones, gallbladder wall thickening, or biliary dilatation. Pancreas: Unremarkable. No pancreatic ductal dilatation or surrounding inflammatory changes. Spleen: Normal in size without focal abnormality. Adrenals/Urinary Tract: Adrenal glands are unremarkable. Kidneys are normal, without renal calculi, focal lesion, or hydronephrosis. Bladder is unremarkable. Stomach/Bowel: Stomach is within normal limits. Appendix appears normal. No evidence of bowel wall thickening, distention, or inflammatory changes. Vascular/Lymphatic: Aortic atherosclerosis. No enlarged abdominal or pelvic lymph nodes. Reproductive: The prostate gland is mildly enlarged Other: No abdominal wall hernia or abnormality. No abdominopelvic ascites. Musculoskeletal: No acute or significant osseous findings. IMPRESSION: No acute or active process within the abdomen or pelvis. Electronically Signed   By: Aram Candelahaddeus  Houston M.D.   On: 11/20/2020 17:37    ____________________________________________   PROCEDURES  Procedure(s) performed (including Critical Care):  Procedures   ____________________________________________   INITIAL IMPRESSION / ASSESSMENT AND PLAN / ED COURSE     Patient presents with above-stated history and exam for assessment approximately 3 days of some left lower back pain rating around the left flank to the left side of the abdomen associate with some palpitations and [place] urine.  On arrival he is hypertensive with a BP of 166/91 with  otherwise stable vital signs on room air.  He does have some mild left CVA tenderness abdomen is soft nontender throughout.  Exam is otherwise unremarkable.  Differential includes possible kidney stone, pyelonephritis, AAA, atypical presentation of ACS, cystitis and diverticulitis.  ECG has some nonspecific changes but given troponin minimally elevated at 20 and stable over 2 hours low suspicion for ACS or myocarditis at this time.  Low suspicion for PE  as patient is not hypoxic, tachycardic, tachypneic and has no clear risk factors for this.  In addition he denies any shortness of breath..  Chest x-ray has no evidence of left lower lobe pneumonia or any other clear acute abnormality explain patient's symptoms.  CT stone study shows no evidence of kidney stone, diverticulitis or other clear acute abdominal pelvic process but there is evidence of aortic atherosclerosis.  No evidence of AAA.  BMP shows no significant other metabolic derangements although glucose is little elevated at 165.  CBC shows no leukocytosis or acute anemia.  UA has small hemoglobin and some glucose but no evidence of infection.  Certainly possible patient symptoms are related to peptic ulcer disease although given stable vitals otherwise reassuring exam and work-up patient denying any pain on my assessment think he is safe for discharge.  Continue outpatient evaluation.  Discharged stable condition.  Strict return cautions advised and discussed.       ____________________________________________   FINAL CLINICAL IMPRESSION(S) / ED DIAGNOSES  Final diagnoses:  Chest pain, unspecified type  Acute left-sided low back pain, unspecified whether sciatica present  Hypertension, unspecified type  Hyperglycemia    Medications  alum & mag hydroxide-simeth (MAALOX/MYLANTA) 200-200-20 MG/5ML suspension 30 mL (has no administration in time range)  acetaminophen (TYLENOL) tablet 1,000 mg (has no administration in time range)   sucralfate (CARAFATE) tablet 1 g (has no administration in time range)  pantoprazole (PROTONIX) EC tablet 40 mg (has no administration in time range)     ED Discharge Orders    None       Note:  This document was prepared using Dragon voice recognition software and may include unintentional dictation errors.   Gilles Chiquito, MD 11/20/20 [facility]

## 2020-11-20 NOTE — ED Notes (Signed)
Pt's SO c/o sharp CP, pt denies CP, repeat EKG obtained by this RN. EDP aware at this time. Pt visualized in NAD at this time.

## 2020-11-20 NOTE — ED Triage Notes (Signed)
Pt comes with c/o left sided CP that started yesterday. Pt also states some bladder and kidney pain. Pt states it is sharp shooting pain when he lays down.

## 2020-11-20 NOTE — ED Notes (Signed)
Pt c/o L sided flank pain and foul smelling uring x several days. Pt states "it just won't let go". Pt's wife states concerns due to patient recently having had a heart attack. Pt visualized in NAD at this time. Pt A&O x4, visualized playing on cell phone on bed with wife at bedside.

## 2020-12-11 ENCOUNTER — Telehealth (HOSPITAL_COMMUNITY): Payer: Self-pay | Admitting: Emergency Medicine

## 2020-12-11 NOTE — Telephone Encounter (Signed)
Reaching out to patient to offer assistance regarding upcoming cardiac imaging study; pt verbalizes understanding of appt date/time, parking situation and where to check in, pre-test NPO status and medications ordered, and verified current allergies; name and call back number provided for further questions should they arise Rockwell Alexandria RN Navigator Cardiac Imaging [facility] [phone] office [phone] cell  Pt instructed to take 25mg  metoprolol tartrate 

## 2020-12-12 ENCOUNTER — Ambulatory Visit: Payer: 59 | Admitting: Dietician

## 2020-12-12 ENCOUNTER — Other Ambulatory Visit: Payer: Self-pay

## 2020-12-12 ENCOUNTER — Ambulatory Visit
Admission: RE | Admit: 2020-12-12 | Discharge: 2020-12-12 | Disposition: A | Discharge status: Home / Self Care | Payer: 59 | Source: Ambulatory Visit | Attending: Student | Admitting: Student

## 2020-12-12 DIAGNOSIS — R9439 Abnormal result of other cardiovascular function study: Secondary | ICD-10-CM | POA: Diagnosis present

## 2020-12-12 DIAGNOSIS — I25118 Atherosclerotic heart disease of native coronary artery with other forms of angina pectoris: Secondary | ICD-10-CM

## 2020-12-12 MED ORDER — NITROGLYCERIN 0.4 MG SL SUBL
0.8000 mg | SUBLINGUAL_TABLET | Freq: Once | SUBLINGUAL | Status: AC
  Administered 2020-12-12: 0.8 mg via SUBLINGUAL

## 2020-12-12 MED ORDER — DILTIAZEM HCL 25 MG/5ML IV SOLN
10.0000 mg | Freq: Once | INTRAVENOUS | Status: AC
  Administered 2020-12-12: 10 mg via INTRAVENOUS

## 2020-12-12 MED ORDER — METOPROLOL TARTRATE 5 MG/5ML IV SOLN
10.0000 mg | Freq: Once | INTRAVENOUS | Status: AC
  Administered 2020-12-12: 10 mg via INTRAVENOUS

## 2020-12-12 MED ORDER — IOHEXOL 350 MG/ML SOLN
75.0000 mL | Freq: Once | INTRAVENOUS | Status: AC | PRN
  Administered 2020-12-12: 75 mL via INTRAVENOUS

## 2020-12-12 MED ORDER — DILTIAZEM HCL 25 MG/5ML IV SOLN
5.0000 mg | Freq: Once | INTRAVENOUS | Status: AC
  Administered 2020-12-12: 5 mg via INTRAVENOUS

## 2020-12-12 NOTE — Progress Notes (Signed)
Patient tolerated procedure well. Ambulate w/o difficulty. Sitting in chair drinking water provided. Encouraged to drink extra water today and reasoning explained. Verbalized understanding. All questions answered. ABC intact. No further needs. Discharge from procedure area w/o issues.  

## 2020-12-25 ENCOUNTER — Encounter: Payer: Self-pay | Admitting: Dietician

## 2020-12-25 NOTE — Progress Notes (Signed)
Patient cancelled his Diabetes Refresher appt with RD on 12/12/20 due to having cardiac catheterization. He stated he would reschedule at a later time.

## 2021-01-16 ENCOUNTER — Encounter: Payer: Self-pay | Admitting: Dietician

## 2021-01-16 NOTE — Progress Notes (Signed)
Have not heard back from patient to reschedule his cancelled appointment from 12/12/20. Sent notification to referring provider.

## 2021-04-03 ENCOUNTER — Other Ambulatory Visit: Payer: Self-pay

## 2021-04-03 ENCOUNTER — Encounter: Payer: Self-pay | Admitting: Emergency Medicine

## 2021-04-03 ENCOUNTER — Emergency Department
Admission: EM | Admit: 2021-04-03 | Discharge: 2021-04-03 | Disposition: A | Discharge status: Home / Self Care | Payer: 59 | Attending: Emergency Medicine | Admitting: Emergency Medicine

## 2021-04-03 ENCOUNTER — Emergency Department: Payer: 59

## 2021-04-03 DIAGNOSIS — I10 Essential (primary) hypertension: Secondary | ICD-10-CM | POA: Insufficient documentation

## 2021-04-03 DIAGNOSIS — Z7984 Long term (current) use of oral hypoglycemic drugs: Secondary | ICD-10-CM | POA: Diagnosis not present

## 2021-04-03 DIAGNOSIS — Z7982 Long term (current) use of aspirin: Secondary | ICD-10-CM | POA: Insufficient documentation

## 2021-04-03 DIAGNOSIS — K2900 Acute gastritis without bleeding: Secondary | ICD-10-CM | POA: Insufficient documentation

## 2021-04-03 DIAGNOSIS — F17210 Nicotine dependence, cigarettes, uncomplicated: Secondary | ICD-10-CM | POA: Insufficient documentation

## 2021-04-03 DIAGNOSIS — R079 Chest pain, unspecified: Secondary | ICD-10-CM | POA: Diagnosis present

## 2021-04-03 DIAGNOSIS — Z20822 Contact with and (suspected) exposure to covid-19: Secondary | ICD-10-CM | POA: Diagnosis not present

## 2021-04-03 DIAGNOSIS — Z79899 Other long term (current) drug therapy: Secondary | ICD-10-CM | POA: Diagnosis not present

## 2021-04-03 DIAGNOSIS — E119 Type 2 diabetes mellitus without complications: Secondary | ICD-10-CM | POA: Diagnosis not present

## 2021-04-03 DIAGNOSIS — Z794 Long term (current) use of insulin: Secondary | ICD-10-CM | POA: Diagnosis not present

## 2021-04-03 DIAGNOSIS — R0789 Other chest pain: Secondary | ICD-10-CM

## 2021-04-03 HISTORY — DX: Pure hypercholesterolemia, unspecified: E78.00

## 2021-04-03 LAB — BASIC METABOLIC PANEL
Anion gap: 7 (ref 5–15)
BUN: 16 mg/dL (ref 6–20)
CO2: 29 mmol/L (ref 22–32)
Calcium: 9.1 mg/dL (ref 8.9–10.3)
Chloride: 101 mmol/L (ref 98–111)
Creatinine, Ser: 1.1 mg/dL (ref 0.61–1.24)
GFR, Estimated: >60 mL/min (ref >60)
Glucose, Bld: 407 mg/dL — ABNORMAL HIGH (ref 70–99)
Potassium: 4.3 mmol/L (ref 3.5–5.1)
Sodium: 137 mmol/L (ref 135–145)

## 2021-04-03 LAB — CBC
Hemoglobin: 13.4 g/dL (ref 13.0–17.0)
Platelets: 216 10*3/uL (ref 150–400)
WBC: 7.7 10*3/uL (ref 4.0–10.5)

## 2021-04-03 LAB — HEPATIC FUNCTION PANEL
ALT: 28 U/L (ref 0–44)
AST: 22 U/L (ref 15–41)
Albumin: 3.3 g/dL — ABNORMAL LOW (ref 3.5–5.0)
Alkaline Phosphatase: 144 U/L — ABNORMAL HIGH (ref 38–126)
Bilirubin, Direct: <0.1 mg/dL (ref 0.0–0.2)
Total Bilirubin: 0.7 mg/dL (ref 0.3–1.2)
Total Protein: 6.3 g/dL — ABNORMAL LOW (ref 6.5–8.1)

## 2021-04-03 LAB — RESP PANEL BY RT-PCR (FLU A&B, COVID) ARPGX2
Influenza A by PCR: NEGATIVE
Influenza B by PCR: NEGATIVE
SARS Coronavirus 2 by RT PCR: NEGATIVE

## 2021-04-03 LAB — TROPONIN I (HIGH SENSITIVITY)
Troponin I (High Sensitivity): 18 ng/L — ABNORMAL HIGH (ref <18)
Troponin I (High Sensitivity): 19 ng/L — ABNORMAL HIGH (ref <18)

## 2021-04-03 LAB — LIPASE, BLOOD: Lipase: 29 U/L (ref 11–51)

## 2021-04-03 MED ORDER — FAMOTIDINE 20 MG PO TABS: 20.0000 mg | ORAL_TABLET | Freq: Two times a day (BID) | ORAL | 0 refills | Status: DC

## 2021-04-03 MED ORDER — METOCLOPRAMIDE HCL 5 MG/ML IJ SOLN
10.0000 mg | Freq: Once | INTRAMUSCULAR | Status: AC
  Administered 2021-04-03: 10 mg via INTRAVENOUS
  Filled 2021-04-03: qty 2

## 2021-04-03 MED ORDER — METOCLOPRAMIDE HCL 10 MG PO TABS
10.0000 mg | ORAL_TABLET | Freq: Four times a day (QID) | ORAL | 0 refills | Status: DC | PRN
Start: 2021-04-03 — End: 2021-09-08

## 2021-04-03 MED ORDER — ALUM & MAG HYDROXIDE-SIMETH 200-200-20 MG/5ML PO SUSP
30.0000 mL | Freq: Once | ORAL | Status: AC
  Administered 2021-04-03: 30 mL via ORAL
  Filled 2021-04-03: qty 30

## 2021-04-03 MED ORDER — SUCRALFATE 1 G PO TABS: 1.0000 g | ORAL_TABLET | Freq: Four times a day (QID) | ORAL | 1 refills | Status: DC

## 2021-04-03 MED ORDER — FAMOTIDINE 20 MG PO TABS
40.0000 mg | ORAL_TABLET | Freq: Once | ORAL | Status: AC
  Administered 2021-04-03: 40 mg via ORAL
  Filled 2021-04-03: qty 2

## 2021-04-03 NOTE — ED Triage Notes (Signed)
Also c/o whole body hurting.  Patient states "I think my body is just shutting down".   AAOx3.  Skin warm and dry. NAD.  NO SOB/ DOE

## 2021-04-03 NOTE — ED Notes (Signed)
Pt to ED c/o intermittent sharp chest pain over last 2 days, unable to describe pain, denies pain as crushing or radiating, in NAD at this time. Denies pain worse related to food. Resting in bed. Labs drawn.

## 2021-04-03 NOTE — ED Triage Notes (Signed)
First Nurse Note:  C/O intermittent left sided chest pain radiating to left arm.

## 2021-04-03 NOTE — ED Provider Notes (Signed)
[facility] Emergency Department Provider Note  ____________________________________________  Time seen: Approximately 8:28 AM  I have reviewed the triage vital signs and the nursing notes.   HISTORY  Chief Complaint Chest Pain    HPI Chad Spielmann Sr. is a 60 y.o. male with a history of hypertension hyperlipidemia diabetes and a stomach ulcer who comes ED complaining of left-sided chest pain which is sharp, intermittent, occurring for the last 5 days, worse at night while laying down, better with taking aspirin.  Not exertional, not pleuritic.  Nonradiating.  No shortness of breath diaphoresis or vomiting.  Reports a loss of appetite recently as well as well as some chills and body aches.    Past Medical History:  Diagnosis Date   Diabetes mellitus without complication (HCC)    High cholesterol    Hypertension    Stomach ulcer      Patient Active Problem List   Diagnosis Date Noted   DM2 (diabetes mellitus, type 2) (HCC) 07/16/2020   HTN (hypertension) 07/16/2020   Troponin level elevated 07/16/2020   Elevated troponin 07/16/2020     Past Surgical History:  Procedure Laterality Date   COLONOSCOPY     FEMUR CLOSED REDUCTION       Prior to Admission medications   Medication Sig Start Date End Date Taking? Authorizing Provider  famotidine (PEPCID) 20 MG tablet Take 1 tablet (20 mg total) by mouth 2 (two) times daily. 04/03/21  Yes Sharman Cheek, MD  metoCLOPramide (REGLAN) 10 MG tablet Take 1 tablet (10 mg total) by mouth every 6 (six) hours as needed. 04/03/21  Yes Sharman Cheek, MD  sucralfate (CARAFATE) 1 g tablet Take 1 tablet (1 g total) by mouth 4 (four) times daily. 04/03/21  Yes Sharman Cheek, MD  aspirin 81 MG EC tablet Take 81 mg by mouth daily. 09/24/20   [provider]  CINNAMON PO Take 1,000 mg by mouth daily.    [provider]  glipiZIDE-metformin (METAGLIP) 5-500 MG tablet Take 1 tablet by mouth 2  (two) times daily. 09/24/20   [provider]  insulin NPH-regular Human (70-30) 100 UNIT/ML injection Inject 15 Units into the skin in the morning and at bedtime. 11/07/20 11/07/21  [provider]  isosorbide mononitrate (IMDUR) 30 MG 24 hr tablet Take 30 mg by mouth daily.    [provider]  losartan (COZAAR) 25 MG tablet Take 50 mg by mouth daily. 11/07/20   [provider]  metoprolol tartrate (LOPRESSOR) 25 MG tablet Take 12.5 mg by mouth 2 (two) times daily. 11/07/20   [provider]  simvastatin (ZOCOR) 40 MG tablet Take 40 mg by mouth at bedtime. 09/30/20   [provider]  tiZANidine (ZANAFLEX) 4 MG tablet Take 4 mg by mouth at bedtime as needed. 09/24/20   [provider]  Vitamin D, Ergocalciferol, (DRISDOL) 1.25 MG (50000 UNIT) CAPS capsule Take 1 capsule by mouth once a week. 09/25/20   [provider]     Allergies Patient has no known allergies.   Family History  Problem Relation Age of Onset   Diabetes Father     Social History Social History   Tobacco Use   Smoking status: Every Day    Packs/day: 0.00    Types: Cigars, Cigarettes   Smokeless tobacco: Never   Tobacco comments:    2-3 cigars/day  Substance Use Topics   Alcohol use: No   Drug use: No    Review of Systems  Constitutional:   No fever or chills.  ENT:   No sore throat. No rhinorrhea. Cardiovascular: Positive chest pain as above without syncope. Respiratory:   No dyspnea or cough. Gastrointestinal:   Negative for abdominal pain, vomiting and diarrhea.  Positive decreased appetite Musculoskeletal:   Negative for focal pain or swelling All other systems reviewed and are negative except as documented above in ROS and HPI.  ____________________________________________   PHYSICAL EXAM:  VITAL SIGNS: ED Triage Vitals  Enc Vitals Group     BP 04/03/21 0803 (!) 166/96     Pulse Rate 04/03/21 0803 89     Resp 04/03/21 0803 16      Temp 04/03/21 0803 97.8 F (36.6 C)     Temp Source 04/03/21 0803 Oral     SpO2 04/03/21 0803 96 %     Weight 04/03/21 0801 183 lb (83 kg)     Height 04/03/21 0801 5\' 6"  (1.676 m)     Head Circumference --      Peak Flow --      Pain Score 04/03/21 0801 7     Pain Loc --      Pain Edu? --      Excl. in GC? --     Vital signs reviewed, nursing assessments reviewed.   Constitutional:   Alert and oriented. Non-toxic appearance. Eyes:   Conjunctivae are normal. EOMI. PERRL. ENT      Head:   Normocephalic and atraumatic.      Nose:   Wearing a mask.      Mouth/Throat:   Wearing a mask.      Neck:   No meningismus. Full ROM. Hematological/Lymphatic/Immunilogical:   No cervical lymphadenopathy. Cardiovascular:   RRR. Symmetric bilateral radial and DP pulses.  No murmurs. Cap refill less than 2 seconds. Respiratory:   Normal respiratory effort without tachypnea/retractions. Breath sounds are clear and equal bilaterally. No wheezes/rales/rhonchi. Gastrointestinal:   Soft with left upper quadrant and epigastric tenderness.  Non distended. There is no CVA tenderness.  No rebound, rigidity, or guarding. Genitourinary:   deferred Musculoskeletal:   Normal range of motion in all extremities. No joint effusions.  No lower extremity tenderness.  No edema.  Chest wall nontender Neurologic:   Normal speech and language.  Motor grossly intact. No acute focal neurologic deficits are appreciated.  Skin:    Skin is warm, dry and intact. No rash noted.  No petechiae, purpura, or bullae.  ____________________________________________    LABS (pertinent positives/negatives) (all labs ordered are listed, but only abnormal results are displayed) Labs Reviewed  BASIC METABOLIC PANEL - Abnormal; Notable for the following components:      Result Value   Glucose, Bld 407 (*)    All other components within normal limits  HEPATIC FUNCTION PANEL - Abnormal; Notable for the following components:   Total  Protein 6.3 (*)    Albumin 3.3 (*)    Alkaline Phosphatase 144 (*)    All other components within normal limits  TROPONIN I (HIGH SENSITIVITY) - Abnormal; Notable for the following components:   Troponin I (High Sensitivity) 18 (*)    All other components within normal limits  TROPONIN I (HIGH SENSITIVITY) - Abnormal; Notable for the following components:   Troponin I (High Sensitivity) 19 (*)    All other components within normal limits  RESP PANEL BY RT-PCR (FLU A&B, COVID) ARPGX2  CBC  LIPASE, BLOOD   ____________________________________________   EKG  Interpreted by me Normal sinus rhythm rate  of 93, normal axis and intervals.  Poor R wave progression.  Normal ST segments, mild T wave inversions in 3 and aVF.  EKG is unchanged compared to previous EKG on Nov 20, 2020..  No acute ischemic changes.  ____________________________________________    RADIOLOGY  DG Chest 2 View  Result Date: 04/03/2021 CLINICAL DATA:  60 year old male with left chest pain intermittently for 2 days. EXAM: CHEST - 2 VIEW COMPARISON:  Chest radiographs 11/20/2020 and earlier. Cardiac CT 12/12/2020. FINDINGS: Stable somewhat low lung volumes with eventration of the diaphragm, normal variant. Cardiac and mediastinal contours are stable since 2016 and within normal limits. Visualized tracheal air column is within normal limits. No pneumothorax, pulmonary edema, pleural effusion or confluent pulmonary opacity. No acute osseous abnormality identified. Negative visible bowel gas pattern. IMPRESSION: No acute cardiopulmonary abnormality. Electronically Signed   By: Odessa Fleming M.D.   On: 04/03/2021 09:13    ____________________________________________   PROCEDURES Procedures  ____________________________________________  DIFFERENTIAL DIAGNOSIS   GERD/gastritis, peptic ulcer disease, pancreatitis, biliary disease, non-STEMI, pneumonia, viral illness.  CLINICAL IMPRESSION / ASSESSMENT AND PLAN / ED  COURSE  Medications ordered in the ED: Medications  alum & mag hydroxide-simeth (MAALOX/MYLANTA) 200-200-20 MG/5ML suspension 30 mL (30 mLs Oral Given 04/03/21 1002)  famotidine (PEPCID) tablet 40 mg (40 mg Oral Given 04/03/21 1002)  metoCLOPramide (REGLAN) injection 10 mg (10 mg Intravenous Given 04/03/21 1002)    Pertinent labs & imaging results that were available during my care of the patient were reviewed by me and considered in my medical decision making (see chart for details).  Chad Bliss Sr. was evaluated in Emergency Department on 04/03/2021 for the symptoms described in the history of present illness. He was evaluated in the context of the global COVID-19 pandemic, which necessitated consideration that the patient might be at risk for infection with the SARS-CoV-2 virus that causes COVID-19. Institutional protocols and algorithms that pertain to the evaluation of patients at risk for COVID-19 are in a state of rapid change based on information released by regulatory bodies including the CDC and federal and state organizations. These policies and algorithms were followed during the patient's care in the ED.   Patient presents with atypical chest pain, most likely gastric in origin.  Will check labs, give Maalox Pepcid Reglan.  Due to age and comorbidities, will get serial troponins for cardiac risk stratification.   ----------------------------------------- 11:05 AM on 04/03/2021 ----------------------------------------- Work-up completely unremarkable.  Troponins at baseline.  Feeling better after GI cocktail.  Will discharge to follow-up with PCP within the next week.     ____________________________________________   FINAL CLINICAL IMPRESSION(S) / ED DIAGNOSES    Final diagnoses:  Atypical chest pain  Acute gastritis without hemorrhage, unspecified gastritis type  Type 2 diabetes mellitus without complication, with long-term current use of insulin [facility])     ED  Discharge Orders          Ordered    metoCLOPramide (REGLAN) 10 MG tablet  Every 6 hours PRN        04/03/21 1105    famotidine (PEPCID) 20 MG tablet  2 times daily        04/03/21 1105    sucralfate (CARAFATE) 1 g tablet  4 times daily        04/03/21 1105            Portions of this note were generated with dragon dictation software. Dictation errors may occur despite best attempts at proofreading.  Sharman Cheek, MD 04/03/21 1105

## 2021-09-08 ENCOUNTER — Other Ambulatory Visit: Payer: Self-pay

## 2021-09-08 ENCOUNTER — Encounter: Payer: Self-pay | Admitting: *Deleted

## 2021-09-08 ENCOUNTER — Encounter: Payer: Self-pay | Attending: Internal Medicine | Admitting: *Deleted

## 2021-09-08 VITALS — BP 112/70 | Ht 66.0 in | Wt 182.1 lb

## 2021-09-08 DIAGNOSIS — Z713 Dietary counseling and surveillance: Secondary | ICD-10-CM | POA: Insufficient documentation

## 2021-09-08 DIAGNOSIS — E1140 Type 2 diabetes mellitus with diabetic neuropathy, unspecified: Secondary | ICD-10-CM

## 2021-09-08 DIAGNOSIS — E1165 Type 2 diabetes mellitus with hyperglycemia: Secondary | ICD-10-CM | POA: Insufficient documentation

## 2021-09-08 DIAGNOSIS — Z794 Long term (current) use of insulin: Secondary | ICD-10-CM

## 2021-09-08 NOTE — Progress Notes (Signed)
Diabetes Self-Management Education ? ?Visit Type: First/Initial ? ?Appt. Start Time: 1520 Appt. End Time: 1630 ? ?09/08/2021 ? ?[name], identified by name and date of birth, is a 61 y.o. male with a diagnosis of Diabetes: Type 2.  ? ?ASSESSMENT ? ?Blood pressure 112/70, height 5\' 6"  (1.676 m), weight 182 lb 1.6 oz (82.6 kg). ?Body mass index is 29.39 kg/m?. ? ? Diabetes Self-Management Education - 09/08/21 1900   ? ?  ? Visit Information  ? Visit Type First/Initial   ?  ? Initial Visit  ? Diabetes Type Type 2   ? Are you currently following a meal plan? No   ? Are you taking your medications as prescribed? No   He reports that he doesn't always take insulin 2 x day because he doesn't eat breakfast. He also is not currently taking BP, heart, cholesterol or neuropathy medications.  ? Date Diagnosed 2010   ?  ? Health Coping  ? How would you rate your overall health? Poor   ?  ? Psychosocial Assessment  ? Patient Belief/Attitude about Diabetes Other (comment)   "good and  bad days"  ? Self-care barriers None   ? Self-management support Doctor's office;Family   ? Patient Concerns Nutrition/Meal planning;Medication;Monitoring;Healthy Lifestyle;Problem Solving;Glycemic Control   ? Special Needs None   ? Preferred Learning Style Auditory;Visual;Hands on   ? Learning Readiness Contemplating   ? How often do you need to have someone help you when you read instructions, pamphlets, or other written materials from your doctor or pharmacy? 1 - Never   ? What is the last grade level you completed in school? 1 year [employer]   ?  ? Pre-Education Assessment  ? Patient understands the diabetes disease and treatment process. Needs Review   ? Patient understands incorporating nutritional management into lifestyle. Needs Instruction   ? Patient undertands incorporating physical activity into lifestyle. Needs Instruction   ? Patient understands using medications safely. Needs Instruction   ? Patient understands monitoring blood glucose,  interpreting and using results Needs Instruction   ? Patient understands prevention, detection, and treatment of acute complications. Needs Review   ? Patient understands prevention, detection, and treatment of chronic complications. Needs Review   ? Patient understands how to develop strategies to address psychosocial issues. Needs Instruction   ? Patient understands how to develop strategies to promote health/change behavior. Needs Instruction   ?  ? Complications  ? Last HgB A1C per patient/outside source 9 %   09/02/2021  ? How often do you check your blood sugar? 0 times/day (not testing)   Pt reports he can't feel his fingers and can't check his blood sugars. BG in the office today was 119 mg/dL at 09/04/2021 pm - 3 hrs pp.  ? Number of hypoglycemic episodes per month 2  ? Can you tell when your blood sugar is low?  Yes; He reports feeling nervous and sweaty but doesn't check his blood sugars.  ? What do you do if your blood sugar is low?  He had 2 glucose tablets  ? Have you had a dilated eye exam in the past 12 months? No   ? Have you had a dental exam in the past 12 months? No   ? Are you checking your feet? Yes   ? How many days per week are you checking your feet? 4   ?  ? Dietary Intake  ? Breakfast usually skips or eats out - eggs, cheese, hashbrown, chicken   ?  Snack (morning) reprots 1-2 snacks/day crackers with cheese or peanut butter, chips, sometimes fruit -orange, banana   ? Lunch hot dog and occasional fries, freid chicken sandwich at [employer]   ? Dinner fish, beef, pork, chicken, steak, rice and gravy, broccolli with cheese, cabbage, potatoes, peas, beans, corn, pasta, green beans, spinach, salads with lettuce, tomatoes and cuccumbers   ? Beverage(s) water, regular soda, sweet tea   ?  ? Exercise  ? Exercise Type ADL's   ?  ? Patient Education  ? Previous Diabetes Education Yes (please comment)   here last year 11/2020  ? Disease state  Explored patient's options for treatment of their diabetes   ?  Nutrition management  Role of diet in the treatment of diabetes and the relationship between the three main macronutrients and blood glucose level;Food label reading, portion sizes and measuring food.;Reviewed blood glucose goals for pre and post meals and how to evaluate the patients' food intake on their blood glucose level.;Meal timing in regards to the patients' current diabetes medication.;Information on hints to eating out and maintain blood glucose control.   ? Physical activity and exercise  Role of exercise on diabetes management, blood pressure control and cardiac health.   ? Medications Taught/reviewed insulin injection, site rotation, insulin storage and needle disposal.;Reviewed patients medication for diabetes, action, purpose, timing of dose and side effects.;Other (comment)   take medications as prescribed by MD  ? Monitoring Identified appropriate SMBG and/or A1C goals.;Yearly dilated eye exam;Other (comment)   Check with insurance about coverage for [employer] or Dexcom.  ? Acute complications Taught treatment of hypoglycemia - the 15 rule.   ? Chronic complications Relationship between chronic complications and blood glucose control   ? Psychosocial adjustment Identified and addressed patients feelings and concerns about diabetes   ?  ? Individualized Goals (developed by patient)  ? Reducing Risk Other (comment)   improve blood sugars, decrease medications, prevent diabetes complications, lead a healthier lifestyle, quit smoking, become more fit  ?  ? Outcomes  ? Expected Outcomes Demonstrated limited interest in learning.  Expect minimal changes   ? Program Status Not Completed   ? ?  ?  ?Individualized Plan for Diabetes Self-Management Training:  ? ?Learning Objective:  Patient will have a greater understanding of diabetes self-management. ?Patient education plan is to attend individual and/or group sessions per assessed needs and concerns. ?  ?Plan:  ? ?Patient Instructions  ?Check blood  sugars 2 x day before breakfast and before supper every day ?Bring blood sugar records to the next appointment ? ?Exercise: Walk as tolerated ? ?Eat 3 meals day,   1-2  snacks a day ?Space meals 4-6 hours apart ?Gordan't skip meals - eat at least 1 protein and 1 carbohydrate serving ?Limit fried foods and foods high in fat ?Avoid sugar sweetened drinks (soda, tea)  ? ?Make an eye doctor appointment ? ?Carry fast acting glucose and a snack at all times ?Rotate injection sites ? ?Call back if you want to schedule another appointment ? ?Expected Outcomes:  Demonstrated limited interest in learning.  Expect minimal changes ? ?Education material provided:  ?Advertising account executive Guidelines ?Simple Meal Plan ?Symptoms, causes and treatments of Hypoglycemia ?Quick and Balanced Meals ? ?If problems or questions, patient to contact team via:   ?Sharion Settler, RN, CCM, CDCES [phone] ? ?Future DSME appointment:  ?The patient doesn't want to return for further education at this time. He was seen last May and didn't return for his  scheduled appointment in June.   ?

## 2021-09-08 NOTE — Patient Instructions (Signed)
Check blood sugars 2 x day before breakfast and before supper every day ?Bring blood sugar records to the next appointment ? ?Exercise: Walk as tolerated ? ?Eat 3 meals day,   1-2  snacks a day ?Space meals 4-6 hours apart ?Eaden't skip meals - eat at least 1 protein and 1 carbohydrate serving ?Limit fried foods and foods high in fat ?Avoid sugar sweetened drinks (soda, tea)  ? ?Make an eye doctor appointment ? ?Carry fast acting glucose and a snack at all times ?Rotate injection sites ? ?Call back if you want to schedule another appointment ?

## 2022-01-05 NOTE — Addendum Note (Signed)
Encounter addended by: Novella Olive on: 01/05/2022 5:37 PM  Actions taken: Letter saved

## 2022-05-04 ENCOUNTER — Encounter (INDEPENDENT_AMBULATORY_CARE_PROVIDER_SITE_OTHER): Payer: Self-pay

## 2022-09-20 ENCOUNTER — Other Ambulatory Visit: Payer: Self-pay

## 2022-09-20 ENCOUNTER — Emergency Department: Payer: BLUE CROSS/BLUE SHIELD

## 2022-09-20 ENCOUNTER — Inpatient Hospital Stay
Admission: EM | Admit: 2022-09-20 | Discharge: 2022-09-23 | DRG: 291 | Disposition: A | Discharge status: Home / Self Care | Payer: BLUE CROSS/BLUE SHIELD | Attending: Student | Admitting: Student

## 2022-09-20 DIAGNOSIS — R339 Retention of urine, unspecified: Secondary | ICD-10-CM | POA: Diagnosis present

## 2022-09-20 DIAGNOSIS — E559 Vitamin D deficiency, unspecified: Secondary | ICD-10-CM | POA: Diagnosis present

## 2022-09-20 DIAGNOSIS — I700 Atherosclerosis of aorta: Secondary | ICD-10-CM | POA: Diagnosis present

## 2022-09-20 DIAGNOSIS — E1140 Type 2 diabetes mellitus with diabetic neuropathy, unspecified: Secondary | ICD-10-CM | POA: Diagnosis present

## 2022-09-20 DIAGNOSIS — S92422A Displaced fracture of distal phalanx of left great toe, initial encounter for closed fracture: Secondary | ICD-10-CM | POA: Insufficient documentation

## 2022-09-20 DIAGNOSIS — Z833 Family history of diabetes mellitus: Secondary | ICD-10-CM

## 2022-09-20 DIAGNOSIS — D649 Anemia, unspecified: Secondary | ICD-10-CM | POA: Diagnosis present

## 2022-09-20 DIAGNOSIS — R601 Generalized edema: Secondary | ICD-10-CM

## 2022-09-20 DIAGNOSIS — Z794 Long term (current) use of insulin: Secondary | ICD-10-CM

## 2022-09-20 DIAGNOSIS — I5033 Acute on chronic diastolic (congestive) heart failure: Secondary | ICD-10-CM

## 2022-09-20 DIAGNOSIS — I2510 Atherosclerotic heart disease of native coronary artery without angina pectoris: Secondary | ICD-10-CM | POA: Diagnosis present

## 2022-09-20 DIAGNOSIS — E119 Type 2 diabetes mellitus without complications: Secondary | ICD-10-CM

## 2022-09-20 DIAGNOSIS — S92425A Nondisplaced fracture of distal phalanx of left great toe, initial encounter for closed fracture: Secondary | ICD-10-CM

## 2022-09-20 DIAGNOSIS — T502X5A Adverse effect of carbonic-anhydrase inhibitors, benzothiadiazides and other diuretics, initial encounter: Secondary | ICD-10-CM | POA: Diagnosis not present

## 2022-09-20 DIAGNOSIS — N5089 Other specified disorders of the male genital organs: Secondary | ICD-10-CM | POA: Diagnosis present

## 2022-09-20 DIAGNOSIS — E876 Hypokalemia: Secondary | ICD-10-CM | POA: Diagnosis not present

## 2022-09-20 DIAGNOSIS — Z7982 Long term (current) use of aspirin: Secondary | ICD-10-CM

## 2022-09-20 DIAGNOSIS — F17210 Nicotine dependence, cigarettes, uncomplicated: Secondary | ICD-10-CM | POA: Diagnosis present

## 2022-09-20 DIAGNOSIS — J9601 Acute respiratory failure with hypoxia: Principal | ICD-10-CM

## 2022-09-20 DIAGNOSIS — I161 Hypertensive emergency: Secondary | ICD-10-CM | POA: Diagnosis present

## 2022-09-20 DIAGNOSIS — Z79899 Other long term (current) drug therapy: Secondary | ICD-10-CM

## 2022-09-20 DIAGNOSIS — I493 Ventricular premature depolarization: Secondary | ICD-10-CM | POA: Diagnosis present

## 2022-09-20 DIAGNOSIS — F17290 Nicotine dependence, other tobacco product, uncomplicated: Secondary | ICD-10-CM | POA: Diagnosis present

## 2022-09-20 DIAGNOSIS — J90 Pleural effusion, not elsewhere classified: Secondary | ICD-10-CM

## 2022-09-20 DIAGNOSIS — I2489 Other forms of acute ischemic heart disease: Secondary | ICD-10-CM | POA: Diagnosis present

## 2022-09-20 DIAGNOSIS — E7800 Pure hypercholesterolemia, unspecified: Secondary | ICD-10-CM | POA: Diagnosis present

## 2022-09-20 DIAGNOSIS — T465X6A Underdosing of other antihypertensive drugs, initial encounter: Secondary | ICD-10-CM | POA: Diagnosis present

## 2022-09-20 DIAGNOSIS — S90422A Blister (nonthermal), left great toe, initial encounter: Secondary | ICD-10-CM | POA: Diagnosis present

## 2022-09-20 DIAGNOSIS — X58XXXA Exposure to other specified factors, initial encounter: Secondary | ICD-10-CM | POA: Diagnosis present

## 2022-09-20 DIAGNOSIS — Z91148 Patient's other noncompliance with medication regimen for other reason: Secondary | ICD-10-CM

## 2022-09-20 DIAGNOSIS — I110 Hypertensive heart disease with heart failure: Secondary | ICD-10-CM | POA: Diagnosis present

## 2022-09-20 DIAGNOSIS — R7989 Other specified abnormal findings of blood chemistry: Secondary | ICD-10-CM | POA: Diagnosis present

## 2022-09-20 DIAGNOSIS — Z8711 Personal history of peptic ulcer disease: Secondary | ICD-10-CM

## 2022-09-20 DIAGNOSIS — I4891 Unspecified atrial fibrillation: Secondary | ICD-10-CM | POA: Diagnosis present

## 2022-09-20 DIAGNOSIS — Z7984 Long term (current) use of oral hypoglycemic drugs: Secondary | ICD-10-CM

## 2022-09-20 DIAGNOSIS — E11649 Type 2 diabetes mellitus with hypoglycemia without coma: Secondary | ICD-10-CM | POA: Diagnosis not present

## 2022-09-20 LAB — URINALYSIS, ROUTINE W REFLEX MICROSCOPIC
Bacteria, UA: NONE SEEN
Bilirubin Urine: NEGATIVE
Glucose, UA: NEGATIVE mg/dL
Hgb urine dipstick: NEGATIVE
Ketones, ur: NEGATIVE mg/dL
Nitrite: NEGATIVE
Protein, ur: >=300 mg/dL — AB
Specific Gravity, Urine: 1.018 (ref 1.005–1.030)
pH: 7 (ref 5.0–8.0)

## 2022-09-20 LAB — CHLAMYDIA/NGC RT PCR (ARMC ONLY)
Chlamydia Tr: NOT DETECTED
N gonorrhoeae: NOT DETECTED

## 2022-09-20 LAB — BASIC METABOLIC PANEL
Anion gap: 4 — ABNORMAL LOW (ref 5–15)
BUN: 17 mg/dL (ref 8–23)
CO2: 27 mmol/L (ref 22–32)
Calcium: 8 mg/dL — ABNORMAL LOW (ref 8.9–10.3)
Chloride: 103 mmol/L (ref 98–111)
Creatinine, Ser: 1.2 mg/dL (ref 0.61–1.24)
GFR, Estimated: >60 mL/min (ref >60)
Glucose, Bld: 168 mg/dL — ABNORMAL HIGH (ref 70–99)
Potassium: 3.6 mmol/L (ref 3.5–5.1)
Sodium: 134 mmol/L — ABNORMAL LOW (ref 135–145)

## 2022-09-20 LAB — RETICULOCYTES
Immature Retic Fract: 11.6 % (ref 2.3–15.9)
RBC.: 4.35 MIL/uL (ref 4.22–5.81)
Retic Count, Absolute: 98.7 10*3/uL (ref 19.0–186.0)
Retic Ct Pct: 2.3 % (ref 0.4–3.1)

## 2022-09-20 LAB — GLUCOSE, CAPILLARY
Glucose-Capillary: 130 mg/dL — ABNORMAL HIGH (ref 70–99)
Glucose-Capillary: 55 mg/dL — ABNORMAL LOW (ref 70–99)
Glucose-Capillary: 63 mg/dL — ABNORMAL LOW (ref 70–99)
Glucose-Capillary: 86 mg/dL (ref 70–99)
Glucose-Capillary: 133 mg/dL — ABNORMAL HIGH (ref 70–99)
Glucose-Capillary: 197 mg/dL — ABNORMAL HIGH (ref 70–99)

## 2022-09-20 LAB — BRAIN NATRIURETIC PEPTIDE: B Natriuretic Peptide: 1778.7 pg/mL — ABNORMAL HIGH (ref 0.0–100.0)

## 2022-09-20 LAB — CBC
HCT: 38.3 % — ABNORMAL LOW (ref 39.0–52.0)
Hemoglobin: 12.7 g/dL — ABNORMAL LOW (ref 13.0–17.0)
MCH: 31.3 pg (ref 26.0–34.0)
MCHC: 33.2 g/dL (ref 30.0–36.0)
MCV: 94.3 fL (ref 80.0–100.0)
Platelets: 213 10*3/uL (ref 150–400)
RBC: 4.06 MIL/uL — ABNORMAL LOW (ref 4.22–5.81)
RDW: 13.7 % (ref 11.5–15.5)
WBC: 7.5 10*3/uL (ref 4.0–10.5)
nRBC: 0 % (ref 0.0–0.2)

## 2022-09-20 LAB — TROPONIN I (HIGH SENSITIVITY)
Troponin I (High Sensitivity): 37 ng/L — ABNORMAL HIGH
Troponin I (High Sensitivity): 39 ng/L — ABNORMAL HIGH (ref <18)

## 2022-09-20 LAB — FERRITIN: Ferritin: 128 ng/mL (ref 24–336)

## 2022-09-20 LAB — IRON AND TIBC
Iron: 48 ug/dL (ref 45–182)
Saturation Ratios: 14 % — ABNORMAL LOW (ref 17.9–39.5)
TIBC: 333 ug/dL (ref 250–450)
UIBC: 285 ug/dL

## 2022-09-20 LAB — VITAMIN B12: Vitamin B-12: 425 pg/mL (ref 180–914)

## 2022-09-20 LAB — MAGNESIUM: Magnesium: 1.9 mg/dL (ref 1.7–2.4)

## 2022-09-20 LAB — FOLATE: Folate: 12.1 ng/mL (ref >5.9)

## 2022-09-20 LAB — PHOSPHORUS: Phosphorus: 3.3 mg/dL (ref 2.5–4.6)

## 2022-09-20 LAB — HIV ANTIBODY (ROUTINE TESTING W REFLEX): HIV Screen 4th Generation wRfx: NONREACTIVE

## 2022-09-20 MED ORDER — ACETAMINOPHEN 325 MG PO TABS
650.0000 mg | ORAL_TABLET | Freq: Four times a day (QID) | ORAL | Status: DC | PRN
  Administered 2022-09-22 – 2022-09-23 (×2): 650 mg via ORAL
  Filled 2022-09-20 (×2): qty 2

## 2022-09-20 MED ORDER — ENOXAPARIN SODIUM 40 MG/0.4ML IJ SOSY
40.0000 mg | PREFILLED_SYRINGE | INTRAMUSCULAR | Status: DC
  Administered 2022-09-20 – 2022-09-23 (×4): 40 mg via SUBCUTANEOUS
  Filled 2022-09-20 (×4): qty 0.4

## 2022-09-20 MED ORDER — SPIRONOLACTONE 12.5 MG HALF TABLET
12.5000 mg | ORAL_TABLET | Freq: Every day | ORAL | Status: DC
  Administered 2022-09-21: 12.5 mg via ORAL
  Filled 2022-09-20: qty 1

## 2022-09-20 MED ORDER — NITROGLYCERIN 2 % TD OINT
1.0000 [in_us] | TOPICAL_OINTMENT | Freq: Once | TRANSDERMAL | Status: AC
  Administered 2022-09-20: 1 [in_us] via TOPICAL
  Filled 2022-09-20: qty 1

## 2022-09-20 MED ORDER — ONDANSETRON HCL 4 MG/2ML IJ SOLN: 4.0000 mg | Freq: Four times a day (QID) | INTRAMUSCULAR | Status: DC | PRN

## 2022-09-20 MED ORDER — HYDRALAZINE HCL 20 MG/ML IJ SOLN
10.0000 mg | Freq: Four times a day (QID) | INTRAMUSCULAR | Status: DC | PRN
  Administered 2022-09-21: 10 mg via INTRAVENOUS
  Filled 2022-09-20: qty 1

## 2022-09-20 MED ORDER — INSULIN ASPART 100 UNIT/ML IJ SOLN
0.0000 [IU] | Freq: Three times a day (TID) | INTRAMUSCULAR | Status: DC
  Administered 2022-09-20 – 2022-09-21 (×3): 2 [IU] via SUBCUTANEOUS
  Filled 2022-09-20 (×3): qty 1

## 2022-09-20 MED ORDER — HYDRALAZINE HCL 50 MG PO TABS
50.0000 mg | ORAL_TABLET | Freq: Four times a day (QID) | ORAL | Status: DC | PRN
  Administered 2022-09-20 – 2022-09-22 (×4): 50 mg via ORAL
  Filled 2022-09-20 (×4): qty 1

## 2022-09-20 MED ORDER — TAMSULOSIN HCL 0.4 MG PO CAPS
0.4000 mg | ORAL_CAPSULE | Freq: Every day | ORAL | Status: DC
  Administered 2022-09-20 – 2022-09-22 (×3): 0.4 mg via ORAL
  Filled 2022-09-20 (×3): qty 1

## 2022-09-20 MED ORDER — METOPROLOL TARTRATE 5 MG/5ML IV SOLN: 5.0000 mg | Freq: Once | INTRAVENOUS | Status: DC

## 2022-09-20 MED ORDER — METOPROLOL TARTRATE 5 MG/5ML IV SOLN
5.0000 mg | Freq: Once | INTRAVENOUS | Status: AC
  Administered 2022-09-20: 5 mg via INTRAVENOUS
  Filled 2022-09-20: qty 5

## 2022-09-20 MED ORDER — ASPIRIN 81 MG PO TBEC
81.0000 mg | DELAYED_RELEASE_TABLET | Freq: Every day | ORAL | Status: DC
  Administered 2022-09-20 – 2022-09-23 (×4): 81 mg via ORAL
  Filled 2022-09-20 (×4): qty 1

## 2022-09-20 MED ORDER — INSULIN ASPART PROT & ASPART (70-30 MIX) 100 UNIT/ML ~~LOC~~ SUSP
15.0000 [IU] | Freq: Two times a day (BID) | SUBCUTANEOUS | Status: DC
  Administered 2022-09-20: 15 [IU] via SUBCUTANEOUS
  Filled 2022-09-20: qty 10

## 2022-09-20 MED ORDER — METOPROLOL SUCCINATE ER 25 MG PO TB24
25.0000 mg | ORAL_TABLET | Freq: Every day | ORAL | Status: DC
  Administered 2022-09-20 – 2022-09-21 (×2): 25 mg via ORAL
  Filled 2022-09-20 (×2): qty 1

## 2022-09-20 MED ORDER — ONDANSETRON HCL 4 MG PO TABS: 4.0000 mg | ORAL_TABLET | Freq: Four times a day (QID) | ORAL | Status: DC | PRN

## 2022-09-20 MED ORDER — LOSARTAN POTASSIUM 50 MG PO TABS
50.0000 mg | ORAL_TABLET | Freq: Every day | ORAL | Status: DC
  Administered 2022-09-20 – 2022-09-23 (×4): 50 mg via ORAL
  Filled 2022-09-20 (×4): qty 1

## 2022-09-20 MED ORDER — ISOSORBIDE MONONITRATE ER 30 MG PO TB24
30.0000 mg | ORAL_TABLET | Freq: Every day | ORAL | Status: DC
  Administered 2022-09-20 – 2022-09-23 (×4): 30 mg via ORAL
  Filled 2022-09-20 (×4): qty 1

## 2022-09-20 MED ORDER — INSULIN ASPART PROT & ASPART (70-30 MIX) 100 UNIT/ML ~~LOC~~ SUSP
15.0000 [IU] | Freq: Two times a day (BID) | SUBCUTANEOUS | Status: DC
  Administered 2022-09-21 (×2): 15 [IU] via SUBCUTANEOUS
  Filled 2022-09-20: qty 10

## 2022-09-20 MED ORDER — INSULIN DETEMIR 100 UNIT/ML ~~LOC~~ SOLN
15.0000 [IU] | Freq: Every day | SUBCUTANEOUS | Status: DC
  Administered 2022-09-20: 15 [IU] via SUBCUTANEOUS
  Filled 2022-09-20: qty 0.15

## 2022-09-20 MED ORDER — FUROSEMIDE 10 MG/ML IJ SOLN
4.0000 mg/h | INTRAVENOUS | Status: DC
  Administered 2022-09-20: 8 mg/h via INTRAVENOUS
  Filled 2022-09-20 (×2): qty 20

## 2022-09-20 MED ORDER — ACETAMINOPHEN 650 MG RE SUPP: 650.0000 mg | Freq: Four times a day (QID) | RECTAL | Status: DC | PRN

## 2022-09-20 MED ORDER — FUROSEMIDE 10 MG/ML IJ SOLN
80.0000 mg | Freq: Once | INTRAMUSCULAR | Status: AC
  Administered 2022-09-20: 80 mg via INTRAVENOUS
  Filled 2022-09-20: qty 8

## 2022-09-20 MED ORDER — ISOSORBIDE MONONITRATE ER 60 MG PO TB24: 30.0000 mg | ORAL_TABLET | Freq: Every day | ORAL | Status: DC

## 2022-09-20 MED ORDER — INSULIN ASPART 100 UNIT/ML IJ SOLN: 0.0000 [IU] | Freq: Every day | INTRAMUSCULAR | Status: DC

## 2022-09-20 MED ORDER — GLIPIZIDE ER 5 MG PO TB24
5.0000 mg | ORAL_TABLET | Freq: Every day | ORAL | Status: DC
  Administered 2022-09-20: 5 mg via ORAL
  Filled 2022-09-20 (×2): qty 1

## 2022-09-20 MED ORDER — FUROSEMIDE 10 MG/ML IJ SOLN
40.0000 mg | Freq: Two times a day (BID) | INTRAMUSCULAR | Status: DC
  Administered 2022-09-20: 40 mg via INTRAVENOUS
  Filled 2022-09-20: qty 4

## 2022-09-20 MED ORDER — ATORVASTATIN CALCIUM 20 MG PO TABS
40.0000 mg | ORAL_TABLET | Freq: Every day | ORAL | Status: DC
  Administered 2022-09-20 – 2022-09-23 (×4): 40 mg via ORAL
  Filled 2022-09-20 (×4): qty 2

## 2022-09-20 MED ORDER — METOPROLOL SUCCINATE ER 25 MG PO TB24: 25.0000 mg | ORAL_TABLET | Freq: Every day | ORAL | Status: DC

## 2022-09-20 MED ORDER — LOSARTAN POTASSIUM 50 MG PO TABS: 50.0000 mg | ORAL_TABLET | Freq: Every day | ORAL | Status: DC

## 2022-09-20 NOTE — Assessment & Plan Note (Signed)
Hemoglobin 12.7, no recent baseline Anemia panel

## 2022-09-20 NOTE — Assessment & Plan Note (Signed)
Anasarca Scrotal ultrasound showing no testicular abnormality Expecting improvement with diuretic regimen as described above under CHF

## 2022-09-20 NOTE — H&P (Signed)
History and Physical    Patient: Chad Good DOB: 10-Dec-1960 DOA: 09/20/2022 DOS: the patient was seen and examined on 09/20/2022 PCP: Gladstone Lighter, MD  Patient coming from: Home  Chief Complaint:  Chief Complaint  Patient presents with   Chest Pain   Groin Swelling    HPI: Chad Holen Sr. is a 62 y.o. 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  He denies chest pain or shortness of breath.  He denies trauma to his toe.  On arrival he was noted to be tachypneic with O2 sat 84% on room air and blood pressure 202/123.  He reported not taking any of his blood pressure medication in several months and using his insulin and frequently.  He was seen by nutritionist on 3/6 when he reported not checking his blood sugars. ED course and data review: As above, BP 202/123, tachypneic to 26 with O2 sat 84% on room air.  Troponin 39 and BNP 1778.  Glucose 168, hemoglobin 12.7. EKG, personally viewed and interpreted shows sinus at 90 with PVCs and nonspecific ST T wave changes. Chest x-ray showed bilateral pleural effusions right greater than left Scrotal ultrasound showed scrotal wall thickening without testicular abnormality X-ray left great toe showed a nondisplaced fracture of the distal phalanx of the left great toe Patient treated with Lasix 80 mg IV and Nitropaste.  Hospitalist consulted for admission.   Review of Systems: As mentioned in the history of present illness. All other systems reviewed and are negative.  Past Medical History:  Diagnosis Date   Diabetes mellitus without complication (HCC)    High cholesterol     Hypertension    Stomach ulcer    Past Surgical History:  Procedure Laterality Date   COLONOSCOPY     FEMUR CLOSED REDUCTION     Social History:  reports that he has been smoking cigars and cigarettes. He started smoking about 27 years ago. He has never used smokeless tobacco. He reports that he does not drink alcohol and does not use drugs.  No Known Allergies  Family History  Problem Relation Age of Onset   Diabetes Father     Prior to Admission medications   Medication Sig Start Date End Date Taking? Authorizing Provider  aspirin 81 MG EC tablet Take 81 mg by mouth daily. 09/24/20   [provider]  glipiZIDE-metformin (METAGLIP) 5-500 MG tablet Take 1 tablet by mouth 2 (two) times daily. 09/24/20   [provider]  insulin NPH-regular Human (70-30) 100 UNIT/ML injection Inject 15-20 Units into the skin 2 (two) times daily with a meal. 11/07/20 11/07/21  [provider]  isosorbide mononitrate (IMDUR) 30 MG 24 hr tablet Take 30 mg by mouth daily. Patient not taking: Reported on 09/08/2021    [provider]  losartan (COZAAR) 25 MG tablet Take 50 mg by mouth daily. Patient not taking: Reported on 09/08/2021 11/07/20   [provider]  metoprolol tartrate (LOPRESSOR) 25 MG tablet Take 12.5 mg by mouth 2 (two) times daily. Patient not taking: Reported on 09/08/2021 11/07/20   [provider]  pregabalin (LYRICA) 25 MG capsule Take 25 mg by  mouth in the morning and at bedtime. Patient not taking: Reported on 09/08/2021 09/02/21 09/02/22  [provider]  simvastatin (ZOCOR) 40 MG tablet Take 40 mg by mouth at bedtime. Patient not taking: Reported on 09/08/2021 09/30/20   [provider]  Vitamin D, Ergocalciferol, (DRISDOL) 1.25 MG (50000 UNIT) CAPS capsule Take 1 capsule by mouth once a week. Patient not taking: Reported on 09/08/2021 09/25/20   [provider]    Physical Exam: Vitals:   09/20/22 0304 09/20/22 0306 09/20/22  0330 09/20/22 0400  BP: (!) 202/123  (!) 193/121 (!) 196/126  Pulse: 96  94 93  Resp: 19 (!) 26 (!) 26   Temp:      TempSrc:      SpO2: (!) 84% 97% 96% 97%  Weight:      Height:       Physical Exam Vitals and nursing note reviewed.  Constitutional:      Interventions: Nasal cannula in place.  HENT:     Head: Normocephalic and atraumatic.  Cardiovascular:     Rate and Rhythm: Normal rate and regular rhythm.     Heart sounds: Normal heart sounds.  Pulmonary:     Effort: Pulmonary effort is normal. Tachypnea present.     Breath sounds: Examination of the right-lower field reveals decreased breath sounds. Examination of the left-lower field reveals decreased breath sounds. Decreased breath sounds present.  Abdominal:     Palpations: Abdomen is soft.     Tenderness: There is no abdominal tenderness.  Musculoskeletal:     Right lower leg: Edema present.     Left lower leg: Edema present.  Neurological:     Mental Status: Mental status is at baseline.     Labs on Admission: I have personally reviewed following labs and imaging studies  CBC: Recent Labs  Lab 09/20/22 0109  WBC 7.5  HGB 12.7*  HCT 38.3*  MCV 94.3  PLT 123456   Basic Metabolic Panel: Recent Labs  Lab 09/20/22 0109  NA 134*  K 3.6  CL 103  CO2 27  GLUCOSE 168*  BUN 17  CREATININE 1.20  CALCIUM 8.0*   GFR: Estimated Creatinine Clearance: 65.7 mL/min (by C-G formula based on SCr of 1.2 mg/dL). Liver Function Tests: No results for input(s): "AST", "ALT", "ALKPHOS", "BILITOT", "PROT", "ALBUMIN" in the last 168 hours. No results for input(s): "LIPASE", "AMYLASE" in the last 168 hours. No results for input(s): "AMMONIA" in the last 168 hours. Coagulation Profile: No results for input(s): "INR", "PROTIME" in the last 168 hours. Cardiac Enzymes: No results for input(s): "CKTOTAL", "CKMB", "CKMBINDEX", "TROPONINI" in the last 168 hours. BNP (last 3 results) No results for input(s): "PROBNP" in the last  8760 hours. HbA1C: No results for input(s): "HGBA1C" in the last 72 hours. CBG: No results for input(s): "GLUCAP" in the last 168 hours. Lipid Profile: No results for input(s): "CHOL", "HDL", "LDLCALC", "TRIG", "CHOLHDL", "LDLDIRECT" in the last 72 hours. Thyroid Function Tests: No results for input(s): "TSH", "T4TOTAL", "FREET4", "T3FREE", "THYROIDAB" in the last 72 hours. Anemia Panel: No results for input(s): "VITAMINB12", "FOLATE", "FERRITIN", "TIBC", "IRON", "RETICCTPCT" in the last 72 hours. Urine analysis:    Component Value Date/Time   COLORURINE AMBER (A) 09/20/2022 0216   APPEARANCEUR CLEAR (A) 09/20/2022 0216   APPEARANCEUR Clear 05/08/2014 0846   LABSPEC 1.018 09/20/2022 0216   LABSPEC 1.035 05/08/2014 0846   PHURINE 7.0 09/20/2022 0216   GLUCOSEU NEGATIVE 09/20/2022 0216   GLUCOSEU >=500 05/08/2014 0846  HGBUR NEGATIVE 09/20/2022 0216   BILIRUBINUR NEGATIVE 09/20/2022 0216   BILIRUBINUR Negative 05/08/2014 0846   KETONESUR NEGATIVE 09/20/2022 0216   PROTEINUR >=300 (A) 09/20/2022 0216   NITRITE NEGATIVE 09/20/2022 0216   LEUKOCYTESUR TRACE (A) 09/20/2022 0216   LEUKOCYTESUR Negative 05/08/2014 0846    Radiological Exams on Admission: US SCROTUM W/DOPPLER  Result Date: 09/20/2022 CLINICAL DATA:  Scrotal swell EXAM: SCROTAL ULTRASOUND DOPPLER ULTRASOUND OF THE TESTICLES TECHNIQUE: Complete ultrasound examination of the testicles, epididymis, and other scrotal structures was performed. Color and spectral Doppler ultrasound were also utilized to evaluate blood flow to the testicles. COMPARISON:  None Available. FINDINGS: Right testicle Measurements: 4.2 x 2.9 x 2.7 cm. No mass or microlithiasis visualized. Left testicle Measurements: 4.1 x 2.9 x 2.5 cm. No mass or microlithiasis visualized. Right epididymis:  Normal in size and appearance. Left epididymis:  Normal in size and appearance. Hydrocele:  None visualized. Varicocele:  None visualized. Pulsed Doppler  interrogation of both testes demonstrates normal low resistance arterial and venous waveforms bilaterally. Other: Marked bilateral scrotal wall thickening, 4.9 cm on the right and 3.9 cm on the left. IMPRESSION: Scrotal wall thickening bilaterally. No testicular abnormality. Electronically Signed   By: Rolm Baptise M.D.   On: 09/20/2022 02:24   DG Toe Great Left  Result Date: 09/20/2022 CLINICAL DATA:  Left great toe hematoma. EXAM: LEFT GREAT TOE COMPARISON:  None Available. FINDINGS: A nondisplaced fracture deformity is seen involving the medial aspect of the base of the distal phalanx of the left great toe. There is no evidence of dislocation. There is no evidence of arthropathy or other focal bone abnormality. There is mild diffuse soft tissue swelling. IMPRESSION: Nondisplaced fracture of the distal phalanx of the left great toe. Electronically Signed   By: Virgina Norfolk M.D.   On: 09/20/2022 01:40   DG Chest 2 View  Result Date: 09/20/2022 CLINICAL DATA:  Chest pain and shortness of breath. EXAM: CHEST - 2 VIEW COMPARISON:  April 03, 2021 FINDINGS: The heart size and mediastinal contours are within normal limits. Low lung volumes are seen. Marked severity airspace disease is noted within the right lung base with mild to moderate severity linear atelectasis seen within the mid and lower left lung. There is a small to moderate sized right-sided pleural effusion. A very small left pleural effusion is also suspected. No pneumothorax is identified. The visualized skeletal structures are unremarkable. IMPRESSION: 1. Low lung volumes with marked severity right basilar airspace disease. 2. Mild to moderate severity linear atelectasis within the mid and lower left lung. 3. Bilateral pleural effusions, right greater than left. Electronically Signed   By: Virgina Norfolk M.D.   On: 09/20/2022 01:38     Data Reviewed: Relevant notes from primary care and specialist visits, past discharge summaries as  available in EHR, including Care Everywhere. Prior diagnostic testing as pertinent to current admission diagnoses Updated medications and problem lists for reconciliation ED course, including vitals, labs, imaging, treatment and response to treatment Triage notes, nursing and pharmacy notes and ED provider's notes Notable results as noted in HPI   Assessment and Plan: Acute on chronic diastolic CHF (congestive heart failure) (HCC) Hypertensive emergency Bilateral pleural effusions Acute respiratory failure with hypoxia Medication nonadherence Patient presents with scrotal edema, bilateral pleural effusion on chest x-ray, BNP over 1700 Echo from 2022 showing grade 1 diastolic dysfunction IV Lasix Restart metoprolol, losartan, Imdur Supplemental oxygen to keep sats over 94% Daily weights with intake and output monitoring Echocardiogram  Elevated troponin Nonobstructive CAD Patient denies chest pain, EKG nonacute Had an abnormal nuclear stress test in 2022 and a coronary calcium score of 9 Elevated troponin of 39 likely secondary to demand ischemia Follow-up echocardiogram to evaluate for wall motion abnormality Resume metoprolol, simvastatin, losartan and aspirin   Scrotal edema Anasarca Scrotal ultrasound showing no testicular abnormality Expecting improvement with diuretic regimen as described above under CHF  Diabetes mellitus, type II (HCC) Basal insulin with sliding scale coverage Patient was recently seen by a nutritionist Follow A1c  Anemia Hemoglobin 12.7, no recent baseline Anemia panel   Fracture of distal phalanx of left great toe Podiatry consult Pain control        DVT prophylaxis: Lovenox  Consults: Dr. Clayborn Bigness cardiology  Advance Care Planning:   Code Status: Prior   Family Communication: none  Disposition Plan: Back to previous home environment  Severity of Illness: The appropriate patient status for this patient is INPATIENT.  Inpatient status is judged to be reasonable and necessary in order to provide the required intensity of service to ensure the patient's safety. The patient's presenting symptoms, physical exam findings, and initial radiographic and laboratory data in the context of their chronic comorbidities is felt to place them at high risk for further clinical deterioration. Furthermore, it is not anticipated that the patient will be medically stable for discharge from the hospital within 2 midnights of admission.   * I certify that at the point of admission it is my clinical judgment that the patient will require inpatient hospital care spanning beyond 2 midnights from the point of admission due to high intensity of service, high risk for further deterioration and high frequency of surveillance required.*  Author: Athena Masse, MD 09/20/2022 4:25 AM  For on call review [email or web address].

## 2022-09-20 NOTE — ED Notes (Signed)
ED Provider at bedside. 

## 2022-09-20 NOTE — ED Triage Notes (Signed)
Pt presents to ER with chest pain, shortness of breath for the past week. Pt reports he helped his niece move to [place], reports driving all day yesterday and flying back today. Pt reports swelling ot scrotum area since yesterday. Pt also reports a spot on his great toe. Pt has a hematoma to great toe, pt does not recall any injury to area. Pt talks in complete sentences no respiratory distress noted

## 2022-09-20 NOTE — Plan of Care (Signed)
Patient was seen and examined at bedside, patient was admitted overnight due to anasarca, lower extremity edema scrotal edema and pleural effusion. DC'd IV Lasix push and started on IV Lasix infusion for slow diuresis, continue antihypertensive medications and use hydralazine as needed with holding parameters.  Gradually wean off oxygen.  We will continue rest of the current treatment and follow along.  Continue fluid restriction 1.5 L/day

## 2022-09-20 NOTE — Assessment & Plan Note (Signed)
Basal insulin with sliding scale coverage Patient was recently seen by a nutritionist Follow A1c

## 2022-09-20 NOTE — Assessment & Plan Note (Addendum)
Podiatry consult Pain control

## 2022-09-20 NOTE — Assessment & Plan Note (Addendum)
Hypertensive emergency Bilateral pleural effusions Acute respiratory failure with hypoxia Medication nonadherence Patient presents with scrotal edema, bilateral pleural effusion on chest x-ray, BNP over 1700 Echo from 2022 showing grade 1 diastolic dysfunction IV Lasix Restart metoprolol, losartan, Imdur Supplemental oxygen to keep sats over 94% Daily weights with intake and output monitoring Echocardiogram

## 2022-09-20 NOTE — Consult Note (Signed)
CARDIOLOGY CONSULT NOTE               Patient ID: Chad Every Switalski Sr. MRN: [id number] DOB/AGE: March 25, 1961 62 y.o.  Admit date: 09/20/2022 Referring Physician Dr. Judd Gaudier hospitalist Primary Physician Dr. Gladstone Lighter primary Primary Cardiologist Dr. Saralyn Pilar Reason for Consultation congestive heart failure diastolic dysfunction shortness of breath anasarca  HPI: Patient is a 62 year old male multiple medical problems hypertension A-fib and diabetes nonobstructive CAD diastolic congestive heart failure hyperlipidemia shortness of breath dyspnea presented to the emergency room with swollen scrotum lower extremity edema sore to from trauma the patient recently returned from a trip to [place] has not seen cardiologist in 1 to 2 years.  When the patient presented to emergency room he was hypertensive urgency systolic blood pressure over 200 BMP was 1800 denies any chest pain but had significant pleural effusions on chest x-ray patient states he ran out of his medicines and has not been taking anything in quite some time last saw his primary physician back in November.  Patient was given diuretics to help with the swelling and medicines for blood pressure control cardiology was then advised for follow-up.  Review of systems complete and found to be negative unless listed above     Past Medical History:  Diagnosis Date   Diabetes mellitus without complication ([place])    High cholesterol    Hypertension    Stomach ulcer     Past Surgical History:  Procedure Laterality Date   COLONOSCOPY     FEMUR CLOSED REDUCTION      Medications Prior to Admission  Medication Sig Dispense Refill Last Dose   glipiZIDE (GLUCOTROL XL) 5 MG 24 hr tablet Take 5 mg by mouth daily with breakfast.   Past Month   insulin NPH-regular Human (70-30) 100 UNIT/ML injection Inject 15 Units into the skin 2 (two) times daily with a meal.   09/17/2022 at unknown   losartan-hydrochlorothiazide (HYZAAR) 50-12.5 MG  tablet Take 1 tablet by mouth daily.   Past Month   rosuvastatin (CRESTOR) 5 MG tablet Take 5 mg by mouth daily.   Past Month   aspirin 81 MG EC tablet Take 81 mg by mouth daily. (Patient not taking: Reported on 09/20/2022)   Not Taking   glipiZIDE-metformin (METAGLIP) 5-500 MG tablet Take 1 tablet by mouth 2 (two) times daily. (Patient not taking: Reported on 09/20/2022)   Not Taking   isosorbide mononitrate (IMDUR) 30 MG 24 hr tablet Take 30 mg by mouth daily. (Patient not taking: Reported on 09/20/2022)   Not Taking   losartan (COZAAR) 25 MG tablet Take 50 mg by mouth daily. (Patient not taking: Reported on 09/08/2021)   Not Taking   metoprolol tartrate (LOPRESSOR) 25 MG tablet Take 12.5 mg by mouth 2 (two) times daily. (Patient not taking: Reported on 09/08/2021)   Not Taking   pregabalin (LYRICA) 25 MG capsule Take 25 mg by mouth in the morning and at bedtime. (Patient not taking: Reported on 09/08/2021)      simvastatin (ZOCOR) 40 MG tablet Take 40 mg by mouth at bedtime. (Patient not taking: Reported on 09/08/2021)   Not Taking   Vitamin D, Ergocalciferol, (DRISDOL) 1.25 MG (50000 UNIT) CAPS capsule Take 1 capsule by mouth once a week. (Patient not taking: Reported on 09/08/2021)   Not Taking   Social History   Socioeconomic History   Marital status: Single    Spouse name: Not on file   Number of children: Not on file  Years of education: Not on file   Highest education level: Not on file  Occupational History   Not on file  Tobacco Use   Smoking status: Every Day    Packs/day: 0    Types: Cigars, Cigarettes    Start date: 07/07/1995   Smokeless tobacco: Never   Tobacco comments:    2-3 cigars/day  Substance and Sexual Activity   Alcohol use: No   Drug use: No   Sexual activity: Yes  Other Topics Concern   Not on file  Social History Narrative   Not on file   Social Determinants of Health   Financial Resource Strain: Not on file  Food Insecurity: Not on file  Transportation Needs:  Not on file  Physical Activity: Not on file  Stress: Not on file  Social Connections: Not on file  Intimate Partner Violence: Not on file    Family History  Problem Relation Age of Onset   Diabetes Father       Review of systems complete and found to be negative unless listed above      PHYSICAL EXAM  General: Well developed, well nourished, in no acute distress HEENT:  Normocephalic and atramatic Neck:  No JVD.  Lungs: Clear bilaterally to auscultation and percussion. Heart: HRRR . Normal S1 and S2 without gallops or murmurs.  Abdomen: Bowel sounds are positive, abdomen soft and non-tender  Msk:  Back normal, normal gait. Normal strength and tone for age. Extremities: No clubbing, cyanosis or 2+edema.   Neuro: Alert and oriented X 3. Psych:  Good affect, responds appropriately Significant scrotal swelling  Labs:   Lab Results  Component Value Date   WBC 7.5 09/20/2022   HGB 12.7 (L) 09/20/2022   HCT 38.3 (L) 09/20/2022   MCV 94.3 09/20/2022   PLT 213 09/20/2022    Recent Labs  Lab 09/20/22 0109  NA 134*  K 3.6  CL 103  CO2 27  BUN 17  CREATININE 1.20  CALCIUM 8.0*  GLUCOSE 168*   Lab Results  Component Value Date   TROPONINI <0.03 01/29/2018    Lab Results  Component Value Date   CHOL 196 07/17/2020   Lab Results  Component Value Date   HDL 31 (L) 07/17/2020   Lab Results  Component Value Date   LDLCALC 109 (H) 07/17/2020   Lab Results  Component Value Date   TRIG 282 (H) 07/17/2020   Lab Results  Component Value Date   CHOLHDL 6.3 07/17/2020   No results found for: "LDLDIRECT"    Radiology: US SCROTUM W/DOPPLER  Result Date: 09/20/2022 CLINICAL DATA:  Scrotal swell EXAM: SCROTAL ULTRASOUND DOPPLER ULTRASOUND OF THE TESTICLES TECHNIQUE: Complete ultrasound examination of the testicles, epididymis, and other scrotal structures was performed. Color and spectral Doppler ultrasound were also utilized to evaluate blood flow to the  testicles. COMPARISON:  None Available. FINDINGS: Right testicle Measurements: 4.2 x 2.9 x 2.7 cm. No mass or microlithiasis visualized. Left testicle Measurements: 4.1 x 2.9 x 2.5 cm. No mass or microlithiasis visualized. Right epididymis:  Normal in size and appearance. Left epididymis:  Normal in size and appearance. Hydrocele:  None visualized. Varicocele:  None visualized. Pulsed Doppler interrogation of both testes demonstrates normal low resistance arterial and venous waveforms bilaterally. Other: Marked bilateral scrotal wall thickening, 4.9 cm on the right and 3.9 cm on the left. IMPRESSION: Scrotal wall thickening bilaterally. No testicular abnormality. Electronically Signed   By: Rolm Baptise M.D.   On: 09/20/2022 02:24  DG Toe Great Left  Result Date: 09/20/2022 CLINICAL DATA:  Left great toe hematoma. EXAM: LEFT GREAT TOE COMPARISON:  None Available. FINDINGS: A nondisplaced fracture deformity is seen involving the medial aspect of the base of the distal phalanx of the left great toe. There is no evidence of dislocation. There is no evidence of arthropathy or other focal bone abnormality. There is mild diffuse soft tissue swelling. IMPRESSION: Nondisplaced fracture of the distal phalanx of the left great toe. Electronically Signed   By: Virgina Norfolk M.D.   On: 09/20/2022 01:40   DG Chest 2 View  Result Date: 09/20/2022 CLINICAL DATA:  Chest pain and shortness of breath. EXAM: CHEST - 2 VIEW COMPARISON:  April 03, 2021 FINDINGS: The heart size and mediastinal contours are within normal limits. Low lung volumes are seen. Marked severity airspace disease is noted within the right lung base with mild to moderate severity linear atelectasis seen within the mid and lower left lung. There is a small to moderate sized right-sided pleural effusion. A very small left pleural effusion is also suspected. No pneumothorax is identified. The visualized skeletal structures are unremarkable.  IMPRESSION: 1. Low lung volumes with marked severity right basilar airspace disease. 2. Mild to moderate severity linear atelectasis within the mid and lower left lung. 3. Bilateral pleural effusions, right greater than left. Electronically Signed   By: Virgina Norfolk M.D.   On: 09/20/2022 01:38    EKG: Normal sinus rhythm minimal voltage criteria for LVH nonspecific ST changes rate of 90 mild prolonged QT   ASSESSMENT AND PLAN:  Acute on chronic diastolic congestive heart failure Hypertensive urgency Shortness of breath Bilateral pleural effusions Anasarca Acute respiratory failure with hypoxemia Elevated troponin consistent with demand ischemia Diabetes Anemia Noncompliance . Plan Agree admit to telemetry rule out myocardial infarction follow-up EKGs and troponins Agree with IV diuretic therapy for shortness of breath pleural effusion and anasarca Recommend hypertension management with ARB beta-blocker spironolactone Heart failure therapy diastolic dysfunction previously managed with losartan metoprolol institute spironolactone therapy Anticoagulation with heparin initially for ACS but then daily for DVT prophylaxis Supplemental oxygen therapy for hypoxemia respiratory failure Aggressive IV diuresis to help with scrotal swelling and anasarca Repeat echocardiogram for assessment of left ventricular function wall motion and valvular structures Recommend statin therapy for hyperlipidemia Consider adding Farxiga for heart failure management  Signed: Yolonda Kida MD 09/20/2022, 12:46 PM

## 2022-09-20 NOTE — Progress Notes (Signed)
Brief cardiology consult note  Impression Chest pain possible anginal Acute on chronic congestive heart failure Anasarca Diabetes Hypertension Hyperlipidemia Hypoxemia Noncompliance  Plan Agree with admit to telemetry follow-up EKGs troponins .  Therapy for acute on chronic diastolic heart failure IV diuretic therapy to help with pleural effusions and scrotal swelling Supplemental oxygen as necessary hypoxemia Inhalers Echocardiogram for assessment of left ventricular function wall motion and valvular structures Continue diabetes management and control  Full consult note to follow  [facility]

## 2022-09-20 NOTE — ED Notes (Signed)
EDP Karma Greaser informed of pts arrival to room

## 2022-09-20 NOTE — ED Provider Notes (Signed)
[facility] Provider Note    Event Date/Time   First MD Initiated Contact with Patient 09/20/22 [phone]     (approximate)   History   Chest Pain and Groin Swelling   HPI  Chad Stahlberg Sr. is a 62 y.o. male who presents for evaluation of swelling to his penis and scrotum as his primary concern, but he is also been having some chest pain and shortness of breath for the last week.  He reports that he helped his knees moved to [place] and has been exerting himself.  He was driving yesterday and flying back recently as well.  He did not sustain any injury of which he is aware, but his symptoms have been worsening over time, possibly more than a week.  He also said that he does not think he has been taking his blood pressure medicine for at least a month and he knows that he should.  He does not remember sustaining an injury to his left great toe but it is swollen and a little bit painful even though he is ambulating without difficulty.  He is mostly concerned about the substantial swelling in his scrotum.  It is not particularly painful but he does not understand why it is swollen.  He said he always has some swelling in his legs.  He does not think that he has any history of heart attack nor of heart failure.  He does not take any fluid pills and does not think he has a history of CHF.     Physical Exam   Triage Vital Signs: ED Triage Vitals  Enc Vitals Group     BP 09/20/22 0050 (!) 185/97     Pulse Rate 09/20/22 0050 91     Resp 09/20/22 0050 20     Temp 09/20/22 0050 98.5 F (36.9 C)     Temp Source 09/20/22 0050 Oral     SpO2 09/20/22 0050 95 %     Weight 09/20/22 0055 83.9 kg (185 lb)     Height 09/20/22 0055 1.676 m (5\' 6" )     Head Circumference --      Peak Flow --      Pain Score 09/20/22 0055 10     Pain Loc --      Pain Edu? --      Excl. in [place]? --     Most recent vital signs: Vitals:   09/20/22 0304 09/20/22 0306  BP: (!) 202/123   Pulse:  96   Resp: 19   Temp:    SpO2: (!) 84% 97%     General: Awake, mild respiratory distress but not substantial while at rest. CV:  Good peripheral perfusion.  Normal heart sounds.  Regular rate and rhythm. Resp:  Normal effort but with mild tachypnea.  Speaking easily and comfortably, no accessory muscle usage nor intercostal retractions.  No wheezing upon auscultation but some coarse breath sounds. Abd:  No distention.  No tenderness to palpation although his abdomen is somewhat firm Other:  Patient has substantial edema of the penis and the scrotum.  It is nontender to palpation although the amount of swelling is causing some discomfort in general simply because of the stretching and the size to which it has expanded.  He has pitting edema in his lower extremities as well.  Overall the presentation is consistent with anasarca.   ED Results / Procedures / Treatments   Labs (all labs ordered are listed, but only abnormal  results are displayed) Labs Reviewed  BASIC METABOLIC PANEL - Abnormal; Notable for the following components:      Result Value   Sodium 134 (*)    Glucose, Bld 168 (*)    Calcium 8.0 (*)    Anion gap 4 (*)    All other components within normal limits  CBC - Abnormal; Notable for the following components:   RBC 4.06 (*)    Hemoglobin 12.7 (*)    HCT 38.3 (*)    All other components within normal limits  BRAIN NATRIURETIC PEPTIDE - Abnormal; Notable for the following components:   B Natriuretic Peptide 1,778.7 (*)    All other components within normal limits  URINALYSIS, ROUTINE W REFLEX MICROSCOPIC - Abnormal; Notable for the following components:   Color, Urine AMBER (*)    APPearance CLEAR (*)    Protein, ur >=300 (*)    Leukocytes,Ua TRACE (*)    All other components within normal limits  TROPONIN I (HIGH SENSITIVITY) - Abnormal; Notable for the following components:   Troponin I (High Sensitivity) 37 (*)    All other components within normal limits   TROPONIN I (HIGH SENSITIVITY) - Abnormal; Notable for the following components:   Troponin I (High Sensitivity) 39 (*)    All other components within normal limits  CHLAMYDIA/NGC RT PCR [facility] ONLY)               EKG  ED ECG REPORT I, Hinda Kehr, the attending physician, personally viewed and interpreted this ECG.  Date: 09/20/2022 EKG Time: 1:02 AM Rate: 90 Rhythm: sinus rhythm with PVCs QRS Axis: normal Intervals: normal ST/T Wave abnormalities: Non-specific ST segment / T-wave changes, but no clear evidence of acute ischemia. Narrative Interpretation: no definitive evidence of acute ischemia; does not meet STEMI criteria.    RADIOLOGY I viewed and interpreted the patient's two-view chest x-ray.  He has some pleural effusions and pulmonary vascular congestion.  Radiologist reports the effusions and what appears to be atelectasis.  I also viewed and interpreted the patient's left great toe x-rays, and he has a nondisplaced distal phalanx fracture of the left great toe.  I also viewed and interpreted the patient's scrotal ultrasound and I see no obvious acute abnormality other than the generalized swelling.  Radiology mentioned that the skin is thickened but otherwise normal.    PROCEDURES:  Critical Care performed: Yes, see critical care procedure note(s)  .1-3 Lead EKG Interpretation  Performed by: Hinda Kehr, MD Authorized by: Hinda Kehr, MD     Interpretation: normal     ECG rate:  75   ECG rate assessment: normal     Rhythm: sinus rhythm     Ectopy: none     Conduction: normal   .Critical Care  Performed by: Hinda Kehr, MD Authorized by: Hinda Kehr, MD   Critical care provider statement:    Critical care time (minutes):  45   Critical care time was exclusive of:  Separately billable procedures and treating other patients   Critical care was necessary to treat or prevent imminent or life-threatening deterioration of the following conditions:   Cardiac failure   Critical care was time spent personally by me on the following activities:  Development of treatment plan with patient or surrogate, evaluation of patient's response to treatment, examination of patient, obtaining history from patient or surrogate, ordering and performing treatments and interventions, ordering and review of laboratory studies, ordering and review of radiographic studies, pulse oximetry, re-evaluation of patient's  condition and review of old charts    MEDICATIONS ORDERED IN ED: Medications  nitroGLYCERIN (NITROGLYN) 2 % ointment 1 inch (has no administration in time range)  furosemide (LASIX) injection 80 mg (has no administration in time range)     IMPRESSION / MDM / ASSESSMENT AND PLAN / ED COURSE  I reviewed the triage vital signs and the nursing notes.                              Differential diagnosis includes, but is not limited to, CHF, COPD, ACS, PE, pneumonia, pneumothorax.  Patient's presentation is most consistent with acute presentation with potential threat to life or bodily function.  Labs/studies ordered:  EKG, two-view chest x-ray, left great toe x-ray, scrotal ultrasound with Doppler, CBC, BMP, BNP, urinalysis, gonorrhea/chlamydia (open x-rays), troponin Interventions/Medications given: Furosemide 80 mg IV, oxygen by nasal cannula, nitroglycerin paste 1 inch. [facility] Course my include additional interventions or labs/studies not listed above.)  Vital signs are notable for hypertensive urgency but otherwise normal.  He is not in substantial respiratory distress and is not reporting any chest pain at this time.  He has no tachycardia.  He is hypoxic but he also has anasarca and it looks like pulmonary vascular congestion or pulmonary edema with pleural effusions.  I looked up a prior echocardiogram in the system and see he has an EF of 50 to 55% but no listed diagnosis of CHF and he does not appear to be on any diuretics.  I ordered  furosemide 80 mg IV, supplemental oxygen, nitroglycerin paste for hypertensive urgency, and will admit the patient for further management.  I explained all this to him and he agrees.  I considered a CTA of the chest but based on his vital signs, prior history of reduced ejection fraction, noncompliance with his antihypertensives, etc., I think this is much more likely diagnosis than a PE.  I will consult the hospitalist for admission.  The patient is on the cardiac monitor to evaluate for evidence of arrhythmia and/or significant heart rate changes.   Clinical Course as of 09/20/22 [id number]  Sun Sep 20, 2022  0402 Consulted with Dr. Damita Dunnings with the hospitalist service who will admit the patient [CF]    Clinical Course User Index [CF] Hinda Kehr, MD     FINAL CLINICAL IMPRESSION(S) / ED DIAGNOSES   Final diagnoses:  Acute respiratory failure with hypoxia ([place])  Anasarca  Pleural effusion  Closed nondisplaced fracture of distal phalanx of left great toe, initial encounter     Rx / DC Orders   ED Discharge Orders     None        Note:  This document was prepared using Dragon voice recognition software and may include unintentional dictation errors.   Hinda Kehr, MD 09/20/22 0830

## 2022-09-20 NOTE — ED Notes (Signed)
MD Karma Greaser notified of pt's vital signs

## 2022-09-20 NOTE — Assessment & Plan Note (Signed)
Nonobstructive CAD Patient denies chest pain, EKG nonacute Had an abnormal nuclear stress test in 2022 and a coronary calcium score of 9 Elevated troponin of 39 likely secondary to demand ischemia Follow-up echocardiogram to evaluate for wall motion abnormality Resume metoprolol, simvastatin, losartan and aspirin

## 2022-09-20 NOTE — ED Notes (Signed)
PT brought to ed rm 9 at this time, this RN now assuming care.

## 2022-09-21 ENCOUNTER — Inpatient Hospital Stay
Admit: 2022-09-21 | Discharge: 2022-09-21 | Disposition: A | Discharge status: Home / Self Care | Payer: BLUE CROSS/BLUE SHIELD | Attending: Internal Medicine | Admitting: Internal Medicine

## 2022-09-21 ENCOUNTER — Other Ambulatory Visit (HOSPITAL_COMMUNITY): Payer: Self-pay

## 2022-09-21 DIAGNOSIS — S92425A Nondisplaced fracture of distal phalanx of left great toe, initial encounter for closed fracture: Secondary | ICD-10-CM

## 2022-09-21 DIAGNOSIS — I161 Hypertensive emergency: Secondary | ICD-10-CM | POA: Diagnosis not present

## 2022-09-21 LAB — CBC
HCT: 36.7 % — ABNORMAL LOW (ref 39.0–52.0)
Hemoglobin: 12.5 g/dL — ABNORMAL LOW (ref 13.0–17.0)
MCH: 31.2 pg (ref 26.0–34.0)
MCHC: 34.1 g/dL (ref 30.0–36.0)
MCV: 91.5 fL (ref 80.0–100.0)
Platelets: 220 10*3/uL (ref 150–400)
RBC: 4.01 MIL/uL — ABNORMAL LOW (ref 4.22–5.81)
RDW: 13.3 % (ref 11.5–15.5)
WBC: 9 10*3/uL (ref 4.0–10.5)
nRBC: 0 % (ref 0.0–0.2)

## 2022-09-21 LAB — BASIC METABOLIC PANEL
Anion gap: 10 (ref 5–15)
BUN: 22 mg/dL (ref 8–23)
CO2: 31 mmol/L (ref 22–32)
Calcium: 8.8 mg/dL — ABNORMAL LOW (ref 8.9–10.3)
Chloride: 97 mmol/L — ABNORMAL LOW (ref 98–111)
Creatinine, Ser: 1.33 mg/dL — ABNORMAL HIGH (ref 0.61–1.24)
GFR, Estimated: >60 mL/min (ref >60)
Glucose, Bld: 126 mg/dL — ABNORMAL HIGH (ref 70–99)
Potassium: 3.3 mmol/L — ABNORMAL LOW (ref 3.5–5.1)
Sodium: 138 mmol/L (ref 135–145)

## 2022-09-21 LAB — GLUCOSE, CAPILLARY
Glucose-Capillary: 127 mg/dL — ABNORMAL HIGH (ref 70–99)
Glucose-Capillary: 46 mg/dL — ABNORMAL LOW (ref 70–99)
Glucose-Capillary: 103 mg/dL — ABNORMAL HIGH (ref 70–99)
Glucose-Capillary: 55 mg/dL — ABNORMAL LOW (ref 70–99)
Glucose-Capillary: 153 mg/dL — ABNORMAL HIGH (ref 70–99)

## 2022-09-21 LAB — HEMOGLOBIN A1C
Hgb A1c MFr Bld: 7.1 % — ABNORMAL HIGH (ref 4.8–5.6)
Mean Plasma Glucose: 157 mg/dL

## 2022-09-21 LAB — VITAMIN D 25 HYDROXY (VIT D DEFICIENCY, FRACTURES): Vit D, 25-Hydroxy: 6.4 ng/mL — ABNORMAL LOW (ref 30–100)

## 2022-09-21 LAB — PHOSPHORUS: Phosphorus: 4.2 mg/dL (ref 2.5–4.6)

## 2022-09-21 LAB — MAGNESIUM: Magnesium: 1.7 mg/dL (ref 1.7–2.4)

## 2022-09-21 MED ORDER — POTASSIUM CHLORIDE CRYS ER 20 MEQ PO TBCR
40.0000 meq | EXTENDED_RELEASE_TABLET | Freq: Once | ORAL | Status: AC
  Administered 2022-09-21: 40 meq via ORAL
  Filled 2022-09-21: qty 2

## 2022-09-21 MED ORDER — INSULIN ASPART 100 UNIT/ML IJ SOLN
0.0000 [IU] | Freq: Three times a day (TID) | INTRAMUSCULAR | Status: DC
  Administered 2022-09-22 (×2): 3 [IU] via SUBCUTANEOUS
  Administered 2022-09-23 (×2): 2 [IU] via SUBCUTANEOUS
  Filled 2022-09-21 (×4): qty 1

## 2022-09-21 MED ORDER — GLUCOSE 40 % PO GEL
1.0000 | ORAL | Status: AC
  Administered 2022-09-21: 31 g via ORAL
  Filled 2022-09-21: qty 1

## 2022-09-21 MED ORDER — METOPROLOL SUCCINATE ER 50 MG PO TB24
50.0000 mg | ORAL_TABLET | Freq: Every day | ORAL | Status: DC
  Administered 2022-09-22: 50 mg via ORAL
  Filled 2022-09-21: qty 1

## 2022-09-21 MED ORDER — SPIRONOLACTONE 25 MG PO TABS
25.0000 mg | ORAL_TABLET | Freq: Every day | ORAL | Status: DC
  Administered 2022-09-22 – 2022-09-23 (×2): 25 mg via ORAL
  Filled 2022-09-21 (×2): qty 1

## 2022-09-21 MED ORDER — INSULIN ASPART 100 UNIT/ML IJ SOLN: 0.0000 [IU] | Freq: Every day | INTRAMUSCULAR | Status: DC

## 2022-09-21 NOTE — Progress Notes (Signed)
[facility] NOTE       Patient ID: Arizona Hashimoto Sr. MRN: [id number] DOB/AGE: 61-Dec-1962 61 y.o.  Admit date: 09/20/2022 Referring Physician Dr. Judd Gaudier  Primary Physician Dr. Tressia Miners  Primary Cardiologist Dr. Clayborn Bigness Reason for Consultation CHF  HPI: Chad Pica Sr. Is a 62yoM with a PMH of HFpEF (50-55%, G1DD, mild - mod aortic sclerosis 07/2020), nonobstructive CAD by cCTA 12/2020, HTN, DM2 (7.1%), who presented to [facility] ED 09/20/2022 with swollen genitals and a sore on his great toe after a trip to [place]. He was also hypoxic to 84% on room air & hypertensive to 202/123 on admission. Admits to not taking his BP medications or insulin for several months. Feeling better after initial IV diuresis.   Interval History: - denies chest pain, shortness of breath, groin pain  - diuresing briskly  - echo pending   Review of systems complete and found to be negative unless listed above     Past Medical History:  Diagnosis Date   Diabetes mellitus without complication ([place])    High cholesterol    Hypertension    Stomach ulcer     Past Surgical History:  Procedure Laterality Date   COLONOSCOPY     FEMUR CLOSED REDUCTION      Medications Prior to Admission  Medication Sig Dispense Refill Last Dose   glipiZIDE (GLUCOTROL XL) 5 MG 24 hr tablet Take 5 mg by mouth daily with breakfast.   Past Month   insulin NPH-regular Human (70-30) 100 UNIT/ML injection Inject 15 Units into the skin 2 (two) times daily with a meal.   09/17/2022 at unknown   losartan-hydrochlorothiazide (HYZAAR) 50-12.5 MG tablet Take 1 tablet by mouth daily.   Past Month   rosuvastatin (CRESTOR) 5 MG tablet Take 5 mg by mouth daily.   Past Month   aspirin 81 MG EC tablet Take 81 mg by mouth daily. (Patient not taking: Reported on 09/20/2022)   Not Taking   glipiZIDE-metformin (METAGLIP) 5-500 MG tablet Take 1 tablet by mouth 2 (two) times daily. (Patient not taking: Reported on 09/20/2022)    Not Taking   isosorbide mononitrate (IMDUR) 30 MG 24 hr tablet Take 30 mg by mouth daily. (Patient not taking: Reported on 09/20/2022)   Not Taking   losartan (COZAAR) 25 MG tablet Take 50 mg by mouth daily. (Patient not taking: Reported on 09/08/2021)   Not Taking   metoprolol tartrate (LOPRESSOR) 25 MG tablet Take 12.5 mg by mouth 2 (two) times daily. (Patient not taking: Reported on 09/08/2021)   Not Taking   pregabalin (LYRICA) 25 MG capsule Take 25 mg by mouth in the morning and at bedtime. (Patient not taking: Reported on 09/08/2021)      simvastatin (ZOCOR) 40 MG tablet Take 40 mg by mouth at bedtime. (Patient not taking: Reported on 09/08/2021)   Not Taking   Vitamin D, Ergocalciferol, (DRISDOL) 1.25 MG (50000 UNIT) CAPS capsule Take 1 capsule by mouth once a week. (Patient not taking: Reported on 09/08/2021)   Not Taking   Social History   Socioeconomic History   Marital status: Single    Spouse name: Not on file   Number of children: Not on file   Years of education: Not on file   Highest education level: Not on file  Occupational History   Not on file  Tobacco Use   Smoking status: Every Day    Packs/day: 0    Types: Cigars, Cigarettes    Start  date: 07/07/1995   Smokeless tobacco: Never   Tobacco comments:    2-3 cigars/day  Substance and Sexual Activity   Alcohol use: No   Drug use: No   Sexual activity: Yes  Other Topics Concern   Not on file  Social History Narrative   Not on file   Social Determinants of Health   Financial Resource Strain: Not on file  Food Insecurity: Not on file  Transportation Needs: Not on file  Physical Activity: Not on file  Stress: Not on file  Social Connections: Not on file  Intimate Partner Violence: Not on file    Family History  Problem Relation Age of Onset   Diabetes Father       Intake/Output Summary (Last 24 hours) at 09/21/2022 0912 Last data filed at 09/21/2022 0405 Gross per 24 hour  Intake 738.15 ml  Output 7350 ml  Net  -6611.85 ml    Vitals:   09/21/22 0009 09/21/22 0358 09/21/22 0513 09/21/22 0734  BP: (!) 162/96 (!) 140/70  (!) 147/80  Pulse: 84 82  85  Resp: 18 18  18   Temp: 98.7 F (37.1 C) 98.8 F (37.1 C)  98.8 F (37.1 C)  TempSrc:      SpO2: 93% 92% (!) 84% 97%  Weight:  81.2 kg    Height:        PHYSICAL EXAM General: middle aged black male , well nourished, in no acute distress. HEENT:  Normocephalic and atraumatic. Neck:  No JVD.  Lungs: Normal respiratory effort on room air. Clear bilaterally to auscultation. No wheezes, crackles, rhonchi.  Heart: HRRR . Normal S1 and S2 without gallops or murmurs.  Abdomen: Non-distended appearing.  Msk: Normal strength and tone for age. Extremities: Warm and well perfused. No clubbing, cyanosis. Trace bilateral peripheral edema.  Neuro: Alert and oriented X 3. Psych:  Answers questions appropriately.   Labs: Basic Metabolic Panel: Recent Labs    09/20/22 0109 09/20/22 0556 09/21/22 0552  NA 134*  --  138  K 3.6  --  3.3*  CL 103  --  97*  CO2 27  --  31  GLUCOSE 168*  --  126*  BUN 17  --  22  CREATININE 1.20  --  1.33*  CALCIUM 8.0*  --  8.8*  MG  --  1.9 1.7  PHOS  --  3.3 4.2   Liver Function Tests: No results for input(s): "AST", "ALT", "ALKPHOS", "BILITOT", "PROT", "ALBUMIN" in the last 72 hours. No results for input(s): "LIPASE", "AMYLASE" in the last 72 hours. CBC: Recent Labs    09/20/22 0109 09/21/22 0552  WBC 7.5 9.0  HGB 12.7* 12.5*  HCT 38.3* 36.7*  MCV 94.3 91.5  PLT 213 220   Cardiac Enzymes: Recent Labs    09/20/22 0109 09/20/22 0313  TROPONINIHS 37* 39*   BNP: Recent Labs    09/20/22 0110  BNP 1,778.7*   D-Dimer: No results for input(s): "DDIMER" in the last 72 hours. Hemoglobin A1C: No results for input(s): "HGBA1C" in the last 72 hours. Fasting Lipid Panel: No results for input(s): "CHOL", "HDL", "LDLCALC", "TRIG", "CHOLHDL", "LDLDIRECT" in the last 72 hours. Thyroid Function Tests: No  results for input(s): "TSH", "T4TOTAL", "T3FREE", "THYROIDAB" in the last 72 hours.  Invalid input(s): "FREET3" Anemia Panel: Recent Labs    09/20/22 0600  VITAMINB12 425  FOLATE 12.1  FERRITIN 128  TIBC 333  IRON 48  RETICCTPCT 2.3     Radiology: US SCROTUM W/DOPPLER  Result Date: 09/20/2022 CLINICAL DATA:  Scrotal swell EXAM: SCROTAL ULTRASOUND DOPPLER ULTRASOUND OF THE TESTICLES TECHNIQUE: Complete ultrasound examination of the testicles, epididymis, and other scrotal structures was performed. Color and spectral Doppler ultrasound were also utilized to evaluate blood flow to the testicles. COMPARISON:  None Available. FINDINGS: Right testicle Measurements: 4.2 x 2.9 x 2.7 cm. No mass or microlithiasis visualized. Left testicle Measurements: 4.1 x 2.9 x 2.5 cm. No mass or microlithiasis visualized. Right epididymis:  Normal in size and appearance. Left epididymis:  Normal in size and appearance. Hydrocele:  None visualized. Varicocele:  None visualized. Pulsed Doppler interrogation of both testes demonstrates normal low resistance arterial and venous waveforms bilaterally. Other: Marked bilateral scrotal wall thickening, 4.9 cm on the right and 3.9 cm on the left. IMPRESSION: Scrotal wall thickening bilaterally. No testicular abnormality. Electronically Signed   By: Rolm Baptise M.D.   On: 09/20/2022 02:24   DG Toe Great Left  Result Date: 09/20/2022 CLINICAL DATA:  Left great toe hematoma. EXAM: LEFT GREAT TOE COMPARISON:  None Available. FINDINGS: A nondisplaced fracture deformity is seen involving the medial aspect of the base of the distal phalanx of the left great toe. There is no evidence of dislocation. There is no evidence of arthropathy or other focal bone abnormality. There is mild diffuse soft tissue swelling. IMPRESSION: Nondisplaced fracture of the distal phalanx of the left great toe. Electronically Signed   By: Virgina Norfolk M.D.   On: 09/20/2022 01:40   DG Chest 2  View  Result Date: 09/20/2022 CLINICAL DATA:  Chest pain and shortness of breath. EXAM: CHEST - 2 VIEW COMPARISON:  April 03, 2021 FINDINGS: The heart size and mediastinal contours are within normal limits. Low lung volumes are seen. Marked severity airspace disease is noted within the right lung base with mild to moderate severity linear atelectasis seen within the mid and lower left lung. There is a small to moderate sized right-sided pleural effusion. A very small left pleural effusion is also suspected. No pneumothorax is identified. The visualized skeletal structures are unremarkable. IMPRESSION: 1. Low lung volumes with marked severity right basilar airspace disease. 2. Mild to moderate severity linear atelectasis within the mid and lower left lung. 3. Bilateral pleural effusions, right greater than left. Electronically Signed   By: Virgina Norfolk M.D.   On: 09/20/2022 01:38    ECHO 07/17/2020  1. Left ventricular ejection fraction, by estimation, is 50 to 55%. The  left ventricle has low normal function. The left ventricle has no regional  wall motion abnormalities. There is mild left ventricular hypertrophy.  Left ventricular diastolic  parameters are consistent with Grade I diastolic dysfunction (impaired  relaxation).   2. Right ventricular systolic function was not well visualized. The right  ventricular size is normal.   3. The mitral valve is normal in structure. No evidence of mitral valve  regurgitation.   4. The aortic valve is tricuspid. Aortic valve regurgitation is not  visualized. Mild to moderate aortic valve sclerosis/calcification is  present, without any evidence of aortic stenosis.    Coronary CTA 12/12/2020  IMPRESSION: 1. Coronary calcium score of 9.05. This was 63rd percentile for age and sex matched control.   2. Normal coronary origin with right dominance.   3. Mild calcified plaque in the proximal RCA causing minimal stenosis (<25%).   4. CAD-RADS 1.  Minimal non-obstructive CAD (0-24%). Consider non-atherosclerotic causes of chest pain. Consider preventive therapy and risk factor modification.   Electronically Signed: By:  Kate Sable M.D. On: 12/12/2020 12:56  TELEMETRY reviewed by me (LT) 09/21/2022 : NSR 80s-90s  EKG reviewed by me: NSR with PVC PACs and nonspecific TW abnormality   Data reviewed by me (LT) 09/21/2022: admission H&P, ed note, nursing notes, last 24h vitals tele labs imaging I/O    Principal Problem:   Hypertensive emergency Active Problems:   Diabetes mellitus, type II ([place])   Elevated troponin   Bilateral pleural effusion   Anasarca   Fracture of distal phalanx of left great toe   Scrotal edema   Acute respiratory failure with hypoxia ([facility])   Anemia   Acute on chronic diastolic CHF (congestive heart failure) ([place])   Coronary artery disease    ASSESSMENT AND PLAN:  Chad Pica Sr. Is a 39yoM with a PMH of HFpEF (50-55%, G1DD, mild - mod aortic sclerosis 07/2020), nonobstructive CAD by cCTA 12/2020, HTN, DM2 (7.1%), who presented to [facility] ED 09/20/2022 with swollen genitals and a sore on his great toe after a trip to [place]. He was also hypoxic to 84% on room air & hypertensive to 202/123 on admission. Admits to not taking his BP medications or insulin for several months. Clinical improvement after initial IV diuresis.   # hypertensive urgency  In the setting of medication noncompliance. Ongoing titration of BP meds & GDMT as below  # acute on chronic HFpEF (50-55%)  Hypervolemic with peripheral edema and groin swelling, BNP elevated at 1700, clinical improvement with initial diuresis - continue GDMT with metoprolol XL, losartan 50mg  daily, spiro 25mg  daily. No SGLT2i with scrotal edema/wall thicking on U/S - continue lasix gtt at 4mg /hr for now, likely discharge on torsemide 20 daily - monitor BMP daily with diuresis, Cr uptrending - echo complete   # demand ischemia # nonobstructive CAD by  cCTA Chest pain free, EKG nonacute. HS trop minimal elevation and insignificant delta at 37-39, most c/w demand ischemia and not ACS.  - continue aspirin 81mg  daily and atorva 40mg  daily   This patient's plan of care was discussed and created with Dr. Saralyn Pilar and he is in agreement.  Signed: Tristan Schroeder , PA-C 09/21/2022, 9:12 AM [facility]

## 2022-09-21 NOTE — Discharge Instructions (Signed)

## 2022-09-21 NOTE — TOC Benefit Eligibility Note (Addendum)
Patient Teacher, English as a foreign language completed.    The patient is currently admitted and upon discharge could be taking Entresto 24-26 mg.  The current 30 day co-pay is $337.35.   The patient is currently admitted and upon discharge could be taking Spironlactone 25 mg.  The current 30 day co-pay is $0.00.   The patient is insured through [employer] [place], [place] Patient [place] Patient Advocate Team Direct Number: [phone]  Fax: [phone]

## 2022-09-21 NOTE — Consult Note (Addendum)
   Heart Failure Nurse Navigator Note  HFpEF 50 to 55%.  Grade 1 diastolic dysfunction by echocardiogram performed in January 2022.  Echocardiogram to be performed on this admission is currently pending.  He presented to the emergency room with complaints of chest pain, shortness of breath and groin swelling.  BNP 1778.  Chest x-ray revealed bilateral pleural effusions with right greater than left.  Comorbidities:  Type 2 diabetes Nonobstructive coronary artery disease Neuropathy Medication noncompliance  Medications:  Aspirin 81 mg daily Atorvastatin 40 mg daily Furosemide infusion Isosorbide mononitrate 30 mg daily Losartan 50 mg daily Metoprolol succinate 25 mg daily Potassium chloride 40 mEq once  Labs:  Sodium 138, potassium 3.3, chloride 97, CO2 31, BUN 22, creatinine 1.33, estimated GFR greater than 60, magnesium 1.7, phosphorus 4.2.  Hemoglobin 12 5, hematocrit 36.7. Weight 81.2 kg Intake 978 mL Output 8100 mL  Initial meeting with patient on this admission.  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  States that he has a car and is able to drive.  He also states that he went through 12th grade and that he is able to read.  He states that he rarely eats at restaurants and will fix most of his foods at home.  Discussed things in their natural state and avoiding processed foods.  He states that when he does not feel like preparing the meals that he does rely on frozen dinners, discussed reading labels and sticking with less than 2000 mg of sodium intake daily.  Discussed fluid intake of no more than 64 ounces daily.  He states that his he has a scale at home.  Discussed daily weights and what to report.  Also discussed why he had not been taking his medications prior to admission.  He states when he would go to the pharmacy to pick up his medications he was told  that he did not have insurance.  States that his brother had been willing to help him until he discovered that his co-pay for  his insulin was $500.  Spoke with pharmacist in regards to patient stating there was confusion over if he had insurance or not. Raquel states that he does have insurance through [employer] and that he had not gone to his pharmacy at [facility] since June 2023.  States with brand names he will have a large co-pay  but generic can be $0-$3.  Discussed follow-up in the outpatient heart failure clinic for which he has an appointment on March 28 at 8:30 in the morning, he has a 0% no-show which is 0 out of 17 appointments missed.  He was given the living with heart failure teaching booklet, zone magnet, info on low-sodium and heart failure along with weight chart.  He had no further questions.  Pricilla Riffle RN CHFN

## 2022-09-21 NOTE — Inpatient Diabetes Management (Signed)
Inpatient Diabetes Program Recommendations  AACE/ADA: New Consensus Statement on Inpatient Glycemic Control (2015)  Target Ranges:  Prepandial:   less than 140 mg/dL      Peak postprandial:   less than 180 mg/dL (1-2 hours)      Critically ill patients:  140 - 180 mg/dL   Lab Results  Component Value Date   GLUCAP 127 (H) 09/21/2022   HGBA1C 11.8 (H) 07/16/2020    Latest Reference Range & Units Most Recent 09/20/22 12:15 09/20/22 12:42 09/20/22 13:08 09/20/22 17:00 09/20/22 21:58 09/21/22 07:33  Glucose-Capillary 70 - 99 mg/dL 127 (H) 09/21/22 07:33 55 (L) 63 (L) 86 133 (H) 197 (H) 127 (H)  (L): Data is abnormally low (H): Data is abnormally high  Diabetes history: DM2 Outpatient Diabetes medications: 70/30 15 units bid, Glucotrol 5 mg qd Current orders for Inpatient glycemic control: 70/30 15 units bid, Novolog 0-15 units tid, 0-5 units hs  Inpatient Diabetes Program Recommendations:   Pending A1c  Noted Glucotrol discontinued due to patient had a hypoglycemic event. Please consider: -Decrease Novolog correction to 0-9 units tid, 0-5 units hs  Thank you, Bethena Roys E. Victorio Creeden, RN, MSN, CDE  Diabetes Coordinator Inpatient Glycemic Control Team Team Pager [phone] (8am-5pm) 09/21/2022 10:37 AM

## 2022-09-21 NOTE — Progress Notes (Signed)
*  PRELIMINARY RESULTS* Echocardiogram 2D Echocardiogram has been performed.  Elpidio Anis 09/21/2022, 2:43 PM

## 2022-09-21 NOTE — Progress Notes (Signed)
[facility] Progress Note  Patient: Chad Good    [id number]  DOA: 09/20/2022     Date of Service: the patient was seen and examined on 09/21/2022  Chief Complaint  Patient presents with   Chest Pain   Groin Swelling   Brief hospital course: Chad Marcinek Sr. is a 62 y.o. 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   ED workup: O2 sat 84% on room air and blood pressure 202/123.  Troponin 39 and BNP 1778  EKG, personally viewed and interpreted shows sinus at 90 with PVCs and nonspecific ST T wave changes. Chest x-ray showed bilateral pleural effusions right greater than left Scrotal ultrasound showed scrotal wall thickening without testicular abnormality X-ray left great toe showed a nondisplaced fracture of the distal phalanx of the left great toe Patient treated with Lasix 80 mg IV and Nitropaste.  Hospitalist consulted for admission.   Assessment and Plan: Acute on chronic diastolic CHF (congestive heart failure) exacerbation Hypertensive emergency Bilateral pleural effusions Acute respiratory failure with hypoxia Medication nonadherence Patient presents with scrotal edema, bilateral pleural effusion on chest x-ray, BNP over 1700 Echo from 2022 showing grade 1 diastolic dysfunction S/p IV Lasix, 3/17 started Lasix IV infusion 8 mg/hr 3/18 decrease the rate of IV Lasix infusion 4 mg/h due to slightly elevated creatinine Continue metoprolol, losartan, Imdur Supplemental oxygen to keep sats over 94% Daily weights with intake and output monitoring Echocardiogram    Elevated troponin Nonobstructive CAD Patient denies chest  pain, EKG nonacute Had an abnormal nuclear stress test in 2022 and a coronary calcium score of 9 Elevated troponin of 39 likely secondary to demand ischemia Follow-up echocardiogram to evaluate for wall motion abnormality Resume metoprolol, simvastatin, losartan and aspirin Cardiology consulted   # Hypokalemia due to diuresis, potassium repleted. Monitor electrolytes and replete as needed.   Scrotal edema and Anasarca, due to diastolic CHF Scrotal ultrasound showing no testicular abnormality Expecting improvement with diuretic regimen as described above under CHF   Diabetes mellitus, type II, HbA1c 7.1, well-controlled Basal insulin with sliding scale coverage Patient was recently seen by a nutritionist    Anemia Hemoglobin 12.7, no recent baseline Anemia panel, transferrin saturation 14%, slightly low, folate and B12 within normal range.  Follow with PCP for further management as an outpatient     Fracture of distal phalanx of left great toe Podiatry consult Pain control   Body mass index is 28.89 kg/m.   Interventions:       Diet: Heart healthy/carb modified DVT Prophylaxis: Subcutaneous Lovenox   Advance goals of care discussion: Full code  Family Communication: family was not present at bedside, at the time of interview.  The pt provided permission to discuss medical plan with the family. Opportunity was given to ask question and all questions were answered satisfactorily.   Disposition:  Pt is from Home, admitted with anasarca, still has significant edema on IV Lasix infusion, which precludes a safe discharge. Discharge to home, when clinically improves, may need few more days.  Subjective: No significant overnight events, patient feels improvement in the lower extremity edema, shortness of breath is improving, denies any chest pain  or palpitation, no any other active issues.  Physical Exam: General: NAD, lying comfortably Appear in no distress, affect  appropriate Eyes: PERRLA ENT: Oral Mucosa Clear, moist  Neck: no JVD,  Cardiovascular: S1 and S2 Present, no Murmur,  Respiratory: good respiratory effort, Bilateral Air entry equal and Decreased, no Crackles, no wheezes Abdomen: Bowel Sound present, Soft and no tenderness,  Skin: no rashes Extremities: 4+ Pedal edema, no calf tenderness Neurologic: without any new focal findings Gait not checked due to patient safety concerns  Vitals:   09/21/22 0358 09/21/22 0513 09/21/22 0734 09/21/22 1136  BP: (!) 140/70  (!) 147/80 (!) 148/91  Pulse: 82  85 88  Resp: 18  18 20   Temp: 98.8 F (37.1 C)  98.8 F (37.1 C) 99.1 F (37.3 C)  TempSrc:      SpO2: 92% (!) 84% 97% 98%  Weight: 81.2 kg     Height:        Intake/Output Summary (Last 24 hours) at 09/21/2022 1444 Last data filed at 09/21/2022 1422 Gross per 24 hour  Intake 1198.15 ml  Output 4550 ml  Net -3351.85 ml   Filed Weights   09/20/22 0055 09/20/22 0856 09/21/22 0358  Weight: 83.9 kg 83.9 kg 81.2 kg    Data Reviewed: I have personally reviewed and interpreted daily labs, tele strips, imagings as discussed above. I reviewed all nursing notes, pharmacy notes, vitals, pertinent old records I have discussed plan of care as described above with RN and patient/family.  CBC: Recent Labs  Lab 09/20/22 0109 09/21/22 0552  WBC 7.5 9.0  HGB 12.7* 12.5*  HCT 38.3* 36.7*  MCV 94.3 91.5  PLT 213 [id number]   Basic Metabolic Panel: Recent Labs  Lab 09/20/22 0109 09/20/22 0556 09/21/22 0552  NA 134*  --  138  K 3.6  --  3.3*  CL 103  --  97*  CO2 27  --  31  GLUCOSE 168*  --  126*  BUN 17  --  22  CREATININE 1.20  --  1.33*  CALCIUM 8.0*  --  8.8*  MG  --  1.9 1.7  PHOS  --  3.3 4.2    Studies: No results found.  Scheduled Meds:  aspirin EC  81 mg Oral Daily   atorvastatin  40 mg Oral Daily   enoxaparin (LOVENOX) injection  40 mg Subcutaneous Q24H   insulin aspart  0-5 Units Subcutaneous QHS   insulin aspart  0-9  Units Subcutaneous TID WC   insulin aspart protamine- aspart  15 Units Subcutaneous BID WC   isosorbide mononitrate  30 mg Oral Daily   losartan  50 mg Oral Daily   [START ON 09/22/2022] metoprolol succinate  50 mg Oral Daily   [START ON 09/22/2022] spironolactone  25 mg Oral Daily   tamsulosin  0.4 mg Oral QPC supper   Continuous Infusions:  furosemide (LASIX) 200 mg in dextrose 5 % 100 mL (2 mg/mL) infusion 4 mg/hr (09/21/22 1039)   PRN Meds: acetaminophen **OR** acetaminophen, hydrALAZINE **OR** hydrALAZINE, ondansetron **OR** ondansetron (ZOFRAN) IV  Time spent: 35 minutes  Author: Val Riles. MD [facility] 09/21/2022 2:44 PM  To reach On-call, see care teams to locate the attending and reach out to them via [email or web address]. If 7PM-7AM, please contact night-coverage If you still have difficulty reaching the attending provider, please page the [facility] (Director on Call) for [facility] on amion for assistance.

## 2022-09-22 ENCOUNTER — Other Ambulatory Visit: Payer: Self-pay

## 2022-09-22 ENCOUNTER — Other Ambulatory Visit (HOSPITAL_COMMUNITY): Payer: Self-pay

## 2022-09-22 DIAGNOSIS — I161 Hypertensive emergency: Secondary | ICD-10-CM | POA: Diagnosis not present

## 2022-09-22 LAB — BASIC METABOLIC PANEL
Anion gap: 11 (ref 5–15)
BUN: 28 mg/dL — ABNORMAL HIGH (ref 8–23)
CO2: 32 mmol/L (ref 22–32)
Calcium: 9 mg/dL (ref 8.9–10.3)
Chloride: 95 mmol/L — ABNORMAL LOW (ref 98–111)
Creatinine, Ser: 1.46 mg/dL — ABNORMAL HIGH (ref 0.61–1.24)
GFR, Estimated: 54 mL/min — ABNORMAL LOW (ref >60)
Glucose, Bld: 98 mg/dL (ref 70–99)
Potassium: 3.3 mmol/L — ABNORMAL LOW (ref 3.5–5.1)
Sodium: 138 mmol/L (ref 135–145)

## 2022-09-22 LAB — ECHOCARDIOGRAM COMPLETE
AR max vel: 2.81 cm2
AV Area VTI: 2.52 cm2
AV Area mean vel: 2.58 cm2
AV Mean grad: 4 mmHg
AV Peak grad: 8.1 mmHg
Ao pk vel: 1.42 m/s
Area-P 1/2: 4.99 cm2
Height: 66 in
MV VTI: 3.17 cm2
S' Lateral: 3.3 cm
Weight: 2864.22 oz

## 2022-09-22 LAB — GLUCOSE, CAPILLARY
Glucose-Capillary: 91 mg/dL (ref 70–99)
Glucose-Capillary: 224 mg/dL — ABNORMAL HIGH (ref 70–99)
Glucose-Capillary: 242 mg/dL — ABNORMAL HIGH (ref 70–99)
Glucose-Capillary: 95 mg/dL (ref 70–99)

## 2022-09-22 LAB — CBC
HCT: 37.3 % — ABNORMAL LOW (ref 39.0–52.0)
Hemoglobin: 12.7 g/dL — ABNORMAL LOW (ref 13.0–17.0)
MCH: 31.5 pg (ref 26.0–34.0)
MCHC: 34 g/dL (ref 30.0–36.0)
MCV: 92.6 fL (ref 80.0–100.0)
Platelets: 237 10*3/uL (ref 150–400)
RBC: 4.03 MIL/uL — ABNORMAL LOW (ref 4.22–5.81)
RDW: 13.1 % (ref 11.5–15.5)
WBC: 8.4 10*3/uL (ref 4.0–10.5)
nRBC: 0 % (ref 0.0–0.2)

## 2022-09-22 LAB — MAGNESIUM: Magnesium: 1.8 mg/dL (ref 1.7–2.4)

## 2022-09-22 LAB — PHOSPHORUS: Phosphorus: 4.4 mg/dL (ref 2.5–4.6)

## 2022-09-22 MED ORDER — POTASSIUM CHLORIDE CRYS ER 20 MEQ PO TBCR
60.0000 meq | EXTENDED_RELEASE_TABLET | Freq: Once | ORAL | Status: AC
  Administered 2022-09-22: 60 meq via ORAL
  Filled 2022-09-22: qty 3

## 2022-09-22 MED ORDER — VITAMIN D (ERGOCALCIFEROL) 1.25 MG (50000 UNIT) PO CAPS
50000.0000 [IU] | ORAL_CAPSULE | ORAL | Status: DC
  Administered 2022-09-22: 50000 [IU] via ORAL
  Filled 2022-09-22: qty 1

## 2022-09-22 MED ORDER — INSULIN ASPART PROT & ASPART (70-30 MIX) 100 UNIT/ML ~~LOC~~ SUSP
12.0000 [IU] | Freq: Two times a day (BID) | SUBCUTANEOUS | Status: DC
  Administered 2022-09-22 – 2022-09-23 (×2): 12 [IU] via SUBCUTANEOUS
  Filled 2022-09-22: qty 10

## 2022-09-22 MED ORDER — FUROSEMIDE 40 MG PO TABS
60.0000 mg | ORAL_TABLET | Freq: Every day | ORAL | Status: DC
  Administered 2022-09-22 – 2022-09-23 (×2): 60 mg via ORAL
  Filled 2022-09-22 (×2): qty 1

## 2022-09-22 MED ORDER — CHLORHEXIDINE GLUCONATE CLOTH 2 % EX PADS
6.0000 | MEDICATED_PAD | Freq: Every day | CUTANEOUS | Status: DC
  Administered 2022-09-22: 6 via TOPICAL

## 2022-09-22 NOTE — Consult Note (Signed)
Reason for Consult: Left hallux blister and fracture Referring Physician: Dr. Damita Dunnings, MD  Chad Every Rodas Sr. is an 62 y.o. male.  HPI: He recently noticed a blood blister forming on his left great toe.  He was concerned about this due to his family history of diabetes, his father lost his toe after developing a wound from a blood blister.  He said that his A1c is doing pretty good.  Does not recall any traumatic event that caused this  Past Medical History:  Diagnosis Date   Diabetes mellitus without complication (HCC)    High cholesterol    Hypertension    Stomach ulcer     Past Surgical History:  Procedure Laterality Date   COLONOSCOPY     FEMUR CLOSED REDUCTION      Family History  Problem Relation Age of Onset   Diabetes Father     Social History:  reports that he has been smoking cigars and cigarettes. He started smoking about 27 years ago. He has never used smokeless tobacco. He reports that he does not drink alcohol and does not use drugs.  Allergies: No Known Allergies  Medications: I have reviewed the patient's current medications.  Results for orders placed or performed during the hospital encounter of 09/20/22 (from the past 48 hour(s))  Glucose, capillary     Status: Abnormal   Collection Time: 09/20/22  8:05 AM  Result Value Ref Range   Glucose-Capillary 130 (H) 70 - 99 mg/dL    Comment: Glucose reference range applies only to samples taken after fasting for at least 8 hours.  Glucose, capillary     Status: Abnormal   Collection Time: 09/20/22 12:15 PM  Result Value Ref Range   Glucose-Capillary 55 (L) 70 - 99 mg/dL    Comment: Glucose reference range applies only to samples taken after fasting for at least 8 hours.  Glucose, capillary     Status: Abnormal   Collection Time: 09/20/22 12:42 PM  Result Value Ref Range   Glucose-Capillary 63 (L) 70 - 99 mg/dL    Comment: Glucose reference range applies only to samples taken after fasting for at least 8  hours.  Glucose, capillary     Status: None   Collection Time: 09/20/22  1:08 PM  Result Value Ref Range   Glucose-Capillary 86 70 - 99 mg/dL    Comment: Glucose reference range applies only to samples taken after fasting for at least 8 hours.  Glucose, capillary     Status: Abnormal   Collection Time: 09/20/22  5:00 PM  Result Value Ref Range   Glucose-Capillary 133 (H) 70 - 99 mg/dL    Comment: Glucose reference range applies only to samples taken after fasting for at least 8 hours.  Glucose, capillary     Status: Abnormal   Collection Time: 09/20/22  9:58 PM  Result Value Ref Range   Glucose-Capillary 197 (H) 70 - 99 mg/dL    Comment: Glucose reference range applies only to samples taken after fasting for at least 8 hours.  Basic metabolic panel     Status: Abnormal   Collection Time: 09/21/22  5:52 AM  Result Value Ref Range   Sodium 138 135 - 145 mmol/L   Potassium 3.3 (L) 3.5 - 5.1 mmol/L   Chloride 97 (L) 98 - 111 mmol/L   CO2 31 22 - 32 mmol/L   Glucose, Bld 126 (H) 70 - 99 mg/dL    Comment: Glucose reference range applies only to samples  taken after fasting for at least 8 hours.   BUN 22 8 - 23 mg/dL   Creatinine, Ser 1.33 (H) 0.61 - 1.24 mg/dL   Calcium 8.8 (L) 8.9 - 10.3 mg/dL   GFR, Estimated >60 >60 mL/min    Comment: (NOTE) Calculated using the CKD-EPI Creatinine Equation (2021)    Anion gap 10 5 - 15    Comment: Performed at [facility], [address] [place], [place] [address]  CBC     Status: Abnormal   Collection Time: 09/21/22  5:52 AM  Result Value Ref Range   WBC 9.0 4.0 - 10.5 K/uL   RBC 4.01 (L) 4.22 - 5.81 MIL/uL   Hemoglobin 12.5 (L) 13.0 - 17.0 g/dL   HCT 36.7 (L) 39.0 - 52.0 %   MCV 91.5 80.0 - 100.0 fL   MCH 31.2 26.0 - 34.0 pg   MCHC 34.1 30.0 - 36.0 g/dL   RDW 13.3 11.5 - 15.5 %   Platelets 220 150 - 400 K/uL   nRBC 0.0 0.0 - 0.2 %    Comment: Performed at [facility], [address], [place], [place] [address]   Magnesium     Status: None   Collection Time: 09/21/22  5:52 AM  Result Value Ref Range   Magnesium 1.7 1.7 - 2.4 mg/dL    Comment: Performed at [facility], [address], [place], [place] [address]  Phosphorus     Status: None   Collection Time: 09/21/22  5:52 AM  Result Value Ref Range   Phosphorus 4.2 2.5 - 4.6 mg/dL    Comment: Performed at [facility], [address] [place], [place] [address]  Glucose, capillary     Status: Abnormal   Collection Time: 09/21/22  7:33 AM  Result Value Ref Range   Glucose-Capillary 127 (H) 70 - 99 mg/dL    Comment: Glucose reference range applies only to samples taken after fasting for at least 8 hours.  Glucose, capillary     Status: Abnormal   Collection Time: 09/21/22 11:40 AM  Result Value Ref Range   Glucose-Capillary 46 (L) 70 - 99 mg/dL    Comment: Glucose reference range applies only to samples taken after fasting for at least 8 hours.  Glucose, capillary     Status: Abnormal   Collection Time: 09/21/22  3:16 PM  Result Value Ref Range   Glucose-Capillary 103 (H) 70 - 99 mg/dL    Comment: Glucose reference range applies only to samples taken after fasting for at least 8 hours.  VITAMIN D 25 Hydroxy (Vit-D Deficiency, Fractures)     Status: Abnormal   Collection Time: 09/21/22  3:57 PM  Result Value Ref Range   Vit D, 25-Hydroxy 6.40 (L) 30 - 100 ng/mL    Comment: (NOTE) Vitamin D deficiency has been defined by the [place] practice guideline as a level of serum 25-OH  vitamin D less than 20 ng/mL (1,2). The Endocrine Society went on to  further define vitamin D insufficiency as a level between 21 and 29  ng/mL (2).  1. IOM (Institute of Medicine). 2010. Dietary reference intakes for  calcium and D. [place]: The [employer]. 2. Holick MF, Binkley Momence, Bischoff-Ferrari HA, et al. Evaluation,  treatment, and prevention of vitamin D deficiency: an  Endocrine  Society clinical practice guideline, JCEM. 2011 Jul; 96(7): 1911-30.  Performed at [facility], [address] [place],  [place] [address]   Glucose, capillary     Status: Abnormal   Collection Time: 09/21/22  9:00 PM  Result Value Ref Range   Glucose-Capillary 55 (L) 70 - 99 mg/dL    Comment: Glucose reference range applies only to samples taken after fasting for at least 8 hours.  Glucose, capillary     Status: Abnormal   Collection Time: 09/21/22  9:50 PM  Result Value Ref Range   Glucose-Capillary 153 (H) 70 - 99 mg/dL    Comment: Glucose reference range applies only to samples taken after fasting for at least 8 hours.  Basic metabolic panel     Status: Abnormal   Collection Time: 09/22/22  4:14 AM  Result Value Ref Range   Sodium 138 135 - 145 mmol/L   Potassium 3.3 (L) 3.5 - 5.1 mmol/L   Chloride 95 (L) 98 - 111 mmol/L   CO2 32 22 - 32 mmol/L   Glucose, Bld 98 70 - 99 mg/dL    Comment: Glucose reference range applies only to samples taken after fasting for at least 8 hours.   BUN 28 (H) 8 - 23 mg/dL   Creatinine, Ser 1.46 (H) 0.61 - 1.24 mg/dL   Calcium 9.0 8.9 - 10.3 mg/dL   GFR, Estimated 54 (L) >60 mL/min    Comment: (NOTE) Calculated using the CKD-EPI Creatinine Equation (2021)    Anion gap 11 5 - 15    Comment: Performed at [facility], [address] [place], [place] [address]  CBC     Status: Abnormal   Collection Time: 09/22/22  4:14 AM  Result Value Ref Range   WBC 8.4 4.0 - 10.5 K/uL   RBC 4.03 (L) 4.22 - 5.81 MIL/uL   Hemoglobin 12.7 (L) 13.0 - 17.0 g/dL   HCT 37.3 (L) 39.0 - 52.0 %   MCV 92.6 80.0 - 100.0 fL   MCH 31.5 26.0 - 34.0 pg   MCHC 34.0 30.0 - 36.0 g/dL   RDW 13.1 11.5 - 15.5 %   Platelets 237 150 - 400 K/uL   nRBC 0.0 0.0 - 0.2 %    Comment: Performed at [facility], [address], [place], [place] [address]  Magnesium     Status: None   Collection Time: 09/22/22  4:14 AM  Result Value Ref Range    Magnesium 1.8 1.7 - 2.4 mg/dL    Comment: Performed at [facility], [address], [place], [place] [address]  Phosphorus     Status: None   Collection Time: 09/22/22  4:14 AM  Result Value Ref Range   Phosphorus 4.4 2.5 - 4.6 mg/dL    Comment: Performed at [facility], [address] [place], [place] [address]    No results found.  Review of Systems  Constitutional:  Negative for chills and fever.  Musculoskeletal:  Negative for joint pain.  Skin:        Blood blister left hallux  All other systems reviewed and are negative.  Blood pressure (!) 158/85, pulse 91, temperature 98.2 F (36.8 C), temperature source Oral, resp. rate 18, height 5\' 6"  (1.676 m), weight 81.2 kg, SpO2 96 %.  Vitals:   09/22/22 0029 09/22/22 0341  BP: (!) 144/77 (!) 158/85  Pulse: 94 91  Resp: 18 18  Temp: 98.6 F (37 C) 98.2 F (36.8 C)  SpO2: 97% 96%    General AA&O x3. Normal mood and affect.  Vascular Foot is warm and well-perfused, pedal  pulses palpable.  Neurologic Epicritic sensation grossly reduced.  Dermatologic (Wound) Small well-organized blood blister on medial hallux noted, no ulceration or signs of infection Drainage or erythema  Orthopedic: Motor intact BLE.  Full smooth range of motion of IPJ and MTPJ.  No edema in toe.  No pain to palpation    Assessment/Plan:  Left hallux fracture, blood blister -Imaging: Studies independently reviewed.  Clinically does not have any pain or functional limitations from hallux fracture and there is no outward edema or ecchymosis at the site.  If it is truly fractured, his neuropathy has left hip pain free.  He may continue to be WBAT in regular shoe gear -There is no ulceration may leave blood blister open to air.  Did not see  Indication for antibiotics or further imaging - podiatry will sign off.  Please consult again if needed [place] available via secure chat for questions or concerns.

## 2022-09-22 NOTE — Progress Notes (Signed)
[facility] NOTE       Patient ID: Shandy Porres Sr. MRN: [id number] DOB/AGE: [birth year] 61 y.o.  Admit date: 09/20/2022 Referring Physician Dr. Judd Gaudier  Primary Physician Dr. Tressia Miners  Primary Cardiologist Dr. Clayborn Bigness Reason for Consultation CHF  HPI: Chad Pica Sr. Is a 62yoM with a PMH of HFpEF (50-55%, G1DD, mild - mod aortic sclerosis 07/2020), nonobstructive CAD by cCTA 12/2020, HTN, DM2 (7.1%), who presented to [facility] ED 09/20/2022 with swollen genitals and a sore on his great toe after a trip to [place]. He was also hypoxic to 84% on room air & hypertensive to 202/123 on admission. Admits to not taking his BP medications or insulin for several months. Feeling better after initial IV diuresis.   Interval History: - didn't sleep well last night, breathing much better and no chest pain. Continues to diurese briskly, though Cr increasing.  - on room air, SPO2 96%  Review of systems complete and found to be negative unless listed above     Past Medical History:  Diagnosis Date   Diabetes mellitus without complication (HCC)    High cholesterol    Hypertension    Stomach ulcer     Past Surgical History:  Procedure Laterality Date   COLONOSCOPY     FEMUR CLOSED REDUCTION      Medications Prior to Admission  Medication Sig Dispense Refill Last Dose   glipiZIDE (GLUCOTROL XL) 5 MG 24 hr tablet Take 5 mg by mouth daily with breakfast.   Past Month   insulin NPH-regular Human (70-30) 100 UNIT/ML injection Inject 15 Units into the skin 2 (two) times daily with a meal.   09/17/2022 at unknown   losartan-hydrochlorothiazide (HYZAAR) 50-12.5 MG tablet Take 1 tablet by mouth daily.   Past Month   rosuvastatin (CRESTOR) 5 MG tablet Take 5 mg by mouth daily.   Past Month   aspirin 81 MG EC tablet Take 81 mg by mouth daily. (Patient not taking: Reported on 09/20/2022)   Not Taking   glipiZIDE-metformin (METAGLIP) 5-500 MG tablet Take 1 tablet by mouth 2 (two)  times daily. (Patient not taking: Reported on 09/20/2022)   Not Taking   isosorbide mononitrate (IMDUR) 30 MG 24 hr tablet Take 30 mg by mouth daily. (Patient not taking: Reported on 09/20/2022)   Not Taking   losartan (COZAAR) 25 MG tablet Take 50 mg by mouth daily. (Patient not taking: Reported on 09/08/2021)   Not Taking   metoprolol tartrate (LOPRESSOR) 25 MG tablet Take 12.5 mg by mouth 2 (two) times daily. (Patient not taking: Reported on 09/08/2021)   Not Taking   pregabalin (LYRICA) 25 MG capsule Take 25 mg by mouth in the morning and at bedtime. (Patient not taking: Reported on 09/08/2021)      simvastatin (ZOCOR) 40 MG tablet Take 40 mg by mouth at bedtime. (Patient not taking: Reported on 09/08/2021)   Not Taking   Vitamin D, Ergocalciferol, (DRISDOL) 1.25 MG (50000 UNIT) CAPS capsule Take 1 capsule by mouth once a week. (Patient not taking: Reported on 09/08/2021)   Not Taking   Social History   Socioeconomic History   Marital status: Single    Spouse name: Not on file   Number of children: Not on file   Years of education: Not on file   Highest education level: Not on file  Occupational History   Not on file  Tobacco Use   Smoking status: Every Day    Packs/day: 0  Types: Cigars, Cigarettes    Start date: 07/07/1995   Smokeless tobacco: Never   Tobacco comments:    2-3 cigars/day  Substance and Sexual Activity   Alcohol use: No   Drug use: No   Sexual activity: Yes  Other Topics Concern   Not on file  Social History Narrative   Not on file   Social Determinants of Health   Financial Resource Strain: Not on file  Food Insecurity: Not on file  Transportation Needs: Not on file  Physical Activity: Not on file  Stress: Not on file  Social Connections: Not on file  Intimate Partner Violence: Not on file    Family History  Problem Relation Age of Onset   Diabetes Father       Intake/Output Summary (Last 24 hours) at 09/22/2022 1207 Last data filed at 09/22/2022  1107 Gross per 24 hour  Intake 580 ml  Output 6750 ml  Net -6170 ml     Vitals:   09/22/22 0029 09/22/22 0341 09/22/22 0816 09/22/22 0840  BP: (!) 144/77 (!) 158/85 (!) 150/72   Pulse: 94 91 87   Resp: 18 18 19    Temp: 98.6 F (37 C) 98.2 F (36.8 C) 98.4 F (36.9 C)   TempSrc: Oral Oral    SpO2: 97% 96% 94%   Weight:    76.5 kg  Height:        PHYSICAL EXAM General: middle aged black male , well nourished, in no acute distress. HEENT:  Normocephalic and atraumatic. Neck:  No JVD.  Lungs: Normal respiratory effort on room air. Trace crackles right base Heart: HRRR . Normal S1 and S2 without gallops or murmurs.  Abdomen: Non-distended appearing.  Msk: Normal strength and tone for age. Extremities: Warm and well perfused. No clubbing, cyanosis. Trace bilateral peripheral edema, improved from yesterday.  Neuro: Alert and oriented X 3. Psych:  Answers questions appropriately.   Labs: Basic Metabolic Panel: Recent Labs    09/21/22 0552 09/22/22 0414  NA 138 138  K 3.3* 3.3*  CL 97* 95*  CO2 31 32  GLUCOSE 126* 98  BUN 22 28*  CREATININE 1.33* 1.46*  CALCIUM 8.8* 9.0  MG 1.7 1.8  PHOS 4.2 4.4    Liver Function Tests: No results for input(s): "AST", "ALT", "ALKPHOS", "BILITOT", "PROT", "ALBUMIN" in the last 72 hours. No results for input(s): "LIPASE", "AMYLASE" in the last 72 hours. CBC: Recent Labs    09/21/22 0552 09/22/22 0414  WBC 9.0 8.4  HGB 12.5* 12.7*  HCT 36.7* 37.3*  MCV 91.5 92.6  PLT 220 237    Cardiac Enzymes: Recent Labs    09/20/22 0109 09/20/22 0313  TROPONINIHS 37* 39*    BNP: Recent Labs    09/20/22 0110  BNP 1,778.7*    D-Dimer: No results for input(s): "DDIMER" in the last 72 hours. Hemoglobin A1C: Recent Labs    09/20/22 0600  HGBA1C 7.1*   Fasting Lipid Panel: No results for input(s): "CHOL", "HDL", "LDLCALC", "TRIG", "CHOLHDL", "LDLDIRECT" in the last 72 hours. Thyroid Function Tests: No results for  input(s): "TSH", "T4TOTAL", "T3FREE", "THYROIDAB" in the last 72 hours.  Invalid input(s): "FREET3" Anemia Panel: Recent Labs    09/20/22 0600  VITAMINB12 425  FOLATE 12.1  FERRITIN 128  TIBC 333  IRON 48  RETICCTPCT 2.3      Radiology: US SCROTUM W/DOPPLER  Result Date: 09/20/2022 CLINICAL DATA:  Scrotal swell EXAM: SCROTAL ULTRASOUND DOPPLER ULTRASOUND OF THE TESTICLES TECHNIQUE: Complete ultrasound examination  of the testicles, epididymis, and other scrotal structures was performed. Color and spectral Doppler ultrasound were also utilized to evaluate blood flow to the testicles. COMPARISON:  None Available. FINDINGS: Right testicle Measurements: 4.2 x 2.9 x 2.7 cm. No mass or microlithiasis visualized. Left testicle Measurements: 4.1 x 2.9 x 2.5 cm. No mass or microlithiasis visualized. Right epididymis:  Normal in size and appearance. Left epididymis:  Normal in size and appearance. Hydrocele:  None visualized. Varicocele:  None visualized. Pulsed Doppler interrogation of both testes demonstrates normal low resistance arterial and venous waveforms bilaterally. Other: Marked bilateral scrotal wall thickening, 4.9 cm on the right and 3.9 cm on the left. IMPRESSION: Scrotal wall thickening bilaterally. No testicular abnormality. Electronically Signed   By: Rolm Baptise M.D.   On: 09/20/2022 02:24   DG Toe Great Left  Result Date: 09/20/2022 CLINICAL DATA:  Left great toe hematoma. EXAM: LEFT GREAT TOE COMPARISON:  None Available. FINDINGS: A nondisplaced fracture deformity is seen involving the medial aspect of the base of the distal phalanx of the left great toe. There is no evidence of dislocation. There is no evidence of arthropathy or other focal bone abnormality. There is mild diffuse soft tissue swelling. IMPRESSION: Nondisplaced fracture of the distal phalanx of the left great toe. Electronically Signed   By: Virgina Norfolk M.D.   On: 09/20/2022 01:40   DG Chest 2 View  Result  Date: 09/20/2022 CLINICAL DATA:  Chest pain and shortness of breath. EXAM: CHEST - 2 VIEW COMPARISON:  April 03, 2021 FINDINGS: The heart size and mediastinal contours are within normal limits. Low lung volumes are seen. Marked severity airspace disease is noted within the right lung base with mild to moderate severity linear atelectasis seen within the mid and lower left lung. There is a small to moderate sized right-sided pleural effusion. A very small left pleural effusion is also suspected. No pneumothorax is identified. The visualized skeletal structures are unremarkable. IMPRESSION: 1. Low lung volumes with marked severity right basilar airspace disease. 2. Mild to moderate severity linear atelectasis within the mid and lower left lung. 3. Bilateral pleural effusions, right greater than left. Electronically Signed   By: Virgina Norfolk M.D.   On: 09/20/2022 01:38    ECHO 07/17/2020  1. Left ventricular ejection fraction, by estimation, is 50 to 55%. The  left ventricle has low normal function. The left ventricle has no regional  wall motion abnormalities. There is mild left ventricular hypertrophy.  Left ventricular diastolic  parameters are consistent with Grade I diastolic dysfunction (impaired  relaxation).   2. Right ventricular systolic function was not well visualized. The right  ventricular size is normal.   3. The mitral valve is normal in structure. No evidence of mitral valve  regurgitation.   4. The aortic valve is tricuspid. Aortic valve regurgitation is not  visualized. Mild to moderate aortic valve sclerosis/calcification is  present, without any evidence of aortic stenosis.    Coronary CTA 12/12/2020  IMPRESSION: 1. Coronary calcium score of 9.05. This was 63rd percentile for age and sex matched control.   2. Normal coronary origin with right dominance.   3. Mild calcified plaque in the proximal RCA causing minimal stenosis (<25%).   4. CAD-RADS 1. Minimal  non-obstructive CAD (0-24%). Consider non-atherosclerotic causes of chest pain. Consider preventive therapy and risk factor modification.   Electronically Signed: By: Kate Sable M.D. On: 12/12/2020 12:56  TELEMETRY reviewed by me (LT) 09/22/2022 : NSR 80s-90s  EKG reviewed  by me: NSR with PVC PACs and nonspecific TW abnormality   Data reviewed by me (LT) 09/22/2022: hospitalist progress note, nursing notes, last 24h vitals tele labs imaging I/O    Principal Problem:   Hypertensive emergency Active Problems:   Diabetes mellitus, type II ([place])   Elevated troponin   Bilateral pleural effusion   Anasarca   Fracture of distal phalanx of left great toe   Scrotal edema   Acute respiratory failure with hypoxia (HCC)   Anemia   Acute on chronic diastolic CHF (congestive heart failure) ([place])   Coronary artery disease    ASSESSMENT AND PLAN:  Chad Pica Sr. Is a 83yoM with a PMH of HFpEF (50-55%, G1DD, mild - mod aortic sclerosis 07/2020), nonobstructive CAD by cCTA 12/2020, HTN, DM2 (7.1%), who presented to [facility] ED 09/20/2022 with swollen genitals and a sore on his great toe after a trip to [place]. He was also hypoxic to 84% on room air & hypertensive to 202/123 on admission. Admits to not taking his BP medications or insulin for several months. Clinical improvement after initial IV diuresis.   # hypertensive urgency  In the setting of medication noncompliance. Ongoing titration of BP meds & GDMT as below  # acute on chronic HFpEF (50-55%)  Hypervolemic with peripheral edema and groin swelling, BNP elevated at 1700, clinical improvement with initial diuresis - continue GDMT with metoprolol XL 50mg  daily, losartan 50mg  daily, spiro 25mg  daily, imdur 30 daily. No SGLT2i with scrotal edema/wall thicking on U/S - stop lasix gtt, start lasix 60mg  PO daily today - monitor BMP daily with diuresis - echo complete pending read - anticipate discharge readiness from a cardiac perspective  tomorrow AM   # demand ischemia # nonobstructive CAD by cCTA Chest pain free, EKG nonacute. HS trop minimal elevation and insignificant delta at 37-39, most c/w demand ischemia and not ACS.  - continue aspirin 81mg  daily and atorva 40mg  daily   # tobacco abuse Strongly encouraged complete cessation   This patient's plan of care was discussed and created with Dr. Saralyn Pilar and he is in agreement.  Signed: Tristan Schroeder , PA-C 09/22/2022, 12:07 PM [facility]

## 2022-09-22 NOTE — Progress Notes (Signed)
[facility] Progress Note  Patient: Chad Good    [id number]  DOA: 09/20/2022     Date of Service: the patient was seen and examined on 09/22/2022  Chief Complaint  Patient presents with   Chest Pain   Groin Swelling   Brief hospital course: Chad Clinkscales Sr. is a 62 y.o. 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   ED workup: O2 sat 84% on room air and blood pressure 202/123.  Troponin 39 and BNP 1778  EKG, personally viewed and interpreted shows sinus at 90 with PVCs and nonspecific ST T wave changes. Chest x-ray showed bilateral pleural effusions right greater than left Scrotal ultrasound showed scrotal wall thickening without testicular abnormality X-ray left great toe showed a nondisplaced fracture of the distal phalanx of the left great toe Patient treated with Lasix 80 mg IV and Nitropaste.  Hospitalist consulted for admission.   Assessment and Plan: Acute on chronic diastolic CHF (congestive heart failure) exacerbation Hypertensive emergency Bilateral pleural effusions Acute respiratory failure with hypoxia Medication nonadherence Patient presents with scrotal edema, bilateral pleural effusion on chest x-ray, BNP over 1700 Echo from 2022 showing grade 1 diastolic dysfunction S/p IV Lasix, 3/17 s/p Lasix IV infusion 8 mg/hr 3/18 decrease the rate of IV Lasix infusion 4 mg/h due to slightly elevated creatinine 3/19 DC'd Lasix IV infusion, started Lasix 60 mg p.o. daily Continue metoprolol, losartan, Imdur Supplemental oxygen to keep sats over 94% Daily weights with intake and output monitoring Echocardiogram pending     # Elevated troponin # Nonobstructive CAD Patient denies chest pain, EKG nonacute Had an abnormal nuclear stress test in 2022 and a coronary calcium score of 9 Elevated troponin of 39 likely secondary to demand ischemia Follow-up echocardiogram to evaluate for wall motion abnormality Resume metoprolol, simvastatin, losartan and aspirin Cardiology consulted   # Hypokalemia due to diuresis, potassium repleted. Monitor electrolytes and replete as needed.   # Scrotal edema and Anasarca, due to diastolic CHF Scrotal ultrasound showing no testicular abnormality Expecting improvement with diuretic regimen as described above under CHF   # Diabetes mellitus, type II, HbA1c 7.1, well-controlled Basal insulin with sliding scale coverage Patient was recently seen by a nutritionist    # Anemia Hemoglobin 12.7, no recent baseline Anemia panel, transferrin saturation 14%, slightly low, folate and B12 within normal range.  Follow with PCP for further management as an outpatient     # Fracture of distal phalanx of left great toe Podiatry consult, Rec no intervention, weightbearing as tolerated.  Continue prn meds for pain control  # Urinary retention, Foley catheter was inserted on 3/17 Started Flomax 3/19 DC Foley catheter and follow voiding trial Bladder scan as needed  # Vitamin D deficiency: started vitamin D 50,000 units p.o. weekly, follow with PCP to repeat vitamin D level after 3 to 6 months.  # Nicotine dependence, patient smokes cigarettes.  Smoking cessation counseling done.  Body mass index is 28.89 kg/m.   Interventions:       Diet: Heart healthy/carb modified DVT Prophylaxis: Subcutaneous Lovenox   Advance goals of care discussion: Full code  Family Communication: family was not present  at bedside, at the time of interview.  The pt provided permission to discuss medical plan with the family. Opportunity was given to ask question and all questions were answered  satisfactorily.   Disposition:  Pt is from Home, admitted with anasarca, s/p IV Lasix infusion, transition to oral Lasix, edema is improving.  Discontinue Foley catheter today, follow voiding trial.   Discharge to home, most likely tomorrow a.m.   Subjective: No significant overnight events, respiratory failure resolved, patient saturating well on room air, edema is improving.  Denies chest pain or palpitation, no shortness of breath.   Physical Exam: General: NAD, lying comfortably Appear in no distress, affect appropriate Eyes: PERRLA ENT: Oral Mucosa Clear, moist  Neck: no JVD,  Cardiovascular: S1 and S2 Present, no Murmur,  Respiratory: good respiratory effort, Bilateral Air entry equal and Decreased, no Crackles, no wheezes Abdomen: Bowel Sound present, Soft and no tenderness, scrotal edema improving Skin: no rashes Extremities: 2+ Pedal edema, no calf tenderness.  Edema gradually improving Neurologic: without any new focal findings Gait not checked due to patient safety concerns  Vitals:   09/22/22 0341 09/22/22 0816 09/22/22 0840 09/22/22 1228  BP: (!) 158/85 (!) 150/72  136/62  Pulse: 91 87  88  Resp: 18 19  16   Temp: 98.2 F (36.8 C) 98.4 F (36.9 C)  98 F (36.7 C)  TempSrc: Oral     SpO2: 96% 94%  95%  Weight:   76.5 kg   Height:        Intake/Output Summary (Last 24 hours) at 09/22/2022 1359 Last data filed at 09/22/2022 1107 Gross per 24 hour  Intake 580 ml  Output 6750 ml  Net -6170 ml   Filed Weights   09/20/22 0856 09/21/22 0358 09/22/22 0840  Weight: 83.9 kg 81.2 kg 76.5 kg    Data Reviewed: I have personally reviewed and interpreted daily labs, tele strips, imagings as discussed above. I reviewed all nursing notes, pharmacy notes, vitals, pertinent old records I have discussed plan of care as described above with RN and patient/family.  CBC: Recent Labs  Lab 09/20/22 0109 09/21/22 0552 09/22/22 0414  WBC 7.5 9.0 8.4  HGB 12.7* 12.5* 12.7*   HCT 38.3* 36.7* 37.3*  MCV 94.3 91.5 92.6  PLT 213 220 [id number]   Basic Metabolic Panel: Recent Labs  Lab 09/20/22 0109 09/20/22 0556 09/21/22 0552 09/22/22 0414  NA 134*  --  138 138  K 3.6  --  3.3* 3.3*  CL 103  --  97* 95*  CO2 27  --  31 32  GLUCOSE 168*  --  126* 98  BUN 17  --  22 28*  CREATININE 1.20  --  1.33* 1.46*  CALCIUM 8.0*  --  8.8* 9.0  MG  --  1.9 1.7 1.8  PHOS  --  3.3 4.2 4.4    Studies: ECHOCARDIOGRAM COMPLETE  Result Date: 09/22/2022    ECHOCARDIOGRAM REPORT   Patient Name:   Daelynn Rodd Sr. Date of Exam: 09/21/2022 Medical Rec #:  [id number]            Height:       66.0 in Accession #:    [id number]           Weight:       179.0 lb Date of Birth:  23-Aug-1960             BSA:          1.908 m Patient Age:  62 years             BP:           148/91 mmHg Patient Gender: M                    HR:           88 bpm. Exam Location:  [facility] Procedure: 2D Echo, Cardiac Doppler and Color Doppler Indications:     CHF  History:         Patient has prior history of Echocardiogram examinations, most                  recent 07/17/2020. CHF, CAD; Risk Factors:Hypertension and                  Diabetes.  Sonographer:     Wenda Low Referring Phys:  [id number] Athena Masse Diagnosing Phys: Isaias Cowman MD IMPRESSIONS  1. Left ventricular ejection fraction, by estimation, is 55 to 60%. The left ventricle has normal function. The left ventricle has no regional wall motion abnormalities. Left ventricular diastolic parameters were normal.  2. Right ventricular systolic function is normal. The right ventricular size is normal. There is normal pulmonary artery systolic pressure.  3. The mitral valve is normal in structure. Mild mitral valve regurgitation. No evidence of mitral stenosis.  4. The aortic valve is normal in structure. Aortic valve regurgitation is trivial. No aortic stenosis is present.  5. The inferior vena cava is normal in size with greater than 50% respiratory  variability, suggesting right atrial pressure of 3 mmHg. FINDINGS  Left Ventricle: Left ventricular ejection fraction, by estimation, is 55 to 60%. The left ventricle has normal function. The left ventricle has no regional wall motion abnormalities. The left ventricular internal cavity size was normal in size. There is  no left ventricular hypertrophy. Left ventricular diastolic parameters were normal. Right Ventricle: The right ventricular size is normal. No increase in right ventricular wall thickness. Right ventricular systolic function is normal. There is normal pulmonary artery systolic pressure. The tricuspid regurgitant velocity is 2.63 m/s, and  with an assumed right atrial pressure of 3 mmHg, the estimated right ventricular systolic pressure is 99991111 mmHg. Left Atrium: Left atrial size was normal in size. Right Atrium: Right atrial size was normal in size. Pericardium: There is no evidence of pericardial effusion. Mitral Valve: The mitral valve is normal in structure. Mild mitral valve regurgitation. No evidence of mitral valve stenosis. MV peak gradient, 4.4 mmHg. The mean mitral valve gradient is 2.0 mmHg. Tricuspid Valve: The tricuspid valve is normal in structure. Tricuspid valve regurgitation is mild . No evidence of tricuspid stenosis. Aortic Valve: The aortic valve is normal in structure. Aortic valve regurgitation is trivial. No aortic stenosis is present. Aortic valve mean gradient measures 4.0 mmHg. Aortic valve peak gradient measures 8.1 mmHg. Aortic valve area, by VTI measures 2.52 cm. Pulmonic Valve: The pulmonic valve was normal in structure. Pulmonic valve regurgitation is not visualized. No evidence of pulmonic stenosis. Aorta: The aortic root is normal in size and structure. Venous: The inferior vena cava is normal in size with greater than 50% respiratory variability, suggesting right atrial pressure of 3 mmHg. IAS/Shunts: No atrial level shunt detected by color flow Doppler.  LEFT  VENTRICLE PLAX 2D LVIDd:         4.70 cm   Diastology LVIDs:         3.30 cm   LV e' medial:  5.77 cm/s LV PW:         1.40 cm   LV E/e' medial:  17.7 LV IVS:        1.10 cm   LV e' lateral:   6.85 cm/s LVOT diam:     2.00 cm   LV E/e' lateral: 14.9 LV SV:         76 LV SV Index:   40 LVOT Area:     3.14 cm  RIGHT VENTRICLE RV Basal diam:  4.20 cm TAPSE (M-mode): 2.1 cm LEFT ATRIUM             Index        RIGHT ATRIUM           Index LA diam:        4.50 cm 2.36 cm/m   RA Area:     18.60 cm LA Vol (A2C):   54.3 ml 28.46 ml/m  RA Volume:   58.40 ml  30.61 ml/m LA Vol (A4C):   51.8 ml 27.15 ml/m LA Biplane Vol: 58.1 ml 30.45 ml/m  AORTIC VALVE                    PULMONIC VALVE AV Area (Vmax):    2.81 cm     PV Vmax:       0.92 m/s AV Area (Vmean):   2.58 cm     PV Peak grad:  3.4 mmHg AV Area (VTI):     2.52 cm AV Vmax:           142.00 cm/s AV Vmean:          96.100 cm/s AV VTI:            0.300 m AV Peak Grad:      8.1 mmHg AV Mean Grad:      4.0 mmHg LVOT Vmax:         127.00 cm/s LVOT Vmean:        79.000 cm/s LVOT VTI:          0.241 m LVOT/AV VTI ratio: 0.80  AORTA Ao Root diam: 3.00 cm MITRAL VALVE                TRICUSPID VALVE MV Area (PHT): 4.99 cm     TR Peak grad:   27.7 mmHg MV Area VTI:   3.17 cm     TR Vmax:        263.00 cm/s MV Peak grad:  4.4 mmHg MV Mean grad:  2.0 mmHg     SHUNTS MV Vmax:       1.05 m/s     Systemic VTI:  0.24 m MV Vmean:      65.1 cm/s    Systemic Diam: 2.00 cm MV Decel Time: 152 msec MV E velocity: 102.00 cm/s MV A velocity: 80.70 cm/s MV E/A ratio:  1.26 Isaias Cowman MD Electronically signed by Isaias Cowman MD Signature Date/Time: 09/22/2022/1:13:29 PM    Final     Scheduled Meds:  aspirin EC  81 mg Oral Daily   atorvastatin  40 mg Oral Daily   Chlorhexidine Gluconate Cloth  6 each Topical Daily   enoxaparin (LOVENOX) injection  40 mg Subcutaneous Q24H   furosemide  60 mg Oral Daily   insulin aspart  0-5 Units Subcutaneous QHS   insulin aspart   0-9 Units Subcutaneous TID WC   insulin aspart protamine- aspart  12 Units Subcutaneous BID WC   isosorbide mononitrate  30 mg Oral Daily   losartan  50 mg Oral Daily   metoprolol succinate  50 mg Oral Daily   spironolactone  25 mg Oral Daily   tamsulosin  0.4 mg Oral QPC supper   Vitamin D (Ergocalciferol)  50,000 Units Oral Q7 days   Continuous Infusions:   PRN Meds: acetaminophen **OR** acetaminophen, hydrALAZINE **OR** hydrALAZINE, ondansetron **OR** ondansetron (ZOFRAN) IV  Time spent: 35 minutes  Author: Val Riles. MD [facility] 09/22/2022 1:59 PM  To reach On-call, see care teams to locate the attending and reach out to them via [email or web address]. If 7PM-7AM, please contact night-coverage If you still have difficulty reaching the attending provider, please page the [facility] (Director on Call) for [facility] on amion for assistance.

## 2022-09-23 ENCOUNTER — Other Ambulatory Visit: Payer: Self-pay

## 2022-09-23 DIAGNOSIS — I161 Hypertensive emergency: Secondary | ICD-10-CM | POA: Diagnosis not present

## 2022-09-23 LAB — CBC
HCT: 35.6 % — ABNORMAL LOW (ref 39.0–52.0)
Hemoglobin: 11.8 g/dL — ABNORMAL LOW (ref 13.0–17.0)
MCH: 31.1 pg (ref 26.0–34.0)
MCHC: 33.1 g/dL (ref 30.0–36.0)
MCV: 93.7 fL (ref 80.0–100.0)
Platelets: 246 10*3/uL (ref 150–400)
RBC: 3.8 MIL/uL — ABNORMAL LOW (ref 4.22–5.81)
RDW: 13.2 % (ref 11.5–15.5)
WBC: 9.9 10*3/uL (ref 4.0–10.5)
nRBC: 0 % (ref 0.0–0.2)

## 2022-09-23 LAB — BASIC METABOLIC PANEL
Anion gap: 5 (ref 5–15)
BUN: 33 mg/dL — ABNORMAL HIGH (ref 8–23)
CO2: 32 mmol/L (ref 22–32)
Calcium: 8.5 mg/dL — ABNORMAL LOW (ref 8.9–10.3)
Chloride: 102 mmol/L (ref 98–111)
Creatinine, Ser: 1.43 mg/dL — ABNORMAL HIGH (ref 0.61–1.24)
GFR, Estimated: 56 mL/min — ABNORMAL LOW (ref >60)
Glucose, Bld: 163 mg/dL — ABNORMAL HIGH (ref 70–99)
Potassium: 3.8 mmol/L (ref 3.5–5.1)
Sodium: 139 mmol/L (ref 135–145)

## 2022-09-23 LAB — GLUCOSE, CAPILLARY
Glucose-Capillary: 159 mg/dL — ABNORMAL HIGH (ref 70–99)
Glucose-Capillary: 181 mg/dL — ABNORMAL HIGH (ref 70–99)

## 2022-09-23 LAB — PHOSPHORUS: Phosphorus: 3.3 mg/dL (ref 2.5–4.6)

## 2022-09-23 LAB — MAGNESIUM: Magnesium: 1.9 mg/dL (ref 1.7–2.4)

## 2022-09-23 MED ORDER — SPIRONOLACTONE 25 MG PO TABS
25.0000 mg | ORAL_TABLET | Freq: Every day | ORAL | 11 refills | Status: DC
  Filled 2022-09-23: qty 30, 30d supply, fill #0

## 2022-09-23 MED ORDER — ASPIRIN 81 MG PO TBEC
81.0000 mg | DELAYED_RELEASE_TABLET | Freq: Every day | ORAL | 12 refills | Status: DC
  Filled 2022-09-23: qty 30, 30d supply, fill #0

## 2022-09-23 MED ORDER — TAMSULOSIN HCL 0.4 MG PO CAPS
0.4000 mg | ORAL_CAPSULE | Freq: Every day | ORAL | 2 refills | Status: AC
  Filled 2022-09-23: qty 30, 30d supply, fill #0

## 2022-09-23 MED ORDER — LOSARTAN POTASSIUM 25 MG PO TABS
50.0000 mg | ORAL_TABLET | Freq: Every day | ORAL | 11 refills | Status: DC
  Filled 2022-09-23: qty 60, 30d supply, fill #0

## 2022-09-23 MED ORDER — HYDRALAZINE HCL 50 MG PO TABS
50.0000 mg | ORAL_TABLET | Freq: Three times a day (TID) | ORAL | 0 refills | Status: DC | PRN
  Filled 2022-09-23: qty 90, 30d supply, fill #0

## 2022-09-23 MED ORDER — INSULIN NPH ISOPHANE & REGULAR (70-30) 100 UNIT/ML ~~LOC~~ SUSP
12.0000 [IU] | Freq: Two times a day (BID) | SUBCUTANEOUS | 11 refills | Status: DC
  Filled 2022-09-23: qty 10, 42d supply, fill #0

## 2022-09-23 MED ORDER — ISOSORBIDE MONONITRATE ER 30 MG PO TB24
30.0000 mg | ORAL_TABLET | Freq: Every day | ORAL | 11 refills | Status: DC
  Filled 2022-09-23: qty 30, 30d supply, fill #0

## 2022-09-23 MED ORDER — FUROSEMIDE 20 MG PO TABS
60.0000 mg | ORAL_TABLET | Freq: Every day | ORAL | 2 refills | Status: DC
  Filled 2022-09-23: qty 90, 30d supply, fill #0

## 2022-09-23 MED ORDER — ROSUVASTATIN CALCIUM 5 MG PO TABS
5.0000 mg | ORAL_TABLET | Freq: Every day | ORAL | 11 refills | Status: DC
  Filled 2022-09-23: qty 30, 30d supply, fill #0

## 2022-09-23 MED ORDER — METOPROLOL SUCCINATE ER 100 MG PO TB24
100.0000 mg | ORAL_TABLET | Freq: Every day | ORAL | Status: DC
  Administered 2022-09-23: 100 mg via ORAL
  Filled 2022-09-23: qty 1

## 2022-09-23 MED ORDER — VITAMIN D (ERGOCALCIFEROL) 1.25 MG (50000 UNIT) PO CAPS
50000.0000 [IU] | ORAL_CAPSULE | ORAL | 0 refills | Status: AC
  Filled 2022-09-23: qty 12, 84d supply, fill #0

## 2022-09-23 MED ORDER — METOPROLOL SUCCINATE ER 100 MG PO TB24
100.0000 mg | ORAL_TABLET | Freq: Every day | ORAL | 11 refills | Status: DC
  Filled 2022-09-23: qty 30, 30d supply, fill #0

## 2022-09-23 NOTE — Progress Notes (Signed)
Heart Failure Nurse Navigator Note    Met with patient he was lying in bed in no acute distress.  States that he is hopefully being discharged home today.  By teach back method went over how he is going to take care of himself with daily weights, sodium and fluid restriction and taking medications as ordered.  He needed very little reinforcement.  He states that he has a scale and will weigh himself daily.   He had no further questions.  Pricilla Riffle RN [facility]

## 2022-09-23 NOTE — Progress Notes (Signed)
[place] NOTE       Patient ID: Chad Hains Sr. MRN: [id number] DOB/AGE: 1960/08/11 62 y.o.  Admit date: 09/20/2022 Referring Physician Dr. Judd Gaudier  Primary Physician Dr. Tressia Miners  Primary Cardiologist Dr. Clayborn Bigness Reason for Consultation CHF  HPI: Chad Pica Sr. Is a 31yoM with a PMH of HFpEF (50-55%, G1DD, mild - mod aortic sclerosis 07/2020), nonobstructive CAD by cCTA 12/2020, HTN, DM2 (7.1%), who presented to [facility] ED 09/20/2022 with swollen genitals and a sore on his great toe after a trip to [place]. He was also hypoxic to 84% on room air & hypertensive to 202/123 on admission. Admits to not taking his BP medications or insulin for several months.   Interval History: - no acute events - diuresing briskly. Net IO Since Admission: -15,111.85 mL [09/23/22 0909]  - no chest pain, shortness of breath - eager to go home.  - echo with preserved EF, mild MR   Review of systems complete and found to be negative unless listed above     Past Medical History:  Diagnosis Date   Diabetes mellitus without complication ([place])    High cholesterol    Hypertension    Stomach ulcer     Past Surgical History:  Procedure Laterality Date   COLONOSCOPY     FEMUR CLOSED REDUCTION      Medications Prior to Admission  Medication Sig Dispense Refill Last Dose   glipiZIDE (GLUCOTROL XL) 5 MG 24 hr tablet Take 5 mg by mouth daily with breakfast.   Past Month   insulin NPH-regular Human (70-30) 100 UNIT/ML injection Inject 15 Units into the skin 2 (two) times daily with a meal.   09/17/2022 at unknown   losartan-hydrochlorothiazide (HYZAAR) 50-12.5 MG tablet Take 1 tablet by mouth daily.   Past Month   rosuvastatin (CRESTOR) 5 MG tablet Take 5 mg by mouth daily.   Past Month   aspirin 81 MG EC tablet Take 81 mg by mouth daily. (Patient not taking: Reported on 09/20/2022)   Not Taking   glipiZIDE-metformin (METAGLIP) 5-500 MG tablet Take 1 tablet by mouth 2 (two) times  daily. (Patient not taking: Reported on 09/20/2022)   Not Taking   isosorbide mononitrate (IMDUR) 30 MG 24 hr tablet Take 30 mg by mouth daily. (Patient not taking: Reported on 09/20/2022)   Not Taking   losartan (COZAAR) 25 MG tablet Take 50 mg by mouth daily. (Patient not taking: Reported on 09/08/2021)   Not Taking   metoprolol tartrate (LOPRESSOR) 25 MG tablet Take 12.5 mg by mouth 2 (two) times daily. (Patient not taking: Reported on 09/08/2021)   Not Taking   pregabalin (LYRICA) 25 MG capsule Take 25 mg by mouth in the morning and at bedtime. (Patient not taking: Reported on 09/08/2021)      simvastatin (ZOCOR) 40 MG tablet Take 40 mg by mouth at bedtime. (Patient not taking: Reported on 09/08/2021)   Not Taking   Vitamin D, Ergocalciferol, (DRISDOL) 1.25 MG (50000 UNIT) CAPS capsule Take 1 capsule by mouth once a week. (Patient not taking: Reported on 09/08/2021)   Not Taking   Social History   Socioeconomic History   Marital status: Single    Spouse name: Not on file   Number of children: Not on file   Years of education: Not on file   Highest education level: Not on file  Occupational History   Not on file  Tobacco Use   Smoking status: Every Day  Packs/day: 0    Types: Cigars, Cigarettes    Start date: 07/07/1995   Smokeless tobacco: Never   Tobacco comments:    2-3 cigars/day  Substance and Sexual Activity   Alcohol use: No   Drug use: No   Sexual activity: Yes  Other Topics Concern   Not on file  Social History Narrative   Not on file   Social Determinants of Health   Financial Resource Strain: Not on file  Food Insecurity: Not on file  Transportation Needs: Not on file  Physical Activity: Not on file  Stress: Not on file  Social Connections: Not on file  Intimate Partner Violence: Not on file    Family History  Problem Relation Age of Onset   Diabetes Father       Intake/Output Summary (Last 24 hours) at 09/23/2022 0804 Last data filed at 09/23/2022 0700 Gross per  24 hour  Intake 480 ml  Output 2700 ml  Net -2220 ml     Vitals:   09/22/22 1925 09/22/22 2350 09/23/22 0338 09/23/22 0700  BP: (!) 164/95 137/76 (!) 143/79 (!) 175/89  Pulse: 88 88 87 94  Resp: 16 18 19 18   Temp: 97.8 F (36.6 C) 99 F (37.2 C) 98.1 F (36.7 C) 98.5 F (36.9 C)  TempSrc: Oral   Oral  SpO2: 99% 92% 98% 98%  Weight:      Height:        PHYSICAL EXAM General: middle aged black male , well nourished, in no acute distress. HEENT:  Normocephalic and atraumatic. Neck:  No JVD.  Lungs: Normal respiratory effort on room air. Decreased breath sounds with trace crackles right base Heart: HRRR . Normal S1 and S2 without gallops or murmurs.  Abdomen: Non-distended appearing.  Msk: Normal strength and tone for age. Extremities: Warm and well perfused. No clubbing, cyanosis. No peripheral edema. Neuro: Alert and oriented X 3. Psych:  Answers questions appropriately.   Labs: Basic Metabolic Panel: Recent Labs    09/22/22 0414 09/23/22 0521  NA 138 139  K 3.3* 3.8  CL 95* 102  CO2 32 32  GLUCOSE 98 163*  BUN 28* 33*  CREATININE 1.46* 1.43*  CALCIUM 9.0 8.5*  MG 1.8 1.9  PHOS 4.4 3.3    Liver Function Tests: No results for input(s): "AST", "ALT", "ALKPHOS", "BILITOT", "PROT", "ALBUMIN" in the last 72 hours. No results for input(s): "LIPASE", "AMYLASE" in the last 72 hours. CBC: Recent Labs    09/22/22 0414 09/23/22 0521  WBC 8.4 9.9  HGB 12.7* 11.8*  HCT 37.3* 35.6*  MCV 92.6 93.7  PLT 237 246    Cardiac Enzymes: No results for input(s): "CKTOTAL", "CKMB", "CKMBINDEX", "TROPONINIHS" in the last 72 hours.  BNP: No results for input(s): "BNP" in the last 72 hours.  D-Dimer: No results for input(s): "DDIMER" in the last 72 hours. Hemoglobin A1C: No results for input(s): "HGBA1C" in the last 72 hours.  Fasting Lipid Panel: No results for input(s): "CHOL", "HDL", "LDLCALC", "TRIG", "CHOLHDL", "LDLDIRECT" in the last 72 hours. Thyroid  Function Tests: No results for input(s): "TSH", "T4TOTAL", "T3FREE", "THYROIDAB" in the last 72 hours.  Invalid input(s): "FREET3" Anemia Panel: No results for input(s): "VITAMINB12", "FOLATE", "FERRITIN", "TIBC", "IRON", "RETICCTPCT" in the last 72 hours.    Radiology: ECHOCARDIOGRAM COMPLETE  Result Date: 09/22/2022    ECHOCARDIOGRAM REPORT   Patient Name:   Chad Mezzanotte Sr. Date of Exam: 09/21/2022 Medical Rec #:  [id number]  Height:       66.0 in Accession #:    [id number]           Weight:       179.0 lb Date of Birth:  1961-04-24             BSA:          1.908 m Patient Age:    39 years             BP:           148/91 mmHg Patient Gender: M                    HR:           88 bpm. Exam Location:  [facility] Procedure: 2D Echo, Cardiac Doppler and Color Doppler Indications:     CHF  History:         Patient has prior history of Echocardiogram examinations, most                  recent 07/17/2020. CHF, CAD; Risk Factors:Hypertension and                  Diabetes.  Sonographer:     Wenda Low Referring Phys:  [id number] Athena Masse Diagnosing Phys: Isaias Cowman MD IMPRESSIONS  1. Left ventricular ejection fraction, by estimation, is 55 to 60%. The left ventricle has normal function. The left ventricle has no regional wall motion abnormalities. Left ventricular diastolic parameters were normal.  2. Right ventricular systolic function is normal. The right ventricular size is normal. There is normal pulmonary artery systolic pressure.  3. The mitral valve is normal in structure. Mild mitral valve regurgitation. No evidence of mitral stenosis.  4. The aortic valve is normal in structure. Aortic valve regurgitation is trivial. No aortic stenosis is present.  5. The inferior vena cava is normal in size with greater than 50% respiratory variability, suggesting right atrial pressure of 3 mmHg. FINDINGS  Left Ventricle: Left ventricular ejection fraction, by estimation, is 55 to 60%. The left  ventricle has normal function. The left ventricle has no regional wall motion abnormalities. The left ventricular internal cavity size was normal in size. There is  no left ventricular hypertrophy. Left ventricular diastolic parameters were normal. Right Ventricle: The right ventricular size is normal. No increase in right ventricular wall thickness. Right ventricular systolic function is normal. There is normal pulmonary artery systolic pressure. The tricuspid regurgitant velocity is 2.63 m/s, and  with an assumed right atrial pressure of 3 mmHg, the estimated right ventricular systolic pressure is [id number] mmHg. Left Atrium: Left atrial size was normal in size. Right Atrium: Right atrial size was normal in size. Pericardium: There is no evidence of pericardial effusion. Mitral Valve: The mitral valve is normal in structure. Mild mitral valve regurgitation. No evidence of mitral valve stenosis. MV peak gradient, 4.4 mmHg. The mean mitral valve gradient is 2.0 mmHg. Tricuspid Valve: The tricuspid valve is normal in structure. Tricuspid valve regurgitation is mild . No evidence of tricuspid stenosis. Aortic Valve: The aortic valve is normal in structure. Aortic valve regurgitation is trivial. No aortic stenosis is present. Aortic valve mean gradient measures 4.0 mmHg. Aortic valve peak gradient measures 8.1 mmHg. Aortic valve area, by VTI measures 2.52 cm. Pulmonic Valve: The pulmonic valve was normal in structure. Pulmonic valve regurgitation is not visualized. No evidence of pulmonic stenosis. Aorta: The aortic root is normal in size and structure.  Venous: The inferior vena cava is normal in size with greater than 50% respiratory variability, suggesting right atrial pressure of 3 mmHg. IAS/Shunts: No atrial level shunt detected by color flow Doppler.  LEFT VENTRICLE PLAX 2D LVIDd:         4.70 cm   Diastology LVIDs:         3.30 cm   LV e' medial:    5.77 cm/s LV PW:         1.40 cm   LV E/e' medial:  17.7 LV IVS:         1.10 cm   LV e' lateral:   6.85 cm/s LVOT diam:     2.00 cm   LV E/e' lateral: 14.9 LV SV:         76 LV SV Index:   40 LVOT Area:     3.14 cm  RIGHT VENTRICLE RV Basal diam:  4.20 cm TAPSE (M-mode): 2.1 cm LEFT ATRIUM             Index        RIGHT ATRIUM           Index LA diam:        4.50 cm 2.36 cm/m   RA Area:     18.60 cm LA Vol (A2C):   54.3 ml 28.46 ml/m  RA Volume:   58.40 ml  30.61 ml/m LA Vol (A4C):   51.8 ml 27.15 ml/m LA Biplane Vol: 58.1 ml 30.45 ml/m  AORTIC VALVE                    PULMONIC VALVE AV Area (Vmax):    2.81 cm     PV Vmax:       0.92 m/s AV Area (Vmean):   2.58 cm     PV Peak grad:  3.4 mmHg AV Area (VTI):     2.52 cm AV Vmax:           142.00 cm/s AV Vmean:          96.100 cm/s AV VTI:            0.300 m AV Peak Grad:      8.1 mmHg AV Mean Grad:      4.0 mmHg LVOT Vmax:         127.00 cm/s LVOT Vmean:        79.000 cm/s LVOT VTI:          0.241 m LVOT/AV VTI ratio: 0.80  AORTA Ao Root diam: 3.00 cm MITRAL VALVE                TRICUSPID VALVE MV Area (PHT): 4.99 cm     TR Peak grad:   27.7 mmHg MV Area VTI:   3.17 cm     TR Vmax:        263.00 cm/s MV Peak grad:  4.4 mmHg MV Mean grad:  2.0 mmHg     SHUNTS MV Vmax:       1.05 m/s     Systemic VTI:  0.24 m MV Vmean:      65.1 cm/s    Systemic Diam: 2.00 cm MV Decel Time: 152 msec MV E velocity: 102.00 cm/s MV A velocity: 80.70 cm/s MV E/A ratio:  1.26 Isaias Cowman MD Electronically signed by Isaias Cowman MD Signature Date/Time: 09/22/2022/1:13:29 PM    Final    US SCROTUM W/DOPPLER  Result Date: 09/20/2022 CLINICAL DATA:  Scrotal swell EXAM:  SCROTAL ULTRASOUND DOPPLER ULTRASOUND OF THE TESTICLES TECHNIQUE: Complete ultrasound examination of the testicles, epididymis, and other scrotal structures was performed. Color and spectral Doppler ultrasound were also utilized to evaluate blood flow to the testicles. COMPARISON:  None Available. FINDINGS: Right testicle Measurements: 4.2 x 2.9 x 2.7 cm. No mass or  microlithiasis visualized. Left testicle Measurements: 4.1 x 2.9 x 2.5 cm. No mass or microlithiasis visualized. Right epididymis:  Normal in size and appearance. Left epididymis:  Normal in size and appearance. Hydrocele:  None visualized. Varicocele:  None visualized. Pulsed Doppler interrogation of both testes demonstrates normal low resistance arterial and venous waveforms bilaterally. Other: Marked bilateral scrotal wall thickening, 4.9 cm on the right and 3.9 cm on the left. IMPRESSION: Scrotal wall thickening bilaterally. No testicular abnormality. Electronically Signed   By: Rolm Baptise M.D.   On: 09/20/2022 02:24   DG Toe Great Left  Result Date: 09/20/2022 CLINICAL DATA:  Left great toe hematoma. EXAM: LEFT GREAT TOE COMPARISON:  None Available. FINDINGS: A nondisplaced fracture deformity is seen involving the medial aspect of the base of the distal phalanx of the left great toe. There is no evidence of dislocation. There is no evidence of arthropathy or other focal bone abnormality. There is mild diffuse soft tissue swelling. IMPRESSION: Nondisplaced fracture of the distal phalanx of the left great toe. Electronically Signed   By: Virgina Norfolk M.D.   On: 09/20/2022 01:40   DG Chest 2 View  Result Date: 09/20/2022 CLINICAL DATA:  Chest pain and shortness of breath. EXAM: CHEST - 2 VIEW COMPARISON:  April 03, 2021 FINDINGS: The heart size and mediastinal contours are within normal limits. Low lung volumes are seen. Marked severity airspace disease is noted within the right lung base with mild to moderate severity linear atelectasis seen within the mid and lower left lung. There is a small to moderate sized right-sided pleural effusion. A very small left pleural effusion is also suspected. No pneumothorax is identified. The visualized skeletal structures are unremarkable. IMPRESSION: 1. Low lung volumes with marked severity right basilar airspace disease. 2. Mild to moderate severity linear  atelectasis within the mid and lower left lung. 3. Bilateral pleural effusions, right greater than left. Electronically Signed   By: Virgina Norfolk M.D.   On: 09/20/2022 01:38    ECHO 07/17/2020  1. Left ventricular ejection fraction, by estimation, is 50 to 55%. The  left ventricle has low normal function. The left ventricle has no regional  wall motion abnormalities. There is mild left ventricular hypertrophy.  Left ventricular diastolic  parameters are consistent with Grade I diastolic dysfunction (impaired  relaxation).   2. Right ventricular systolic function was not well visualized. The right  ventricular size is normal.   3. The mitral valve is normal in structure. No evidence of mitral valve  regurgitation.   4. The aortic valve is tricuspid. Aortic valve regurgitation is not  visualized. Mild to moderate aortic valve sclerosis/calcification is  present, without any evidence of aortic stenosis.    Coronary CTA 12/12/2020  IMPRESSION: 1. Coronary calcium score of 9.05. This was 63rd percentile for age and sex matched control.   2. Normal coronary origin with right dominance.   3. Mild calcified plaque in the proximal RCA causing minimal stenosis (<25%).   4. CAD-RADS 1. Minimal non-obstructive CAD (0-24%). Consider non-atherosclerotic causes of chest pain. Consider preventive therapy and risk factor modification.   Electronically Signed: By: Kate Sable M.D. On: 12/12/2020 12:56  TELEMETRY  reviewed by me (LT) 09/23/2022 : NSR 80s-90s  EKG reviewed by me: NSR with PVC PACs and nonspecific TW abnormality   Data reviewed by me (LT) 09/23/2022: hospitalist progress note, nursing notes, last 24h vitals tele labs imaging I/O    Principal Problem:   Hypertensive emergency Active Problems:   Diabetes mellitus, type II ([place])   Elevated troponin   Bilateral pleural effusion   Anasarca   Fracture of distal phalanx of left great toe   Scrotal edema   Acute  respiratory failure with hypoxia (H[facility])   Anemia   Acute on chronic diastolic CHF (congestive heart failure) ([place])   Coronary artery disease    ASSESSMENT AND PLAN:  Chad Pica Sr. Is a 71yoM with a PMH of HFpEF (50-55%, G1DD, mild - mod aortic sclerosis 07/2020), nonobstructive CAD by cCTA 12/2020, HTN, DM2 (7.1%), who presented to [facility] ED 09/20/2022 with swollen genitals and a sore on his great toe after a trip to [place]. He was also hypoxic to 84% on room air & hypertensive to 202/123 on admission. Admits to not taking his BP medications or insulin for several months. Clinical improvement after initial IV diuresis.   # hypertensive urgency  In the setting of medication noncompliance. Ongoing titration of BP meds & GDMT as below  # acute on chronic HFpEF (50-55%)  Hypervolemic with peripheral edema and groin swelling, BNP elevated at 1700, clinical improvement with initial diuresis - continue GDMT with increased metoprolol XL from 50mg  to 100mg  daily, losartan 50mg  daily, spiro 25mg  daily, imdur 30 daily. No SGLT2i with scrotal edema/wall thicking on U/S - s/p lasix gtt, continue lasix 60mg  PO daily  - Cr plateaued  - echo complete with preserved EF, mod MR. Overall similar to prior - ok for discharge from a cardiac perspective tomorrow AM   # demand ischemia # nonobstructive CAD by cCTA Chest pain free, EKG nonacute. HS trop minimal elevation and insignificant delta at 37-39, most c/w demand ischemia and not ACS.  - continue aspirin 81mg  daily and atorva 40mg  daily   # tobacco abuse Strongly encouraged complete cessation   Ok for discharge today from a cardiac perspective. Will arrange for follow up in clinic with Dr. Clayborn Bigness in 1-2 weeks.    This patient's plan of care was discussed and created with Dr. Saralyn Pilar and he is in agreement.  Signed: Tristan Schroeder , PA-C 09/23/2022, 8:04 AM [facility]

## 2022-09-23 NOTE — TOC Initial Note (Signed)
Transition of Care (TOC) - Initial/Assessment Note    Patient Details  Name: Chad Kenimer Sr. MRN: [id number] Date of Birth: 25-Aug-1960  Transition of Care [facility]) CM/SW Contact:    Laurena Slimmer, RN Phone Number: 09/23/2022, 11:12 AM  Clinical Narrative:                  Transition of Care [facility]) Screening Note   Patient Details  Name: Chad Crehan Sr. Date of Birth: August 02, 1960   Transition of Care [facility]) CM/SW Contact:    Laurena Slimmer, RN Phone Number: 09/23/2022, 11:12 AM    Transition of Care Department [facility]) has reviewed patient and no TOC needs have been identified at this time. We will continue to monitor patient advancement through interdisciplinary progression rounds. If new patient transition needs arise, please place a TOC consult.          Patient Goals and CMS Choice            Expected Discharge Plan and Services         Expected Discharge Date: 09/23/22                                    Prior Living Arrangements/Services                       Activities of Daily Living Home Assistive Devices/Equipment: None ADL Screening (condition at time of admission) Patient's cognitive ability adequate to safely complete daily activities?: Yes Is the patient deaf or have difficulty hearing?: No Does the patient have difficulty seeing, even when wearing glasses/contacts?: No Does the patient have difficulty concentrating, remembering, or making decisions?: No Patient able to express need for assistance with ADLs?: No Does the patient have difficulty dressing or bathing?: No Independently performs ADLs?: Yes (appropriate for developmental age) Does the patient have difficulty walking or climbing stairs?: No Weakness of Legs: None Weakness of Arms/Hands: None  Permission Sought/Granted                  Emotional Assessment              Admission diagnosis:  Anasarca [R60.1] Pleural effusion [J90] Acute respiratory  failure with hypoxia ([place]) [J96.01] Hypertensive emergency [I16.1] Closed nondisplaced fracture of distal phalanx of left great toe, initial encounter [S92.425A] Patient Active Problem List   Diagnosis Date Noted   Hypertensive emergency 09/20/2022   Bilateral pleural effusion 09/20/2022   Anasarca 09/20/2022   Fracture of distal phalanx of left great toe 09/20/2022   Scrotal edema 09/20/2022   Acute respiratory failure with hypoxia ([place]) 09/20/2022   Anemia 09/20/2022   Acute on chronic diastolic CHF (congestive heart failure) ([place]) 09/20/2022   Coronary artery disease 09/20/2022   Diabetes mellitus, type II ([place]) 07/16/2020   HTN (hypertension) 07/16/2020   Troponin level elevated 07/16/2020   Elevated troponin 07/16/2020   PCP:  Gladstone Lighter, MD Pharmacy:   [facility] [address] ([address]), [place] [address] [place]) [place] [address] Phone: [phone] Fax: [place] [address] Phone: [phone] Fax: [phone]     Social Determinants of Health (SDOH) Social History: SDOH Screenings   Depression (PHQ2-9): Low Risk  (09/08/2021)  Tobacco Use: High Risk (09/08/2021)   SDOH Interventions:     Readmission  Risk Interventions     No data to display

## 2022-09-23 NOTE — Discharge Summary (Signed)
[facility] Discharge Summary   Patient: Chad Good. [id number]  PCP: Gladstone Lighter, MD  Date of admission: 09/20/2022   Date of discharge:  09/23/2022     Discharge Diagnoses:  Principal Problem:   Hypertensive emergency Active Problems:   Acute respiratory failure with hypoxia (HCC)   Acute on chronic diastolic CHF (congestive heart failure) (HCC)   Elevated troponin   Bilateral pleural effusion   Coronary artery disease   Anasarca   Scrotal edema   Diabetes mellitus, type II ([place])   Fracture of distal phalanx of left great toe   Anemia   Admitted From: Home Disposition:  Home   Recommendations for Outpatient Follow-up:  PCP: in 1 wk Follow-up with cardiology in 1-2 weeks Follow up LABS/TEST:  BMP in 1 wk, CXR in 2-3 weeks    Follow-up Information     Yolonda Kida, MD. Go in 1 week(s).   Specialties: Cardiology, Internal Medicine Why: Appointment on Tuesday, 10/06/2022 at 9:15am. Contact information: [place] [address] [phone]         Gladstone Lighter, MD. Schedule an appointment as soon as possible for a visit in 1 week(s).   Specialty: Internal Medicine Contact information: [place] [address] [phone]                Diet recommendation: Cardiac diet  Activity: The patient is advised to gradually reintroduce usual activities, as tolerated  Discharge Condition: stable  Code Status: Full code   History of present illness: As per the H and P dictated on admission Hospital Course:  Clois Mcnall Sr. is a 62 y.o. male with medical history significant for Hypertension, insulin-dependent type 2 diabetes, Mild nonobstructive CAD with abnormal stress test [id number], diastolic CHF (EF 50 to [id number] with G1 DD 07/2020), HLD, neuropathy, last seen by his cardiologist on 02/2021  who presents to the ED with a concern for swollen genitals and a sore great toe that he noticed after  a trip to [place] and back.   ED workup: O2 sat 84% on room air and blood pressure 202/123.  Troponin 39 and BNP 1778  EKG, personally viewed and interpreted shows sinus at 90 with PVCs and nonspecific ST T wave changes. Chest x-ray showed bilateral pleural effusions right greater than left Scrotal ultrasound showed scrotal wall thickening without testicular abnormality X-ray left great toe showed a nondisplaced fracture of the distal phalanx of the left great toe Patient treated with Lasix 80 mg IV and Nitropaste.  Hospitalist consulted for admission.     Assessment and Plan: # Acute on chronic diastolic CHF (congestive heart failure) exacerbation Hypertensive emergency, resolved Bilateral pleural effusions Acute respiratory failure with hypoxia, Resolved  Medication nonadherence, counseling done Patient presents with scrotal edema, bilateral pleural effusion on chest x-ray, BNP over 1700. Echo from 2022 showing grade 1 diastolic dysfunction. S/p IV Lasix, 3/17 s/p Lasix IV infusion 8 mg/hr, On 3/18 decrease the rate of IV Lasix infusion 4 mg/h due to slightly elevated creatinine. On 3/19 DC'd Lasix IV infusion, started Lasix 60 mg p.o. daily. Continue metoprolol, losartan, Imdur. Supplemental oxygen weaned off currently saturating well on room air. TTE LVEF 55 to 60% no any other significant findings.  Patient was cleared to discharge home to follow-up with cardiology in 1 to 2 weeks.   # Elevated troponin, h/o Nonobstructive CAD Patient denies chest pain, EKG nonacute. Had an abnormal nuclear stress test in 2022 and a coronary  calcium score of 9, Elevated troponin of 39 likely secondary to demand ischemia. TTE as above. Resume metoprolol, simvastatin, losartan and aspirin. Seen by Cardiology. # Hypokalemia due to diuresis, potassium repleted. Resolved # Scrotal edema and Anasarca, due to diastolic CHF Scrotal ultrasound showing no testicular abnormality. Expecting improvement with diuretic  regimen as described above under CHF. F/u with PCP # Diabetes mellitus, type II, HbA1c 7.1, well-controlled.  Discontinued glipizide due to hypoglycemia episode.  Continue 70/30 insulin 20 units twice daily, continue diabetic diet, continue to monitor CBG and follow with PCP for further management. # Anemia, Hemoglobin 11.8, no recent baseline. Anemia panel, transferrin saturation 14%, slightly low, folate and B12 within normal range.  Follow with PCP for further management as an outpatient # Fracture of distal phalanx of left great toe: Podiatry consult, Rec no intervention, weightbearing as tolerated.  Continue prn meds for pain control # Urinary retention, Foley catheter was inserted on 3/17, Started Flomax 3/19 DC Foley catheter, passed voiding trial.  Follow-up with urology as an outpatient # Vitamin D deficiency: started vitamin D 50,000 units p.o. weekly, follow with PCP to repeat vitamin D level after 3 to 6 months. # Nicotine dependence, patient smokes cigarettes.  Smoking cessation counseling done.  Body mass index is 27.28 kg/m.  Nutrition Interventions:   Patient was ambulatory without any assistance. On the day of the discharge the patient's vitals were stable, and no other acute medical condition were reported by patient. the patient was felt safe to be discharge at Home.  Consultants: Cardiology and Podiatry Procedures: None  Discharge Exam: General: Appear in no distress, no Rash; Oral Mucosa Clear, moist. Cardiovascular: S1 and S2 Present, no Murmur, Respiratory: normal respiratory effort, Bilateral Air entry present and no Crackles, no wheezes Abdomen: Bowel Sound present, Soft and no tenderness, no hernia Extremities: no Pedal edema, no calf tenderness Neurology: alert and oriented to time, place, and person affect appropriate.  Filed Weights   09/21/22 0358 09/22/22 0840 09/23/22 0830  Weight: 81.2 kg 76.5 kg 76.7 kg   Vitals:   09/23/22 0338 09/23/22 0700  BP:  (!) 143/79 (!) 175/89  Pulse: 87 94  Resp: 19 18  Temp: 98.1 F (36.7 C) 98.5 F (36.9 C)  SpO2: 98% 98%    DISCHARGE MEDICATION: Allergies as of 09/23/2022   No Known Allergies      Medication List     STOP taking these medications    glipiZIDE 5 MG 24 hr tablet Commonly known as: GLUCOTROL XL   glipiZIDE-metformin 5-500 MG tablet Commonly known as: METAGLIP   losartan-hydrochlorothiazide 50-12.5 MG tablet Commonly known as: HYZAAR   metoprolol tartrate 25 MG tablet Commonly known as: LOPRESSOR   pregabalin 25 MG capsule Commonly known as: LYRICA   simvastatin 40 MG tablet Commonly known as: ZOCOR       TAKE these medications    aspirin EC 81 MG tablet Take 1 tablet (81 mg total) by mouth daily.   furosemide 20 MG tablet Commonly known as: LASIX Take 3 tablets (60 mg total) by mouth daily. Start taking on: September 24, 2022   hydrALAZINE 50 MG tablet Commonly known as: APRESOLINE Take 1 tablet (50 mg total) by mouth 3 (three) times daily as needed (systolic BP 99991111).   insulin NPH-regular Human (70-30) 100 UNIT/ML injection Inject 12 Units into the skin 2 (two) times daily with a meal. What changed: how much to take   isosorbide mononitrate 30 MG 24 hr tablet Commonly known as:  IMDUR Take 1 tablet (30 mg total) by mouth daily.   losartan 25 MG tablet Commonly known as: COZAAR Take 2 tablets (50 mg total) by mouth daily.   metoprolol succinate 100 MG 24 hr tablet Commonly known as: TOPROL-XL Take 1 tablet (100 mg total) by mouth daily. Take with or immediately following a meal. Start taking on: September 24, 2022   rosuvastatin 5 MG tablet Commonly known as: CRESTOR Take 1 tablet (5 mg total) by mouth daily.   spironolactone 25 MG tablet Commonly known as: ALDACTONE Take 1 tablet (25 mg total) by mouth daily. Start taking on: September 24, 2022   tamsulosin 0.4 MG Caps capsule Commonly known as: FLOMAX Take 1 capsule (0.4 mg total) by mouth daily  after supper.   Vitamin D (Ergocalciferol) 1.25 MG (50000 UNIT) Caps capsule Commonly known as: DRISDOL Take 1 capsule (50,000 Units total) by mouth once a week.       No Known Allergies Discharge Instructions     Call MD for:   Complete by: As directed    Edema of Lower extremities   Call MD for:  difficulty breathing, headache or visual disturbances   Complete by: As directed    Call MD for:  extreme fatigue   Complete by: As directed    Call MD for:  persistant dizziness or light-headedness   Complete by: As directed    Diet - low sodium heart healthy   Complete by: As directed    Discharge instructions   Complete by: As directed    F/u PCP in 1wk F/u Cardio in 1-2 weeks   Increase activity slowly   Complete by: As directed    No wound care   Complete by: As directed        The results of significant diagnostics from this hospitalization (including imaging, microbiology, ancillary and laboratory) are listed below for reference.    Significant Diagnostic Studies: ECHOCARDIOGRAM COMPLETE  Result Date: 09/22/2022    ECHOCARDIOGRAM REPORT   Patient Name:   Lazar Funaro Sr. Date of Exam: 09/21/2022 Medical Rec #:  [id number]            Height:       66.0 in Accession #:    [id number]           Weight:       179.0 lb Date of Birth:  May 31, 1961             BSA:          1.908 m Patient Age:    62 years             BP:           148/91 mmHg Patient Gender: M                    HR:           88 bpm. Exam Location:  [facility] Procedure: 2D Echo, Cardiac Doppler and Color Doppler Indications:     CHF  History:         Patient has prior history of Echocardiogram examinations, most                  recent 07/17/2020. CHF, CAD; Risk Factors:Hypertension and                  Diabetes.  Sonographer:     Wenda Low Referring Phys:  [id number] Athena Masse Diagnosing Phys: Isaias Cowman MD  IMPRESSIONS  1. Left ventricular ejection fraction, by estimation, is 55 to 60%. The left  ventricle has normal function. The left ventricle has no regional wall motion abnormalities. Left ventricular diastolic parameters were normal.  2. Right ventricular systolic function is normal. The right ventricular size is normal. There is normal pulmonary artery systolic pressure.  3. The mitral valve is normal in structure. Mild mitral valve regurgitation. No evidence of mitral stenosis.  4. The aortic valve is normal in structure. Aortic valve regurgitation is trivial. No aortic stenosis is present.  5. The inferior vena cava is normal in size with greater than 50% respiratory variability, suggesting right atrial pressure of 3 mmHg. FINDINGS  Left Ventricle: Left ventricular ejection fraction, by estimation, is 55 to 60%. The left ventricle has normal function. The left ventricle has no regional wall motion abnormalities. The left ventricular internal cavity size was normal in size. There is  no left ventricular hypertrophy. Left ventricular diastolic parameters were normal. Right Ventricle: The right ventricular size is normal. No increase in right ventricular wall thickness. Right ventricular systolic function is normal. There is normal pulmonary artery systolic pressure. The tricuspid regurgitant velocity is 2.63 m/s, and  with an assumed right atrial pressure of 3 mmHg, the estimated right ventricular systolic pressure is 99991111 mmHg. Left Atrium: Left atrial size was normal in size. Right Atrium: Right atrial size was normal in size. Pericardium: There is no evidence of pericardial effusion. Mitral Valve: The mitral valve is normal in structure. Mild mitral valve regurgitation. No evidence of mitral valve stenosis. MV peak gradient, 4.4 mmHg. The mean mitral valve gradient is 2.0 mmHg. Tricuspid Valve: The tricuspid valve is normal in structure. Tricuspid valve regurgitation is mild . No evidence of tricuspid stenosis. Aortic Valve: The aortic valve is normal in structure. Aortic valve regurgitation is  trivial. No aortic stenosis is present. Aortic valve mean gradient measures 4.0 mmHg. Aortic valve peak gradient measures 8.1 mmHg. Aortic valve area, by VTI measures 2.52 cm. Pulmonic Valve: The pulmonic valve was normal in structure. Pulmonic valve regurgitation is not visualized. No evidence of pulmonic stenosis. Aorta: The aortic root is normal in size and structure. Venous: The inferior vena cava is normal in size with greater than 50% respiratory variability, suggesting right atrial pressure of 3 mmHg. IAS/Shunts: No atrial level shunt detected by color flow Doppler.  LEFT VENTRICLE PLAX 2D LVIDd:         4.70 cm   Diastology LVIDs:         3.30 cm   LV e' medial:    5.77 cm/s LV PW:         1.40 cm   LV E/e' medial:  17.7 LV IVS:        1.10 cm   LV e' lateral:   6.85 cm/s LVOT diam:     2.00 cm   LV E/e' lateral: 14.9 LV SV:         76 LV SV Index:   40 LVOT Area:     3.14 cm  RIGHT VENTRICLE RV Basal diam:  4.20 cm TAPSE (M-mode): 2.1 cm LEFT ATRIUM             Index        RIGHT ATRIUM           Index LA diam:        4.50 cm 2.36 cm/m   RA Area:     18.60 cm LA Vol (A2C):   54.3  ml 28.46 ml/m  RA Volume:   58.40 ml  30.61 ml/m LA Vol (A4C):   51.8 ml 27.15 ml/m LA Biplane Vol: 58.1 ml 30.45 ml/m  AORTIC VALVE                    PULMONIC VALVE AV Area (Vmax):    2.81 cm     PV Vmax:       0.92 m/s AV Area (Vmean):   2.58 cm     PV Peak grad:  3.4 mmHg AV Area (VTI):     2.52 cm AV Vmax:           142.00 cm/s AV Vmean:          96.100 cm/s AV VTI:            0.300 m AV Peak Grad:      8.1 mmHg AV Mean Grad:      4.0 mmHg LVOT Vmax:         127.00 cm/s LVOT Vmean:        79.000 cm/s LVOT VTI:          0.241 m LVOT/AV VTI ratio: 0.80  AORTA Ao Root diam: 3.00 cm MITRAL VALVE                TRICUSPID VALVE MV Area (PHT): 4.99 cm     TR Peak grad:   27.7 mmHg MV Area VTI:   3.17 cm     TR Vmax:        263.00 cm/s MV Peak grad:  4.4 mmHg MV Mean grad:  2.0 mmHg     SHUNTS MV Vmax:       1.05 m/s      Systemic VTI:  0.24 m MV Vmean:      65.1 cm/s    Systemic Diam: 2.00 cm MV Decel Time: 152 msec MV E velocity: 102.00 cm/s MV A velocity: 80.70 cm/s MV E/A ratio:  1.26 Isaias Cowman MD Electronically signed by Isaias Cowman MD Signature Date/Time: 09/22/2022/1:13:29 PM    Final    US SCROTUM W/DOPPLER  Result Date: 09/20/2022 CLINICAL DATA:  Scrotal swell EXAM: SCROTAL ULTRASOUND DOPPLER ULTRASOUND OF THE TESTICLES TECHNIQUE: Complete ultrasound examination of the testicles, epididymis, and other scrotal structures was performed. Color and spectral Doppler ultrasound were also utilized to evaluate blood flow to the testicles. COMPARISON:  None Available. FINDINGS: Right testicle Measurements: 4.2 x 2.9 x 2.7 cm. No mass or microlithiasis visualized. Left testicle Measurements: 4.1 x 2.9 x 2.5 cm. No mass or microlithiasis visualized. Right epididymis:  Normal in size and appearance. Left epididymis:  Normal in size and appearance. Hydrocele:  None visualized. Varicocele:  None visualized. Pulsed Doppler interrogation of both testes demonstrates normal low resistance arterial and venous waveforms bilaterally. Other: Marked bilateral scrotal wall thickening, 4.9 cm on the right and 3.9 cm on the left. IMPRESSION: Scrotal wall thickening bilaterally. No testicular abnormality. Electronically Signed   By: Rolm Baptise M.D.   On: 09/20/2022 02:24   DG Toe Great Left  Result Date: 09/20/2022 CLINICAL DATA:  Left great toe hematoma. EXAM: LEFT GREAT TOE COMPARISON:  None Available. FINDINGS: A nondisplaced fracture deformity is seen involving the medial aspect of the base of the distal phalanx of the left great toe. There is no evidence of dislocation. There is no evidence of arthropathy or other focal bone abnormality. There is mild diffuse soft tissue swelling. IMPRESSION: Nondisplaced fracture of the distal phalanx of  the left great toe. Electronically Signed   By: Virgina Norfolk M.D.   On:  09/20/2022 01:40   DG Chest 2 View  Result Date: 09/20/2022 CLINICAL DATA:  Chest pain and shortness of breath. EXAM: CHEST - 2 VIEW COMPARISON:  April 03, 2021 FINDINGS: The heart size and mediastinal contours are within normal limits. Low lung volumes are seen. Marked severity airspace disease is noted within the right lung base with mild to moderate severity linear atelectasis seen within the mid and lower left lung. There is a small to moderate sized right-sided pleural effusion. A very small left pleural effusion is also suspected. No pneumothorax is identified. The visualized skeletal structures are unremarkable. IMPRESSION: 1. Low lung volumes with marked severity right basilar airspace disease. 2. Mild to moderate severity linear atelectasis within the mid and lower left lung. 3. Bilateral pleural effusions, right greater than left. Electronically Signed   By: Virgina Norfolk M.D.   On: 09/20/2022 01:38    Microbiology: Recent Results (from the past 240 hour(s))  Chlamydia/NGC rt PCR ([place] only)     Status: None   Collection Time: 09/20/22  2:16 AM   Specimen: Urine  Result Value Ref Range Status   Specimen source GC/Chlam URINE, RANDOM  Final   Chlamydia Tr NOT DETECTED NOT DETECTED Final   N gonorrhoeae NOT DETECTED NOT DETECTED Final    Comment: (NOTE) This CT/NG assay has not been evaluated in patients with a history of  hysterectomy. Performed at [facility], [address] [place], [place] [address]      Labs: CBC: Recent Labs  Lab 09/20/22 0109 09/21/22 0552 09/22/22 0414 09/23/22 0521  WBC 7.5 9.0 8.4 9.9  HGB 12.7* 12.5* 12.7* 11.8*  HCT 38.3* 36.7* 37.3* 35.6*  MCV 94.3 91.5 92.6 93.7  PLT 213 220 237 0000000   Basic Metabolic Panel: Recent Labs  Lab 09/20/22 0109 09/20/22 0556 09/21/22 0552 09/22/22 0414 09/23/22 0521  NA 134*  --  138 138 139  K 3.6  --  3.3* 3.3* 3.8  CL 103  --  97* 95* 102  CO2 27  --  31 32 32  GLUCOSE 168*  --   126* 98 163*  BUN 17  --  22 28* 33*  CREATININE 1.20  --  1.33* 1.46* 1.43*  CALCIUM 8.0*  --  8.8* 9.0 8.5*  MG  --  1.9 1.7 1.8 1.9  PHOS  --  3.3 4.2 4.4 3.3   Liver Function Tests: No results for input(s): "AST", "ALT", "ALKPHOS", "BILITOT", "PROT", "ALBUMIN" in the last 168 hours. No results for input(s): "LIPASE", "AMYLASE" in the last 168 hours. No results for input(s): "AMMONIA" in the last 168 hours. Cardiac Enzymes: No results for input(s): "CKTOTAL", "CKMB", "CKMBINDEX", "TROPONINI" in the last 168 hours. BNP (last 3 results) Recent Labs    09/20/22 0110  BNP 1,778.7*   CBG: Recent Labs  Lab 09/22/22 0745 09/22/22 1228 09/22/22 1559 09/22/22 2123 09/23/22 0741  GLUCAP 91 224* 242* 95 159*    Time spent: 35 minutes  Signed:  Val Riles  [facility] 09/23/2022 11:50 AM
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
## 2022-09-30 NOTE — Progress Notes (Deleted)
   Patient ID: Chad Corner Sr., male    DOB: 09-Nov-1960, 62 y.o.   MRN: [id number]  HPI  [name] is a 62 y/o male with a history of  Echo 09/21/22: EF 55-60% along with mild MR.   Admitted 09/20/22 due to   He presents today for his initial visit with a chief complaint of   Review of Systems    Physical Exam

## 2022-10-01 ENCOUNTER — Encounter: Payer: BLUE CROSS/BLUE SHIELD | Admitting: Family

## 2022-10-01 ENCOUNTER — Telehealth: Payer: Self-pay | Admitting: Family

## 2022-10-01 NOTE — Telephone Encounter (Signed)
Patient did not show for his initial [facility] appointment on 10/01/22.

## 2022-11-06 ENCOUNTER — Other Ambulatory Visit: Payer: Self-pay

## 2023-03-12 ENCOUNTER — Other Ambulatory Visit: Payer: Self-pay

## 2023-03-12 ENCOUNTER — Encounter: Payer: Self-pay | Admitting: Emergency Medicine

## 2023-03-12 ENCOUNTER — Emergency Department: Payer: BLUE CROSS/BLUE SHIELD

## 2023-03-12 ENCOUNTER — Inpatient Hospital Stay
Admission: EM | Admit: 2023-03-12 | Discharge: 2023-03-15 | DRG: 291 | Disposition: A | Discharge status: Home / Self Care | Payer: BLUE CROSS/BLUE SHIELD | Attending: Internal Medicine | Admitting: Internal Medicine

## 2023-03-12 DIAGNOSIS — Z66 Do not resuscitate: Secondary | ICD-10-CM | POA: Diagnosis present

## 2023-03-12 DIAGNOSIS — F17290 Nicotine dependence, other tobacco product, uncomplicated: Secondary | ICD-10-CM | POA: Diagnosis present

## 2023-03-12 DIAGNOSIS — Z6829 Body mass index (BMI) 29.0-29.9, adult: Secondary | ICD-10-CM

## 2023-03-12 DIAGNOSIS — I5033 Acute on chronic diastolic (congestive) heart failure: Secondary | ICD-10-CM | POA: Diagnosis present

## 2023-03-12 DIAGNOSIS — E663 Overweight: Secondary | ICD-10-CM | POA: Diagnosis present

## 2023-03-12 DIAGNOSIS — I160 Hypertensive urgency: Secondary | ICD-10-CM | POA: Diagnosis present

## 2023-03-12 DIAGNOSIS — E119 Type 2 diabetes mellitus without complications: Secondary | ICD-10-CM | POA: Diagnosis present

## 2023-03-12 DIAGNOSIS — Z91148 Patient's other noncompliance with medication regimen for other reason: Secondary | ICD-10-CM | POA: Diagnosis not present

## 2023-03-12 DIAGNOSIS — E785 Hyperlipidemia, unspecified: Secondary | ICD-10-CM | POA: Diagnosis present

## 2023-03-12 DIAGNOSIS — Z7982 Long term (current) use of aspirin: Secondary | ICD-10-CM

## 2023-03-12 DIAGNOSIS — I2489 Other forms of acute ischemic heart disease: Secondary | ICD-10-CM | POA: Diagnosis present

## 2023-03-12 DIAGNOSIS — I110 Hypertensive heart disease with heart failure: Principal | ICD-10-CM | POA: Diagnosis present

## 2023-03-12 DIAGNOSIS — Z1152 Encounter for screening for COVID-19: Secondary | ICD-10-CM

## 2023-03-12 DIAGNOSIS — Z833 Family history of diabetes mellitus: Secondary | ICD-10-CM | POA: Diagnosis not present

## 2023-03-12 DIAGNOSIS — F17200 Nicotine dependence, unspecified, uncomplicated: Secondary | ICD-10-CM | POA: Diagnosis present

## 2023-03-12 DIAGNOSIS — I5043 Acute on chronic combined systolic (congestive) and diastolic (congestive) heart failure: Secondary | ICD-10-CM | POA: Diagnosis present

## 2023-03-12 DIAGNOSIS — I5023 Acute on chronic systolic (congestive) heart failure: Secondary | ICD-10-CM | POA: Diagnosis present

## 2023-03-12 DIAGNOSIS — I10 Essential (primary) hypertension: Secondary | ICD-10-CM | POA: Diagnosis present

## 2023-03-12 DIAGNOSIS — Z79899 Other long term (current) drug therapy: Secondary | ICD-10-CM | POA: Diagnosis not present

## 2023-03-12 DIAGNOSIS — J9601 Acute respiratory failure with hypoxia: Secondary | ICD-10-CM | POA: Diagnosis present

## 2023-03-12 DIAGNOSIS — E7800 Pure hypercholesterolemia, unspecified: Secondary | ICD-10-CM | POA: Diagnosis present

## 2023-03-12 DIAGNOSIS — Z794 Long term (current) use of insulin: Secondary | ICD-10-CM | POA: Diagnosis not present

## 2023-03-12 LAB — COMPREHENSIVE METABOLIC PANEL
ALT: 33 U/L (ref 0–44)
AST: 41 U/L (ref 15–41)
Albumin: 3.5 g/dL (ref 3.5–5.0)
Alkaline Phosphatase: 388 U/L — ABNORMAL HIGH (ref 38–126)
Anion gap: 10 (ref 5–15)
BUN: 20 mg/dL (ref 8–23)
CO2: 25 mmol/L (ref 22–32)
Calcium: 8.8 mg/dL — ABNORMAL LOW (ref 8.9–10.3)
Chloride: 104 mmol/L (ref 98–111)
Creatinine, Ser: 1.34 mg/dL — ABNORMAL HIGH (ref 0.61–1.24)
GFR, Estimated: >60 mL/min (ref >60)
Glucose, Bld: 186 mg/dL — ABNORMAL HIGH (ref 70–99)
Potassium: 4.4 mmol/L (ref 3.5–5.1)
Sodium: 139 mmol/L (ref 135–145)
Total Bilirubin: 1.5 mg/dL — ABNORMAL HIGH (ref 0.3–1.2)
Total Protein: 7.4 g/dL (ref 6.5–8.1)

## 2023-03-12 LAB — CBC WITH DIFFERENTIAL/PLATELET
Abs Immature Granulocytes: 0.02 10*3/uL (ref 0.00–0.07)
Basophils Absolute: 0 10*3/uL (ref 0.0–0.1)
Basophils Relative: 1 %
Eosinophils Absolute: 0.2 10*3/uL (ref 0.0–0.5)
Eosinophils Relative: 3 %
HCT: 32.5 % — ABNORMAL LOW (ref 39.0–52.0)
Hemoglobin: 11.3 g/dL — ABNORMAL LOW (ref 13.0–17.0)
Immature Granulocytes: 0 %
Lymphocytes Relative: 20 %
Lymphs Abs: 1.3 10*3/uL (ref 0.7–4.0)
MCH: 32.9 pg (ref 26.0–34.0)
MCHC: 34.8 g/dL (ref 30.0–36.0)
MCV: 94.8 fL (ref 80.0–100.0)
Monocytes Absolute: 0.4 10*3/uL (ref 0.1–1.0)
Monocytes Relative: 6 %
Neutro Abs: 4.6 10*3/uL (ref 1.7–7.7)
Neutrophils Relative %: 70 %
Platelets: 279 10*3/uL (ref 150–400)
RBC: 3.43 MIL/uL — ABNORMAL LOW (ref 4.22–5.81)
RDW: 13.7 % (ref 11.5–15.5)
WBC: 6.6 10*3/uL (ref 4.0–10.5)
nRBC: 0 % (ref 0.0–0.2)

## 2023-03-12 LAB — GLUCOSE, CAPILLARY: Glucose-Capillary: 252 mg/dL — ABNORMAL HIGH (ref 70–99)

## 2023-03-12 LAB — TROPONIN I (HIGH SENSITIVITY)
Troponin I (High Sensitivity): 33 ng/L — ABNORMAL HIGH (ref <18)
Troponin I (High Sensitivity): 36 ng/L — ABNORMAL HIGH (ref <18)

## 2023-03-12 LAB — BRAIN NATRIURETIC PEPTIDE: B Natriuretic Peptide: 1006.6 pg/mL — ABNORMAL HIGH (ref 0.0–100.0)

## 2023-03-12 LAB — SARS CORONAVIRUS 2 BY RT PCR: SARS Coronavirus 2 by RT PCR: NEGATIVE

## 2023-03-12 MED ORDER — NITROGLYCERIN 2 % TD OINT
1.0000 [in_us] | TOPICAL_OINTMENT | Freq: Once | TRANSDERMAL | Status: AC
  Administered 2023-03-12: 1 [in_us] via TOPICAL
  Filled 2023-03-12: qty 1

## 2023-03-12 MED ORDER — METOPROLOL SUCCINATE ER 100 MG PO TB24
100.0000 mg | ORAL_TABLET | Freq: Every day | ORAL | Status: DC
  Administered 2023-03-12 – 2023-03-15 (×4): 100 mg via ORAL
  Filled 2023-03-12 (×3): qty 1
  Filled 2023-03-12: qty 2

## 2023-03-12 MED ORDER — FUROSEMIDE 10 MG/ML IJ SOLN
40.0000 mg | Freq: Two times a day (BID) | INTRAMUSCULAR | Status: DC
  Administered 2023-03-12 – 2023-03-15 (×6): 40 mg via INTRAVENOUS
  Filled 2023-03-12 (×6): qty 4

## 2023-03-12 MED ORDER — HYDRALAZINE HCL 20 MG/ML IJ SOLN
5.0000 mg | INTRAMUSCULAR | Status: DC | PRN
  Administered 2023-03-14 (×2): 5 mg via INTRAVENOUS
  Filled 2023-03-12 (×3): qty 1

## 2023-03-12 MED ORDER — INSULIN ASPART 100 UNIT/ML IJ SOLN
0.0000 [IU] | Freq: Every day | INTRAMUSCULAR | Status: DC
  Administered 2023-03-12: 3 [IU] via SUBCUTANEOUS
  Administered 2023-03-13: 2 [IU] via SUBCUTANEOUS
  Administered 2023-03-14: 3 [IU] via SUBCUTANEOUS
  Filled 2023-03-12 (×3): qty 1

## 2023-03-12 MED ORDER — ONDANSETRON HCL 4 MG/2ML IJ SOLN: 4.0000 mg | Freq: Four times a day (QID) | INTRAMUSCULAR | Status: DC | PRN

## 2023-03-12 MED ORDER — ASPIRIN 81 MG PO TBEC
81.0000 mg | DELAYED_RELEASE_TABLET | Freq: Every day | ORAL | Status: DC
  Administered 2023-03-12 – 2023-03-15 (×4): 81 mg via ORAL
  Filled 2023-03-12 (×4): qty 1

## 2023-03-12 MED ORDER — BISACODYL 5 MG PO TBEC: 5.0000 mg | DELAYED_RELEASE_TABLET | Freq: Every day | ORAL | Status: DC | PRN

## 2023-03-12 MED ORDER — HYDRALAZINE HCL 20 MG/ML IJ SOLN
5.0000 mg | Freq: Once | INTRAMUSCULAR | Status: AC
  Administered 2023-03-12: 5 mg via INTRAVENOUS
  Filled 2023-03-12: qty 1

## 2023-03-12 MED ORDER — POLYETHYLENE GLYCOL 3350 17 G PO PACK: 17.0000 g | PACK | Freq: Every day | ORAL | Status: DC | PRN

## 2023-03-12 MED ORDER — ONDANSETRON HCL 4 MG PO TABS: 4.0000 mg | ORAL_TABLET | Freq: Four times a day (QID) | ORAL | Status: DC | PRN

## 2023-03-12 MED ORDER — INSULIN ASPART 100 UNIT/ML IJ SOLN
0.0000 [IU] | Freq: Three times a day (TID) | INTRAMUSCULAR | Status: DC
  Administered 2023-03-13: 3 [IU] via SUBCUTANEOUS
  Administered 2023-03-13: 8 [IU] via SUBCUTANEOUS
  Administered 2023-03-14: 5 [IU] via SUBCUTANEOUS
  Administered 2023-03-14: 3 [IU] via SUBCUTANEOUS
  Administered 2023-03-15: 5 [IU] via SUBCUTANEOUS
  Administered 2023-03-15: 3 [IU] via SUBCUTANEOUS
  Filled 2023-03-12 (×6): qty 1

## 2023-03-12 MED ORDER — ROSUVASTATIN CALCIUM 5 MG PO TABS
5.0000 mg | ORAL_TABLET | Freq: Every day | ORAL | Status: DC
  Administered 2023-03-13 – 2023-03-15 (×3): 5 mg via ORAL
  Filled 2023-03-12 (×3): qty 1

## 2023-03-12 MED ORDER — DOCUSATE SODIUM 100 MG PO CAPS
100.0000 mg | ORAL_CAPSULE | Freq: Two times a day (BID) | ORAL | Status: DC
  Administered 2023-03-12 – 2023-03-15 (×5): 100 mg via ORAL
  Filled 2023-03-12 (×6): qty 1

## 2023-03-12 MED ORDER — SODIUM CHLORIDE 0.9% FLUSH
3.0000 mL | Freq: Two times a day (BID) | INTRAVENOUS | Status: DC
  Administered 2023-03-12 – 2023-03-15 (×6): 3 mL via INTRAVENOUS

## 2023-03-12 MED ORDER — ACETAMINOPHEN 650 MG RE SUPP: 650.0000 mg | Freq: Four times a day (QID) | RECTAL | Status: DC | PRN

## 2023-03-12 MED ORDER — FUROSEMIDE 10 MG/ML IJ SOLN
60.0000 mg | Freq: Once | INTRAMUSCULAR | Status: AC
  Administered 2023-03-12: 60 mg via INTRAVENOUS
  Filled 2023-03-12: qty 8

## 2023-03-12 MED ORDER — ASPIRIN 81 MG PO CHEW
324.0000 mg | CHEWABLE_TABLET | Freq: Once | ORAL | Status: AC
  Administered 2023-03-12: 324 mg via ORAL
  Filled 2023-03-12: qty 4

## 2023-03-12 MED ORDER — LOSARTAN POTASSIUM 50 MG PO TABS
50.0000 mg | ORAL_TABLET | Freq: Every day | ORAL | Status: DC
  Administered 2023-03-12 – 2023-03-15 (×4): 50 mg via ORAL
  Filled 2023-03-12 (×4): qty 1

## 2023-03-12 MED ORDER — ENOXAPARIN SODIUM 40 MG/0.4ML IJ SOSY
40.0000 mg | PREFILLED_SYRINGE | INTRAMUSCULAR | Status: DC
  Administered 2023-03-12 – 2023-03-14 (×3): 40 mg via SUBCUTANEOUS
  Filled 2023-03-12 (×3): qty 0.4

## 2023-03-12 MED ORDER — ACETAMINOPHEN 325 MG PO TABS
650.0000 mg | ORAL_TABLET | Freq: Four times a day (QID) | ORAL | Status: DC | PRN
  Filled 2023-03-12: qty 2

## 2023-03-12 MED ORDER — SPIRONOLACTONE 25 MG PO TABS
25.0000 mg | ORAL_TABLET | Freq: Every day | ORAL | Status: DC
  Administered 2023-03-12 – 2023-03-15 (×4): 25 mg via ORAL
  Filled 2023-03-12 (×4): qty 1

## 2023-03-12 MED ORDER — ISOSORBIDE MONONITRATE ER 30 MG PO TB24
30.0000 mg | ORAL_TABLET | Freq: Every day | ORAL | Status: DC
  Administered 2023-03-12 – 2023-03-15 (×4): 30 mg via ORAL
  Filled 2023-03-12 (×4): qty 1

## 2023-03-12 NOTE — ED Triage Notes (Signed)
Pt to ED via [facility] from the store for shortness of breath. Pts SpO2 was 88% on room air. Pt does not wear O2 at home. Pt does have a hx/o CHF. Per EMS pts lungs are clear. Pt reports increased swelling in his legs. Pt was a 20 G IV in left AC. Pt has hx/o HTN. Pt states that he has not taken his blood pressure medication in 3 days. Pts BP was 214/121. CBG 282.

## 2023-03-12 NOTE — ED Provider Notes (Signed)
[facility] Provider Note    Event Date/Time   First MD Initiated Contact with Patient 03/12/23 1458     (approximate)   History   Shortness of Breath   HPI  Chad Zeni Sr. is a 62 y.o. male  with PMHx HFpEF, HTN, here with SOB.  Patient reports that he has had progressively worsening shortness of breath and leg swelling for the last several weeks.  He states that he does not regular take his medication because he gets lightheaded when he takes it.  He states that he has had significant swelling in his legs, now involving his groin as well as his neck.  He has had shortness of breath with orthopnea.  He has had some chest pressure over the last few days, particular with exertion.  No fevers.  No sputum production.      Physical Exam   Triage Vital Signs: ED Triage Vitals  Encounter Vitals Group     BP 03/12/23 1458 (!) 206/100     Systolic BP Percentile --      Diastolic BP Percentile --      Pulse Rate 03/12/23 1458 87     Resp --      Temp --      Temp src --      SpO2 03/12/23 1458 92 %     Weight 03/12/23 1505 191 lb 12.8 oz (87 kg)     Height 03/12/23 1505 5\' 6"  (1.676 m)     Head Circumference --      Peak Flow --      Pain Score --      Pain Loc --      Pain Education --      Exclude from Growth Chart --     Most recent vital signs: Vitals:   03/12/23 1815 03/12/23 1830  BP:  (!) 193/100  Pulse: 83 82  Resp: 20 (!) 29  SpO2: 92% 91%     General: Awake, no distress.  CV:  Good peripheral perfusion.  Regular rate and rhythm. Resp:  Normal work of breathing. Lungs with bilateral rales, mild tachypnea.  Abd:  No distention. No tenderness. Other:  No LE edema.   ED Results / Procedures / Treatments   Labs (all labs ordered are listed, but only abnormal results are displayed) Labs Reviewed  CBC WITH DIFFERENTIAL/PLATELET - Abnormal; Notable for the following components:      Result Value   RBC 3.43 (*)    Hemoglobin  11.3 (*)    HCT 32.5 (*)    All other components within normal limits  COMPREHENSIVE METABOLIC PANEL - Abnormal; Notable for the following components:   Glucose, Bld 186 (*)    Creatinine, Ser 1.34 (*)    Calcium 8.8 (*)    Alkaline Phosphatase 388 (*)    Total Bilirubin 1.5 (*)    All other components within normal limits  BRAIN NATRIURETIC PEPTIDE - Abnormal; Notable for the following components:   B Natriuretic Peptide 1,006.6 (*)    All other components within normal limits  TROPONIN I (HIGH SENSITIVITY) - Abnormal; Notable for the following components:   Troponin I (High Sensitivity) 33 (*)    All other components within normal limits  TROPONIN I (HIGH SENSITIVITY) - Abnormal; Notable for the following components:   Troponin I (High Sensitivity) 36 (*)    All other components within normal limits  SARS CORONAVIRUS 2 BY RT PCR  HEMOGLOBIN A1C  BASIC METABOLIC PANEL  CBC     EKG Sinus rhythm, VR 86. PR 190, QRS 90, QTc 481. No acute ST elevations or depressions. No ischemia or infarct.   RADIOLOGY CXR: Bilateral pulmonary edema   I also independently reviewed and agree with radiologist interpretations.   PROCEDURES:  Critical Care performed: No  .Critical Care  Performed by: Shaune Pollack, MD Authorized by: Shaune Pollack, MD   Critical care provider statement:    Critical care time (minutes):  30   Critical care time was exclusive of:  Separately billable procedures and treating other patients   Critical care was necessary to treat or prevent imminent or life-threatening deterioration of the following conditions:  Cardiac failure, circulatory failure and respiratory failure   Critical care was time spent personally by me on the following activities:  Development of treatment plan with patient or surrogate, discussions with consultants, evaluation of patient's response to treatment, examination of patient, ordering and review of laboratory studies, ordering and  review of radiographic studies, ordering and performing treatments and interventions, pulse oximetry, re-evaluation of patient's condition and review of old charts     MEDICATIONS ORDERED IN ED: Medications  aspirin EC tablet 81 mg (81 mg Oral Given 03/12/23 1810)  isosorbide mononitrate (IMDUR) 24 hr tablet 30 mg (has no administration in time range)  losartan (COZAAR) tablet 50 mg (has no administration in time range)  metoprolol succinate (TOPROL-XL) 24 hr tablet 100 mg (has no administration in time range)  rosuvastatin (CRESTOR) tablet 5 mg (has no administration in time range)  spironolactone (ALDACTONE) tablet 25 mg (has no administration in time range)  furosemide (LASIX) injection 40 mg (has no administration in time range)  insulin aspart (novoLOG) injection 0-15 Units (has no administration in time range)  insulin aspart (novoLOG) injection 0-5 Units (has no administration in time range)  sodium chloride flush (NS) 0.9 % injection 3 mL (has no administration in time range)  acetaminophen (TYLENOL) tablet 650 mg (has no administration in time range)    Or  acetaminophen (TYLENOL) suppository 650 mg (has no administration in time range)  docusate sodium (COLACE) capsule 100 mg (has no administration in time range)  polyethylene glycol (MIRALAX / GLYCOLAX) packet 17 g (has no administration in time range)  bisacodyl (DULCOLAX) EC tablet 5 mg (has no administration in time range)  ondansetron (ZOFRAN) tablet 4 mg (has no administration in time range)    Or  ondansetron (ZOFRAN) injection 4 mg (has no administration in time range)  hydrALAZINE (APRESOLINE) injection 5 mg (has no administration in time range)  enoxaparin (LOVENOX) injection 40 mg (has no administration in time range)  furosemide (LASIX) injection 60 mg (60 mg Intravenous Given 03/12/23 1538)  nitroGLYCERIN (NITROGLYN) 2 % ointment 1 inch (1 inch Topical Given 03/12/23 1532)  aspirin chewable tablet 324 mg (324 mg Oral  Given 03/12/23 1548)  hydrALAZINE (APRESOLINE) injection 5 mg (5 mg Intravenous Given 03/12/23 1655)     IMPRESSION / MDM / ASSESSMENT AND PLAN / ED COURSE  I reviewed the triage vital signs and the nursing notes.                              Differential diagnosis includes, but is not limited to, CHF exacerbation, HTN emergency/urgency, anemia, arrhythmia, electrolyte abnormality  Patient's presentation is most consistent with acute presentation with potential threat to life or bodily function.  The patient is on the cardiac monitor  to evaluate for evidence of arrhythmia and/or significant heart rate changes  62 yo M here with cough, SOB, leg swelling. Exam is c/w CHF exacerbation, likely from med non-adherence and dietary indiscretion. EKG Is nonischemic. Labs reviewed, show mild troponin elevation likely demand related.  CBC shows no leukocytosis or major anemia.  CMP is at baseline.  BNP elevated at 1000.  Chest x-ray shows bilateral pulmonary edema.Marland Kitchen He is hypoxic requiring 3L [place]. IV Lasix and nitroglycerin ointment started. ASA 325 given.    FINAL CLINICAL IMPRESSION(S) / ED DIAGNOSES   Final diagnoses:  Acute on chronic systolic congestive heart failure (HCC)  Hypertensive urgency     Rx / DC Orders   ED Discharge Orders     None        Note:  This document was prepared using Dragon voice recognition software and may include unintentional dictation errors.   Shaune Pollack, MD 03/12/23 [phone]

## 2023-03-12 NOTE — H&P (Signed)
History and Physical    Patient: Chad Good DOB: 1961-07-06 DOA: 03/12/2023 DOS: the patient was seen and examined on 03/12/2023 PCP: Pcp, No  Patient coming from: Home - lives with brothers; Chad Good: [place], [place], [phone]   Chief Complaint: SOB  HPI: Chad Collman Sr. is a 62 y.o. male with medical history significant of DM, HTN, and HLD presenting with SOB.  He has had fluid on him 3 times this year, March, April, and now.  He felt like his neck was flooded and he couldn't breathe and his chest was hurting.  His legs have been swelling.  He takes his medicines "sometimes", opts not to take them.  He thinks he weighs about 160 at baseline.  Substernal chest discomfort is improved.  He doesn't have a heart doctor.  He doesn't have a PCP either.    ER Course:  SOB, hasn't been taking emds, about 40 pounds up with CHF exacerbation.     Review of Systems: As mentioned in the history of present illness. All other systems reviewed and are negative. Past Medical History:  Diagnosis Date   Diabetes mellitus without complication (HCC)    High cholesterol    Hypertension    Stomach ulcer    Past Surgical History:  Procedure Laterality Date   COLONOSCOPY     FEMUR CLOSED REDUCTION     Social History:  reports that he has been smoking cigars and cigarettes. He started smoking about 27 years ago. He has never used smokeless tobacco. He reports that he does not drink alcohol and does not use drugs.  No Known Allergies  Family History  Problem Relation Age of Onset   Diabetes Father     Prior to Admission medications   Medication Sig Start Date End Date Taking? Authorizing Provider  aspirin EC 81 MG tablet Take 1 tablet (81 mg total) by mouth daily. 09/23/22   Gillis Santa, MD  furosemide (LASIX) 20 MG tablet Take 3 tablets (60 mg total) by mouth daily. 09/24/22 12/23/22  Gillis Santa, MD  hydrALAZINE (APRESOLINE) 50 MG tablet Take 1 tablet (50 mg total) by  mouth 3 (three) times daily as needed (systolic BP >150). 09/23/22 10/23/22  Gillis Santa, MD  insulin NPH-regular Human (70-30) 100 UNIT/ML injection Inject 12 Units into the skin 2 (two) times daily with a meal. 09/23/22 08/05/24  Gillis Santa, MD  isosorbide mononitrate (IMDUR) 30 MG 24 hr tablet Take 1 tablet (30 mg total) by mouth daily. 09/23/22 09/23/23  Gillis Santa, MD  losartan (COZAAR) 25 MG tablet Take 2 tablets (50 mg total) by mouth daily. 09/23/22 09/23/23  Gillis Santa, MD  metoprolol succinate (TOPROL-XL) 100 MG 24 hr tablet Take 1 tablet (100 mg total) by mouth daily. Take with or immediately following a meal. 09/24/22 09/24/23  Gillis Santa, MD  rosuvastatin (CRESTOR) 5 MG tablet Take 1 tablet (5 mg total) by mouth daily. 09/23/22 09/23/23  Gillis Santa, MD  spironolactone (ALDACTONE) 25 MG tablet Take 1 tablet (25 mg total) by mouth daily. 09/24/22 09/24/23  Gillis Santa, MD    Physical Exam: Vitals:   03/12/23 1730 03/12/23 1807 03/12/23 1815 03/12/23 1830  BP: (!) 201/105 (!) 192/105  (!) 193/100  Pulse: 79 84 83 82  Resp: (!) 23 19 20  (!) 29  SpO2: 98% 91% 92% 91%  Weight:      Height:       General:  Appears calm and comfortable and is in NAD, on  [place] O2 Eyes:  EOMI, normal lids, iris ENT:  grossly normal hearing, lips & tongue, mmm; poor/absent dentition Neck:  no LAD, masses or thyromegaly Cardiovascular:  RRR, no m/r/g. 2-3+ LE edema.  Respiratory:   CTA bilaterally with no wheezes/rales/rhonchi.  Normal respiratory effort. Abdomen:  soft, NT, ND, + abdominal wall edema Skin:  no rash or induration seen on limited exam Musculoskeletal:  grossly normal tone BUE/BLE, good ROM, no bony abnormality Psychiatric:  grossly normal mood and affect, speech fluent and appropriate, AOx3 Neurologic:  CN 2-12 grossly intact, moves all extremities in coordinated fashion   Radiological Exams on Admission: Independently reviewed - see discussion in A/P where applicable  DG Chest  Portable 1 View  Result Date: 03/12/2023 CLINICAL DATA:  Shortness of breath. EXAM: PORTABLE CHEST 1 VIEW COMPARISON:  September 20, 2022. FINDINGS: The heart size and mediastinal contours are within normal limits. Increased bilateral perihilar and basilar opacities are noted concerning for edema or pneumonia. Small right pleural effusion is noted. The visualized skeletal structures are unremarkable. IMPRESSION: Bilateral lung opacities concerning for edema or pneumonia with small right pleural effusion. Electronically Signed   By: Lupita Raider M.D.   On: 03/12/2023 16:38    EKG: Independently reviewed.  NSR with rate 86; LVH; nonspecific ST changes with no evidence of acute ischemia   Labs on Admission: I have personally reviewed the available labs and imaging studies at the time of the admission.  Pertinent labs:    Glucose 186 BUN 20/Creatinine 1.34/GFR >60 - stable BNP 1006.6 HS troponin 33, 36 WBC 6.6 Hgb 11.3 COVID negative   Assessment and Plan: Active Problems:   HTN (hypertension)   Acute on chronic diastolic CHF (congestive heart failure) (HCC)   Diabetes mellitus, type II (HCC)   Dyslipidemia   Tobacco dependence   DNR (do not resuscitate)    Acute on chronic diastolic CHF Patient with poorly controlled HTN (does not take medications) presenting with significant volume overload Previously admitted in 09/2022, weight was 6 kg up during that hospitalization compared to now  CXR consistent with pulmonary edema Elevated BNP  With elevated BNP and abnl CXR, acute decompensated CHF seems probable as diagnosis Will admit, as per the Emergency HF Mortality Risk Grade.  The patient has: severe pulmonary edema requiring new O2 therapy Will request echocardiogram CHF order set utilized Cardiology consulted via Inbox message Was given Lasix 40 mg x 1 in ER and will repeat with 40 mg IV BID Continue [place] O2 for now Mildly elevated HS troponin is likely related to demand ischemia;  doubt ACS based on symptoms  HTN HTN crisis may also be in play here - patient does not take home medications routinely and has marked HTN at this time Resume home medicaitons - Imdur, losartan, Toprol XL, and spironolactone Will also add prn hydralazine  HLD Continue Crestor  DM Last A1c was 7.1, will recheck Hold 70/30 for now Will cover with moderate-scale SSI for now  Tobacco dependence Continues to smoke cigars Encourage cessation.    DNR I have discussed code status with the patient and he would not desire resuscitation and would prefer to die a natural death should that situation arise. Denies depression, despite his willful neglect of medications     Advance Care Planning:   Code Status: Limited: Do not attempt resuscitation (DNR) -DNR-LIMITED -Do Not Intubate/DNI    Consults: Cardiology; PT/OT; nutrition  DVT Prophylaxis: Lovenox  Family Communication: None present; he declined to have me  call family at the time of admission  Severity of Illness: The appropriate patient status for this patient is INPATIENT. Inpatient status is judged to be reasonable and necessary in order to provide the required intensity of service to ensure the patient's safety. The patient's presenting symptoms, physical exam findings, and initial radiographic and laboratory data in the context of their chronic comorbidities is felt to place them at high risk for further clinical deterioration. Furthermore, it is not anticipated that the patient will be medically stable for discharge from the hospital within 2 midnights of admission.   * I certify that at the point of admission it is my clinical judgment that the patient will require inpatient hospital care spanning beyond 2 midnights from the point of admission due to high intensity of service, high risk for further deterioration and high frequency of surveillance required.*  Author: Jonah Blue, MD 03/12/2023 6:47 PM  For on call review  [email or web address].

## 2023-03-13 ENCOUNTER — Inpatient Hospital Stay
Admit: 2023-03-13 | Discharge: 2023-03-13 | Disposition: A | Discharge status: Home / Self Care | Payer: BLUE CROSS/BLUE SHIELD | Attending: Internal Medicine | Admitting: Internal Medicine

## 2023-03-13 DIAGNOSIS — I5033 Acute on chronic diastolic (congestive) heart failure: Secondary | ICD-10-CM | POA: Diagnosis not present

## 2023-03-13 LAB — BASIC METABOLIC PANEL
Anion gap: 7 (ref 5–15)
BUN: 24 mg/dL — ABNORMAL HIGH (ref 8–23)
CO2: 29 mmol/L (ref 22–32)
Calcium: 8.3 mg/dL — ABNORMAL LOW (ref 8.9–10.3)
Chloride: 105 mmol/L (ref 98–111)
Creatinine, Ser: 1.55 mg/dL — ABNORMAL HIGH (ref 0.61–1.24)
GFR, Estimated: 51 mL/min — ABNORMAL LOW (ref >60)
Glucose, Bld: 219 mg/dL — ABNORMAL HIGH (ref 70–99)
Potassium: 3.7 mmol/L (ref 3.5–5.1)
Sodium: 141 mmol/L (ref 135–145)

## 2023-03-13 LAB — CBC
HCT: 27.7 % — ABNORMAL LOW (ref 39.0–52.0)
Hemoglobin: 9.6 g/dL — ABNORMAL LOW (ref 13.0–17.0)
MCH: 33 pg (ref 26.0–34.0)
MCHC: 34.7 g/dL (ref 30.0–36.0)
MCV: 95.2 fL (ref 80.0–100.0)
Platelets: 260 10*3/uL (ref 150–400)
RBC: 2.91 MIL/uL — ABNORMAL LOW (ref 4.22–5.81)
RDW: 14 % (ref 11.5–15.5)
WBC: 6 10*3/uL (ref 4.0–10.5)
nRBC: 0 % (ref 0.0–0.2)

## 2023-03-13 LAB — GLUCOSE, CAPILLARY
Glucose-Capillary: 262 mg/dL — ABNORMAL HIGH (ref 70–99)
Glucose-Capillary: 108 mg/dL — ABNORMAL HIGH (ref 70–99)
Glucose-Capillary: 177 mg/dL — ABNORMAL HIGH (ref 70–99)
Glucose-Capillary: 203 mg/dL — ABNORMAL HIGH (ref 70–99)

## 2023-03-13 LAB — HEMOGLOBIN A1C
Hgb A1c MFr Bld: 6.8 % — ABNORMAL HIGH (ref 4.8–5.6)
Mean Plasma Glucose: 148.46 mg/dL

## 2023-03-13 NOTE — Progress Notes (Signed)
OT Cancellation Note  Patient Details Name: Chad Fillinger Sr. MRN: [id number] DOB: Jul 22, 1960   Cancelled Treatment:    Reason Eval/Treat Not Completed: OT screened, no needs identified, will sign off. Consult received, chart reviewed. Spoke with PT who notes pt is independent and no acute OT needs indicated at this time. Will sign off. Please re-consult if additional acute OT needs arise.   Arman Filter., MPH, MS, OTR/L ascom [phone] 03/13/23, 8:55 AM

## 2023-03-13 NOTE — Discharge Instructions (Addendum)
Some PCP options in [place] area- not a comprehensive list  [facility]- [phone] Corinda Gubler- [phone] [facility]- [phone] [facility]- [phone] [facility]- [phone] Lutricia Horsfall- [phone]  or [facility] Health Physician Referral Line [phone]   [facility] Information  You are encouraged to call the [facility] and arrange an application appointment to be screened for service to get set up with a physician for primary care  You may print the application and take with you to the appointment. At this web address [email or web address]  Please Call [facility] for appointment [phone] [address] [place], [place] [address]  Office Hours Monday: Closed Tuesday: 9:00am - 4:00pm Wednesday: 9:00am - 4:00pm Thursday: 9:00am - 8:00pm Friday: Closed  Endocrinology 2nd Thursday of the Month: 5:00pm - 8:00p  Rheumatology/Orthopedic Clinic By appointment only.  Contact for availability.  Services Not Covered Obstetrics Gastrointestinal/Liver Disease  NOTE: With your verbal agreement, a financial navigator will reach out to you, even after discharge to discuss your eligibility for Medicaid.   Heart Healthy, Consistent Carbohydrate Nutrition Therapy   A heart-healthy and consistent carbohydrate diet is recommended to manage heart disease and diabetes. To follow a heart-healthy and consistent carbohydrate diet, Eat a balanced diet with whole grains, fruits and vegetables, and lean protein sources.  Choose heart-healthy unsaturated fats. Limit saturated fats, trans fats, and cholesterol intake. Eat more plant-based or vegetarian meals using beans and soy foods for protein.  Eat whole, unprocessed foods to limit the amount of sodium (salt) you eat.  Choose a consistent amount of carbohydrate at each meal and snack. Limit refined carbohydrates especially sugar,  sweets and sugar-sweetened beverages.  If you drink alcohol, do so in moderation: one serving per day (women) and two servings per day (men). o One serving is equivalent to 12 ounces beer, 5 ounces wine, or 1.5 ounces distilled spirits  Tips Tips for Choosing Heart-Healthy Fats Choose lean protein and low-fat dairy foods to reduce saturated fat intake. Saturated fat is usually found in animal-based protein and is associated with certain health risks. Saturated fat is the biggest contributor to raise low-density lipoprotein (LDL) cholesterol levels. Research shows that limiting saturated fat lowers unhealthy cholesterol levels. Eat no more than 7% of your total calories each day from saturated fat. Ask your RDN to help you determine how much saturated fat is right for you. There are many foods that do not contain large amounts of saturated fats. Swapping these foods to replace foods high in saturated fats will help you limit the saturated fat you eat and improve your cholesterol levels. You can also try eating more plant-based or vegetarian meals. Instead of. Try:  Whole milk, cheese, yogurt, and ice cream 1% or skim milk, low-fat cheese, non-fat yogurt, and low-fat ice cream  Fatty, marbled beef and pork Lean beef, pork, or venison  Poultry with skin Poultry without skin  Butter, stick margarine Reduced-fat, whipped, or liquid spreads  Coconut oil, palm oil Liquid vegetable oils: corn, canola, olive, soybean and safflower oils   Avoid foods that contain trans fats. Trans fats increase levels of LDL-cholesterol. Hydrogenated fat in processed foods is the main source of trans fats in foods.  Trans fats can be found in stick margarine, shortening, processed sweets, baked goods, some fried foods, and packaged foods made with hydrogenated oils. Avoid foods with "partially hydrogenated oil" on the ingredient list such as: cookies, pastries, baked goods, biscuits, crackers, microwave popcorn, and frozen  dinners. Choose foods with heart healthy fats. Polyunsaturated  and monounsaturated fat are unsaturated fats that may help lower your blood cholesterol level when used in place of saturated fat in your diet. Ask your RDN about taking a dietary supplement with plant sterols and stanols to help lower your cholesterol level. Research shows that substituting saturated fats with unsaturated fats is beneficial to cholesterol levels. Try these easy swaps: Instead of. Try:  Butter, stick margarine, or solid shortening Reduced-fat, whipped, or liquid spreads  Beef, pork, or poultry with skin Fish and seafood  Chips, crackers, snack foods Raw or unsalted nuts and seeds or nut butters Hummus with vegetables Avocado on toast  Coconut oil, palm oil Liquid vegetable oils: corn, canola, olive, soybean and safflower oils  Limit the amount of cholesterol you eat to less than 200 milligrams per day. Cholesterol is a substance carried through the bloodstream via lipoproteins, which are known as "transporters" of fat. Some body functions need cholesterol to work properly, but too much cholesterol in the bloodstream can damage arteries and build up blood vessel linings (which can lead to heart attack and stroke). You should eat less than 200 milligrams cholesterol per day. People respond differently to eating cholesterol. There is no test available right now that can figure out which people will respond more to dietary cholesterol and which will respond less. For individuals with high intake of dietary cholesterol, different types of increase (none, small, moderate, large) in LDL-cholesterol levels are all possible.  Food sources of cholesterol include egg yolks and organ meats such as liver, gizzards. Limit egg yolks to two to four per week and avoid organ meats like liver and gizzards to control cholesterol intake. Tips for Choosing Heart-Healthy Carbohydrates Consume a consistent amount of carbohydrate It is  important to eat foods with carbohydrates in moderation because they impact your blood glucose level. Carbohydrates can be found in many foods such as: Grains (breads, crackers, rice, pasta, and cereals)  Starchy Vegetables (potatoes, corn, and peas)  Beans and legumes  Milk, soy milk, and yogurt  Fruit and fruit juice  Sweets (cakes, cookies, ice cream, jam and jelly) Your RDN will help you set a goal for how many carbohydrate servings to eat at your meals and snacks. For many adults, eating 3 to 5 servings of carbohydrate foods at each meal and 1 or 2 carbohydrate servings for each snack works well.  Check your blood glucose level regularly. It can tell you if you need to adjust when you eat carbohydrates. Choose foods rich in viscous (soluble) fiber Viscous, or soluble, is found in the walls of plant cells. Viscous fiber is found only in plant-based foods. Eating foods with fiber helps to lower your unhealthy cholesterol and keep your blood glucose in range  Rich sources of viscous fiber include vegetables (asparagus, Brussels sprouts, sweet potatoes, turnips) fruit (apricots, mangoes, oranges), legumes, and whole grains (barley, oats, and oat bran).  As you increase your fiber intake gradually, also increase the amount of water you drink. This will help prevent constipation.  If you have difficulty achieving this goal, ask your RDN about fiber laxatives. Choose fiber supplements made with viscous fibers such as psyllium seed husks or methylcellulose to help lower unhealthy cholesterol.  Limit refined carbohydrates  There are three types of carbohydrates: starches, sugar, and fiber. Some carbohydrates occur naturally in food, like the starches in rice or corn or the sugars in fruits and milk. Refined carbohydrates--foods with high amounts of simple sugars--can raise triglyceride levels. High triglyceride levels are associated with  coronary heart disease. Some examples of refined carbohydrate foods  are table sugar, sweets, and beverages sweetened with added sugar. Tips for Reducing Sodium (Salt) Although sodium is important for your body to function, too much sodium can be harmful for people with high blood pressure. As sodium and fluid buildup in your tissues and bloodstream, your blood pressure increases. High blood pressure may cause damage to other organs and increase your risk for a stroke. Even if you take a pill for blood pressure or a water pill (diuretic) to remove fluid, it is still important to have less salt in your diet. Ask your doctor and RDN what amount of sodium is right for you. Avoid processed foods. Eat more fresh foods.  Fresh fruits and vegetables are naturally low in sodium, as well as frozen vegetables and fruits that have no added juices or sauces.  Fresh meats are lower in sodium than processed meats, such as bacon, sausage, and hotdogs. Read the nutrition label or ask your butcher to help you find a fresh meat that is low in sodium. Eat less salt--at the table and when cooking.  A single teaspoon of table salt has 2,300 mg of sodium.  Leave the salt out of recipes for pasta, casseroles, and soups.  Ask your RDN how to cook your favorite recipes without sodium Be a smart shopper.  Look for food packages that say "salt-free" or "sodium-free." These items contain less than 5 milligrams of sodium per serving.  "Very low-sodium" products contain less than 35 milligrams of sodium per serving.  "Low-sodium" products contain less than 140 milligrams of sodium per serving.  Beware for "Unsalted" or "No Added Salt" products. These items may still be high in sodium. Check the nutrition label. Add flavors to your food without adding sodium.  Try lemon juice, lime juice, fruit juice or vinegar.  Dry or fresh herbs add flavor. Try basil, bay leaf, dill, rosemary, parsley, sage, dry mustard, nutmeg, thyme, and paprika.  Pepper, red pepper flakes, and cayenne pepper can add spice  t your meals without adding sodium. Hot sauce contains sodium, but if you use just a drop or two, it will not add up to much.  Buy a sodium-free seasoning blend or make your own at home. Additional Lifestyle Tips Achieve and maintain a healthy weight. Talk with your RDN or your doctor about what is a healthy weight for you. Set goals to reach and maintain that weight.  To lose weight, reduce your calorie intake along with increasing your physical activity. A weight loss of 10 to 15 pounds could reduce LDL-cholesterol by 5 milligrams per deciliter. Participate in physical activity. Talk with your health care team to find out what types of physical activity are best for you. Set a plan to get about 30 minutes of exercise on most days.  Foods Recommended Food Group Foods Recommended  Grains Whole grain breads and cereals, including whole wheat, barley, rye, buckwheat, corn, teff, quinoa, millet, amaranth, brown or wild rice, sorghum, and oats Pasta, especially whole wheat or other whole grain types  [employer] [place] [employer], quinoa or wild rice Whole grain crackers, bread, rolls, pitas Home-made bread with reduced-sodium baking soda  Protein Foods Lean cuts of beef and pork (loin, leg, round, extra lean hamburger)  Skinless Press photographer and other wild game Dried beans and peas Nuts and nut butters Meat alternatives made with soy or textured vegetable protein  Egg whites or egg substitute Cold cuts made with lean meat or soy  protein  Dairy Nonfat (skim), low-fat, or 1%-fat milk  Nonfat or low-fat yogurt or cottage cheese Fat-free and low-fat cheese  Vegetables Fresh, frozen, or canned vegetables without added fat or salt   Fruits Fresh, frozen, canned, or dried fruit   Oils Unsaturated oils (corn, olive, peanut, soy, sunflower, canola)  Soft or liquid margarines and vegetable oil spreads  Salad dressings Seeds and nuts  Avocado   Foods Not Recommended Food Group Foods Not Recommended   Grains Breads or crackers topped with salt Cereals (hot or cold) with more than 300 mg sodium per serving Biscuits, cornbread, and other "quick" breads prepared with baking soda Bread crumbs or stuffing mix from a store High-fat bakery products, such as doughnuts, biscuits, croissants, danish pastries, pies, cookies Instant cooking foods to which you add hot water and stir--potatoes, noodles, rice, etc. Packaged starchy foods--seasoned noodle or rice dishes, stuffing mix, macaroni and cheese dinner Snacks made with partially hydrogenated oils, including chips, cheese puffs, snack mixes, regular crackers, butter-flavored popcorn  Protein Foods Higher-fat cuts of meats (ribs, t-bone steak, regular hamburger) Bacon, sausage, or hot dogs Cold cuts, such as salami or bologna, deli meats, cured meats, corned beef Organ meats (liver, brains, gizzards, sweetbreads) Poultry with skin Fried or smoked meat, poultry, and fish Whole eggs and egg yolks (more than 2-4 per week) Salted legumes, nuts, seeds, or nut/seed butters Meat alternatives with high levels of sodium (>300 mg per serving) or saturated fat (>5 g per serving)  Dairy Whole milk,?2% fat milk, buttermilk Whole milk yogurt or ice cream Cream Half-&-half Cream cheese Sour cream Cheese  Vegetables Canned or frozen vegetables with salt, fresh vegetables prepared with salt, butter, cheese, or cream sauce Fried vegetables Pickled vegetables such as olives, pickles, or sauerkraut  Fruits Fried fruits Fruits served with butter or cream  Oils Butter, stick margarine, shortening Partially hydrogenated oils or trans fats Tropical oils (coconut, palm, palm kernel oils)  Other Candy, sugar sweetened soft drinks and desserts Salt, sea salt, garlic salt, and seasoning mixes containing salt Bouillon cubes Ketchup, barbecue sauce, Worcestershire sauce, soy sauce, teriyaki sauce Miso Salsa Pickles, olives, relish   Heart Healthy Consistent  Carbohydrate Vegetarian (Lacto-Ovo) Sample 1-Day Menu  Breakfast 1 cup oatmeal, cooked (2 carbohydrate servings)   cup blueberries (1 carbohydrate serving)  11 almonds, without salt  1 cup 1% milk (1 carbohydrate serving)  1 cup coffee  Morning Snack 1 cup fat-free plain yogurt (1 carbohydrate serving)  Lunch 1 whole wheat bun (1 carbohydrate servings)  1 black bean burger (1 carbohydrate servings)  1 slice cheddar cheese, low sodium  2 slices tomatoes  2 leaves lettuce  1 teaspoon mustard  1 small pear (1 carbohydrate servings)  1 cup green tea, unsweetened  Afternoon Snack 1/3 cup trail mix with nuts, seeds, and raisins, without salt (1 carbohydrate servinga)  Evening Meal  cup meatless chicken  2/3 cup brown rice, cooked (2 carbohydrate servings)  1 cup broccoli, cooked (2/3 carbohydrate serving)   cup carrots, cooked (1/3 carbohydrate serving)  2 teaspoons olive oil  1 teaspoon balsamic vinegar  1 whole wheat dinner roll (1 carbohydrate serving)  1 teaspoon margarine, soft, tub  1 cup 1% milk (1 carbohydrate serving)  Evening Snack 1 extra small banana (1 carbohydrate serving)  1 tablespoon peanut butter   Heart Healthy Consistent Carbohydrate Vegan Sample 1-Day Menu  Breakfast 1 cup oatmeal, cooked (2 carbohydrate servings)   cup blueberries (1 carbohydrate serving)  11 almonds, without salt  1 cup soymilk fortified with calcium, vitamin B12, and vitamin D  1 cup coffee  Morning Snack 6 ounces soy yogurt (1 carbohydrate servings)  Lunch 1 whole wheat bun(1 carbohydrate servings)  1 black bean burger (1 carbohydrate serving)  2 slices tomatoes  2 leaves lettuce  1 teaspoon mustard  1 small pear (1 carbohydrate servings)  1 cup green tea, unsweetened  Afternoon Snack 1/3 cup trail mix with nuts, seeds, and raisins, without salt (1 carbohydrate servings)  Evening Meal  cup meatless chicken  2/3 cup brown rice, cooked (2 carbohydrate servings)  1 cup  broccoli, cooked (2/3 carbohydrate serving)   cup carrots, cooked (1/3 carbohydrate serving)  2 teaspoons olive oil  1 teaspoon balsamic vinegar  1 whole wheat dinner roll (1 carbohydrate serving)  1 teaspoon margarine, soft, tub  1 cup soymilk fortified with calcium, vitamin B12, and vitamin D  Evening Snack 1 extra small banana (1 carbohydrate serving)  1 tablespoon peanut butter    Heart Healthy Consistent Carbohydrate Sample 1-Day Menu  Breakfast 1 cup cooked oatmeal (2 carbohydrate servings)  3/4 cup blueberries (1 carbohydrate serving)  1 ounce almonds  1 cup skim milk (1 carbohydrate serving)  1 cup coffee  Morning Snack 1 cup sugar-free nonfat yogurt (1 carbohydrate serving)  Lunch 2 slices whole-wheat bread (2 carbohydrate servings)  2 ounces lean [place] breast  1 ounce low-fat Swiss cheese  1 teaspoon mustard  1 slice tomato  1 lettuce leaf  1 small pear (1 carbohydrate serving)  1 cup skim milk (1 carbohydrate serving)  Afternoon Snack 1 ounce trail mix with unsalted nuts, seeds, and raisins (1 carbohydrate serving)  Evening Meal 3 ounces salmon  2/3 cup cooked brown rice (2 carbohydrate servings)  1 teaspoon soft margarine  1 cup cooked broccoli with 1/2 cup cooked carrots (1 carbohydrate serving  Carrots, cooked, boiled, drained, without salt  1 cup lettuce  1 teaspoon olive oil with vinegar for dressing  1 small whole grain roll (1 carbohydrate serving)  1 teaspoon soft margarine  1 cup unsweetened tea  Evening Snack 1 extra-small banana (1 carbohydrate serving)  Copyright 2020  Academy of [employer] Dietetics. All rights reserved.

## 2023-03-13 NOTE — TOC Progression Note (Addendum)
Transition of Care (TOC) - Progression Note    Patient Details  Name: Chad Ty Sr. MRN: [id number] Date of Birth: 03/08/1961  Transition of Care [facility]) CM/SW Contact  Bing Quarry, RN Phone Number: 03/13/2023, 10:26 AM  Clinical Narrative: 03/13/23: Patient admitted via [facility] ED with HTN urgency, legs swelling, SOB.  Dx: w/HTN, CHF, Acute on Chronic/Systolic and Diastolic HF.  Medication compliance issues: Patient had not taken BP medications in 3 days prior to this admission. Per Provider notes, he takes occasionally, said it makes him dizzy. Patient also stated financial issues paying for medications even with insurance.  PMH significant for: DM, HTN, and HLD presenting with worsening SOB.   CBG was elevated on admission. (282).  HTN of 214/121 in triage notes. Patient is noted to not have a PCP or a cardiologist.  No current PT recommendations per evaluations.  Of note: Prior admissions for similar dx: [facility] 4//12-4/23/24 for HTN/CHF and [facility] 3/17-3/20/24 for Acute respiratory failure, general edema, pleural effusion.  1 ED visit past 30 days 3 ED visits last 12 mo. 1 IP/ last 30 days.  3 IP last 12 mo (2 [facility], 1 [facility]). TOC for SNF placement but no PT/SNF recommendations by PT.  PCP options list, HTN, Dash Diet, HF, BP log educational materials added to AVS via Care Coordination.   TOC RN CM spoke with patient regarding obtaining a PCP, medications, transportation issues. Patient is retired and has transportation, no food or housing issues revealed, but does have issues paying for medications. EMR says he has Medicaid Pre-Paid but patient says he does not. States when he tried to have a follow up visit at [facility] but he got a call saying they did not accept BSBS Non Participating he had purchased via [employer].   Obtaining a PCP and being able to pay for medications seems a significant barrier for compliance with medication regimen for current acute and  chronic condition, and to prevent any complications or worsening of those diagnosed medical issues.   [facility] has been utilized in the past to get medications.   Pt.stated was agreeable to RN CM contacting financial navigator as well to see if patient may be eligible for Medicaid. (Contacted via email)  Patient has had 3 IP stays in last several months and needs post acute resources to help prevent complications of diagnosed medical conditions.   RN CM informed him of educational materials attached to AVS including PCP options list and area clinics, [facility], and [employer].   Patient was appreciative of the information and demonstrated verbal understanding of resources being provided.   Gabriel Cirri MSN RN CM  Transitions of Care Department [facility] Direct Dial: [phone] (Weekends Only)        Expected Discharge Plan and Services                                               Social Determinants of Health (SDOH) Interventions SDOH Screenings   Food Insecurity: No Food Insecurity (10/19/2022)   Received from [facility], [facility]  Transportation Needs: No Transportation Needs (12/09/2022)   Received from [facility], [facility]  Utilities: Not At Risk (10/16/2022)   Received from [facility], [facility]  Depression [phone]): Low Risk  (  09/08/2021)  Financial Resource Strain: Medium Risk (10/19/2022)   Received from [facility], [facility]  Physical Activity: Inactive (12/09/2022)   Received from [facility], [facility]  Social Connections: Socially Isolated (12/09/2022)   Received from [facility], [facility]  Stress: Stress Concern Present (12/09/2022)   Received from [facility], [facility]  Tobacco Use: High Risk (03/12/2023)  Health Literacy: Inadequate Health Literacy (12/09/2022)   Received from [facility], [facility]    Readmission Risk Interventions     No data to display

## 2023-03-13 NOTE — Evaluation (Signed)
Physical Therapy Evaluation Patient Details Name: Chad Wittenborn Sr. MRN: [id number] DOB: [birth year] Today's Date: 03/13/2023  History of Present Illness  presented to ER secondary to SOB, LE edema; admitted for management of acute/chronic CHF  Clinical Impression  Patient resting in bed upon arrival to session; alert and oriented, follows commands and agreeable to participation with treatment session.  Denies pain; does endorse improvement in LE edema and overall respiratory status since admission.  Bilat UE/LE strength and ROM grossly symmetrical and WFL; no focal weakness appreciated.  Does report baseline neuropathy mid-calf distally bilat; unchanged with this admission.  Able to complete bed mobility indep; sit/stand, basic transfers and gait (200') without assist device, sup/mod indep.  Demonstrates reciprocal stepping pattern with good trunk rotation, good arm swing. Steady cadence without bucking, LOB or safety concern. Minimal/no SOB with gait efforts, but does desat to 85-86% on RA with gait, requiring 45-60 seconds of seated rest and pursed lip breathing for recovery to 89-90%. Supplemental O2 at 2L reapplied end of session to maintain >92%; will continue efforts to monitor/wean as appropriate  Would benefit from skilled PT to address above deficits and promote optimal return to PLOF; will plan to see for 1-2 additional visits to assess stairs, continue education on activity pacing/energy conservation and monitor ability to further wean off O2 with mobility efforts.  Anticipate no formal PT needs upon discharge.      If plan is discharge home, recommend the following: A little help with walking and/or transfers   Can travel by private vehicle        Equipment Recommendations None recommended by PT  Recommendations for Other Services       Functional Status Assessment Patient has had a recent decline in their functional status and demonstrates the ability to make significant  improvements in function in a reasonable and predictable amount of time.     Precautions / Restrictions Precautions Precautions: None Restrictions Weight Bearing Restrictions: No      Mobility  Bed Mobility Overal bed mobility: Independent                  Transfers Overall transfer level: Modified independent Equipment used: None                    Ambulation/Gait Ambulation/Gait assistance: Supervision, Modified independent (Device/Increase time) Gait Distance (Feet): 200 Feet Assistive device: None   Gait velocity: 10' walk time, 6-7 seconds     General Gait Details: reciprocal stepping pattern with good trunk rotation, good arm swing.  Steady cadence without bucking, LOB or safety concern.  Minimal/no SOB with gait efforts, but does desat to 85-86% on RA with gait, requiring 45-60 seconds of seated rest and pursed lip breathing for recovery to 89-90%.  Supplemental O2 at 2L reapplied end of session to maintain >92%; will continue efforts to monitor/wean as appropriate  Stairs            Wheelchair Mobility     Tilt Bed    Modified Rankin (Stroke Patients Only)       Balance Overall balance assessment: Needs assistance Sitting-balance support: No upper extremity supported, Feet supported Sitting balance-Leahy Scale: Normal     Standing balance support: No upper extremity supported Standing balance-Leahy Scale: Normal                               Pertinent Vitals/Pain Pain Assessment Pain Assessment: No/denies pain  Home Living Family/patient expects to be discharged to:: Private residence Living Arrangements: Other relatives Available Help at Discharge: Family Type of Home: Mobile home Home Access: Stairs to enter Entrance Stairs-Rails: Can reach both Entrance Stairs-Number of Steps: 3   Home Layout: One level        Prior Function Prior Level of Function : Independent/Modified Independent              Mobility Comments: Indep with ADls, household and community mobilization without assist device; 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  Denies fall history; no home O2.       Extremity/Trunk Assessment   Upper Extremity Assessment Upper Extremity Assessment: Overall WFL for tasks assessed    Lower Extremity Assessment Lower Extremity Assessment: Overall WFL for tasks assessed       Communication      Cognition Arousal: Alert Behavior During Therapy: WFL for tasks assessed/performed Overall Cognitive Status: Within Functional Limits for tasks assessed                                          General Comments      Exercises     Assessment/Plan    PT Assessment Patient needs continued PT services  PT Problem List Decreased activity tolerance;Decreased mobility;Cardiopulmonary status limiting activity       PT Treatment Interventions DME instruction;Gait training;Stair training;Functional mobility training;Therapeutic activities;Therapeutic exercise;Balance training;Patient/family education    PT Goals (Current goals can be found in the Care Plan section)  Acute Rehab PT Goals Patient Stated Goal: to go home PT Goal Formulation: With patient Time For Goal Achievement: 03/27/23 Potential to Achieve Goals: Good Additional Goals Additional Goal #1: Indep with strategies for activity pacing and energy conservation for safety/indep with functional activities    Frequency Min 1X/week     Co-evaluation               AM-PAC PT "6 Clicks" Mobility  Outcome Measure Help needed turning from your back to your side while in a flat bed without using bedrails?: None Help needed moving from lying on your back to sitting on the side of a flat bed without using bedrails?: None Help needed moving to and from a bed to a chair (including a wheelchair)?: None Help needed standing up from a chair using your arms (e.g., wheelchair or bedside chair)?:  None Help needed to walk in hospital room?: None Help needed climbing 3-5 steps with a railing? : A Little 6 Click Score: 23    End of Session   Activity Tolerance: Patient tolerated treatment well Patient left: in bed;with call bell/phone within reach Nurse Communication: Mobility status PT Visit Diagnosis: Difficulty in walking, not elsewhere classified (R26.2)    Time: 1191-4782 PT Time Calculation (min) (ACUTE ONLY): 10 min   Charges:   PT Evaluation $PT Eval Low Complexity: 1 Low   PT General Charges $$ ACUTE PT VISIT: 1 Visit        Lakaisha Danish H. Manson Passey, PT, DPT, NCS 03/13/23, 8:47 AM [phone]

## 2023-03-13 NOTE — Progress Notes (Addendum)
PROGRESS NOTE    Chad Good.  ZOX:[id number] DOB: October 15, 1960 DOA: 03/12/2023 PCP: Pcp, No  Assessment & Plan:   Active Problems:   HTN (hypertension)   Acute on chronic diastolic CHF (congestive heart failure) (HCC)   Diabetes mellitus, type II (HCC)   Dyslipidemia   Tobacco dependence   DNR (do not resuscitate)  Assessment and Plan: Acute on chronic diastolic CHF: continue on lasix, metoprolol, losartan, aldactone. Strict I/Os. Echo ordered. Elevated BNP & CXR shows pulmonary edema. Does not monitor fluid or salt intake at home. Needs CHF education    Acute hypoxic respiratory failure: secondary to above. Continue on supplemental oxygen and wean as tolerated   HTN: poorly controlled secondary to medication noncompliance. Restart imdur, metoprolol, aldactone, losartan  HLD: continue on statin   DM2: fair control. Continue on SSI w/ accuchecks. HbA1c pending    Tobacco dependence: received cessation counseling already   Overweight: BMI 29.8. Would benefit from weight loss   ACD: likely secondary to DM2. No need for a transfusion currently     DVT prophylaxis: lovenox  Code Status: DNR Family Communication: Disposition Plan: likely d/c back home  Level of care: Progressive Status is: Inpatient Remains inpatient appropriate because: severity of illness    Consultants:    Procedures:   Antimicrobials:   Subjective: Pt c/o shortness of breath & swelling on legs  Objective: Vitals:   03/13/23 0407 03/13/23 0500 03/13/23 0836 03/13/23 0837  BP: 124/69  (!) 159/87   Pulse: 72  74   Resp:   18   Temp: 98.1 F (36.7 C)  98.5 F (36.9 C)   TempSrc: Oral     SpO2: 99%  98% (!) 86%  Weight:  83.8 kg    Height:        Intake/Output Summary (Last 24 hours) at 03/13/2023 0859 Last data filed at 03/13/2023 0837 Gross per 24 hour  Intake 723 ml  Output 4000 ml  Net -3277 ml   Filed Weights   03/12/23 1505 03/13/23 0500  Weight: 87 kg 83.8 kg     Examination:  General exam: Appears calm and comfortable  Respiratory system: course breath sounds b/l Cardiovascular system: S1 & S2+. No rubs, gallops or clicks. 2+ pitting edema of b/l LE Gastrointestinal system: Abdomen is nondistended, soft and nontender.  Normal bowel sounds heard. Central nervous system: Alert and oriented. Moves all extremities Psychiatry: Judgement and insight appears at baseline. Mood & affect appropriate.     Data Reviewed: I have personally reviewed following labs and imaging studies  CBC: Recent Labs  Lab 03/12/23 1543 03/13/23 0658  WBC 6.6 6.0  NEUTROABS 4.6  --   HGB 11.3* 9.6*  HCT 32.5* 27.7*  MCV 94.8 95.2  PLT 279 260   Basic Metabolic Panel: Recent Labs  Lab 03/12/23 1543 03/13/23 0658  NA 139 141  K 4.4 3.7  CL 104 105  CO2 25 29  GLUCOSE 186* 219*  BUN 20 24*  CREATININE 1.34* 1.55*  CALCIUM 8.8* 8.3*   GFR: Estimated Creatinine Clearance: 50.8 mL/min (A) (by C-G formula based on SCr of 1.55 mg/dL (H)). Liver Function Tests: Recent Labs  Lab 03/12/23 1543  AST 41  ALT 33  ALKPHOS 388*  BILITOT 1.5*  PROT 7.4  ALBUMIN 3.5   No results for input(s): "LIPASE", "AMYLASE" in the last 168 hours. No results for input(s): "AMMONIA" in the last 168 hours. Coagulation Profile: No results for input(s): "INR", "PROTIME" in the last  168 hours. Cardiac Enzymes: No results for input(s): "CKTOTAL", "CKMB", "CKMBINDEX", "TROPONINI" in the last 168 hours. BNP (last 3 results) No results for input(s): "PROBNP" in the last 8760 hours. HbA1C: No results for input(s): "HGBA1C" in the last 72 hours. CBG: Recent Labs  Lab 03/12/23 2144 03/13/23 0834  GLUCAP 252* 262*   Lipid Profile: No results for input(s): "CHOL", "HDL", "LDLCALC", "TRIG", "CHOLHDL", "LDLDIRECT" in the last 72 hours. Thyroid Function Tests: No results for input(s): "TSH", "T4TOTAL", "FREET4", "T3FREE", "THYROIDAB" in the last 72 hours. Anemia Panel: No  results for input(s): "VITAMINB12", "FOLATE", "FERRITIN", "TIBC", "IRON", "RETICCTPCT" in the last 72 hours. Sepsis Labs: No results for input(s): "PROCALCITON", "LATICACIDVEN" in the last 168 hours.  Recent Results (from the past 240 hour(s))  SARS Coronavirus 2 by RT PCR (hospital order, performed in [facility] hospital lab) *cepheid single result test* Anterior Nasal Swab     Status: None   Collection Time: 03/12/23  3:43 PM   Specimen: Anterior Nasal Swab  Result Value Ref Range Status   SARS Coronavirus 2 by RT PCR NEGATIVE NEGATIVE Final    Comment: (NOTE) SARS-CoV-2 target nucleic acids are NOT DETECTED.  The SARS-CoV-2 RNA is generally detectable in upper and lower respiratory specimens during the acute phase of infection. The lowest concentration of SARS-CoV-2 viral copies this assay can detect is 250 copies / mL. A negative result does not preclude SARS-CoV-2 infection and should not be used as the sole basis for treatment or other patient management decisions.  A negative result may occur with improper specimen collection / handling, submission of specimen other than nasopharyngeal swab, presence of viral mutation(s) within the areas targeted by this assay, and inadequate number of viral copies (<250 copies / mL). A negative result must be combined with clinical observations, patient history, and epidemiological information.  Fact Sheet for Patients:   RoadLapTop.co.za  Fact Sheet for Healthcare Providers: http://kim-miller.com/  This test is not yet approved or  cleared by the [place] FDA and has been authorized for detection and/or diagnosis of SARS-CoV-2 by FDA under an Emergency Use Authorization (EUA).  This EUA will remain in effect (meaning this test can be used) for the duration of the COVID-19 declaration under Section 564(b)(1) of the Act, 21 U.S.C. section 360bbb-3(b)(1), unless the authorization is  terminated or revoked sooner.  Performed at [facility], [address] [place], [place] [address]          Radiology Studies: DG Chest Portable 1 View  Result Date: 03/12/2023 CLINICAL DATA:  Shortness of breath. EXAM: PORTABLE CHEST 1 VIEW COMPARISON:  September 20, 2022. FINDINGS: The heart size and mediastinal contours are within normal limits. Increased bilateral perihilar and basilar opacities are noted concerning for edema or pneumonia. Small right pleural effusion is noted. The visualized skeletal structures are unremarkable. IMPRESSION: Bilateral lung opacities concerning for edema or pneumonia with small right pleural effusion. Electronically Signed   By: Lupita Raider M.D.   On: 03/12/2023 16:38        Scheduled Meds:  aspirin EC  81 mg Oral Daily   docusate sodium  100 mg Oral BID   enoxaparin (LOVENOX) injection  40 mg Subcutaneous Q24H   furosemide  40 mg Intravenous BID   insulin aspart  0-15 Units Subcutaneous TID WC   insulin aspart  0-5 Units Subcutaneous QHS   isosorbide mononitrate  30 mg Oral Daily   losartan  50 mg Oral Daily   metoprolol succinate  100 mg Oral Daily   rosuvastatin  5 mg Oral Daily   sodium chloride flush  3 mL Intravenous Q12H   spironolactone  25 mg Oral Daily   Continuous Infusions:   LOS: 1 day       Charise Killian, MD [facility] Pager [phone]  If 7PM-7AM, please contact night-coverage [email or web address] 03/13/2023, 8:59 AM

## 2023-03-13 NOTE — Plan of Care (Signed)

## 2023-03-13 NOTE — Progress Notes (Signed)
Mobility Specialist - Progress Note   03/13/23 1459  Mobility  Activity Ambulated independently in hallway;Stood at bedside;Dangled on edge of bed  Level of Assistance Independent  Assistive Device None  Distance Ambulated (ft) 200 ft  Activity Response Tolerated well  Mobility Referral Yes  $Mobility charge 1 Mobility  Mobility Specialist Start Time (ACUTE ONLY) 1251  Mobility Specialist Stop Time (ACUTE ONLY) 1303  Mobility Specialist Time Calculation (min) (ACUTE ONLY) 12 min   Pt sitting EOB upon arrival. Pt STS and ambulates in hallway indep with no LOB noted. Pt returns to EOB with needs in reach.   Terrilyn Saver  Mobility Specialist  03/13/23 3:01 PM

## 2023-03-14 DIAGNOSIS — I5033 Acute on chronic diastolic (congestive) heart failure: Secondary | ICD-10-CM | POA: Diagnosis not present

## 2023-03-14 LAB — GLUCOSE, CAPILLARY
Glucose-Capillary: 168 mg/dL — ABNORMAL HIGH (ref 70–99)
Glucose-Capillary: 201 mg/dL — ABNORMAL HIGH (ref 70–99)
Glucose-Capillary: 96 mg/dL (ref 70–99)
Glucose-Capillary: 261 mg/dL — ABNORMAL HIGH (ref 70–99)

## 2023-03-14 LAB — CBC
HCT: 29.8 % — ABNORMAL LOW (ref 39.0–52.0)
Hemoglobin: 10 g/dL — ABNORMAL LOW (ref 13.0–17.0)
MCH: 33 pg (ref 26.0–34.0)
MCHC: 33.6 g/dL (ref 30.0–36.0)
MCV: 98.3 fL (ref 80.0–100.0)
Platelets: 265 10*3/uL (ref 150–400)
RBC: 3.03 MIL/uL — ABNORMAL LOW (ref 4.22–5.81)
RDW: 13.6 % (ref 11.5–15.5)
WBC: 7.3 10*3/uL (ref 4.0–10.5)
nRBC: 0 % (ref 0.0–0.2)

## 2023-03-14 LAB — BASIC METABOLIC PANEL
Anion gap: 10 (ref 5–15)
BUN: 26 mg/dL — ABNORMAL HIGH (ref 8–23)
CO2: 27 mmol/L (ref 22–32)
Calcium: 8.6 mg/dL — ABNORMAL LOW (ref 8.9–10.3)
Chloride: 102 mmol/L (ref 98–111)
Creatinine, Ser: 1.51 mg/dL — ABNORMAL HIGH (ref 0.61–1.24)
GFR, Estimated: 52 mL/min — ABNORMAL LOW (ref >60)
Glucose, Bld: 134 mg/dL — ABNORMAL HIGH (ref 70–99)
Potassium: 3.5 mmol/L (ref 3.5–5.1)
Sodium: 139 mmol/L (ref 135–145)

## 2023-03-14 NOTE — Progress Notes (Signed)
PROGRESS NOTE    Chad Krupa Sr.  WUJ:[id number] DOB: 1960-10-09 DOA: 03/12/2023 PCP: Pcp, No  Assessment & Plan:   Active Problems:   HTN (hypertension)   Acute on chronic diastolic CHF (congestive heart failure) (HCC)   Diabetes mellitus, type II (HCC)   Dyslipidemia   Tobacco dependence   DNR (do not resuscitate)  Assessment and Plan: Acute on chronic diastolic CHF: continue on lasix, aldactone, metoprolol & losartan. Strict I/Os. Echo is pending. Neg 1.6L. Elevated BNP & CXR shows pulmonary edema. Does not monitor fluid or salt intake at home. Needs CHF education    Acute hypoxic respiratory failure: secondary to above. Continue on supplemental oxygen and wean as tolerated    HTN: poorly controlled secondary to medication noncompliance. Continue on metoprolol, aldactone, losartan, imdur   HLD: continue on statin   DM2: well controlled, HbA1c 6.8. Continue on SSI w/ accuchecks    Tobacco dependence: received cessation counseling already   Overweight: BMI 29.8. Would benefit from weight lost   ACD: likely secondary to DM2. Will transfuse if Hb < 7.0    DVT prophylaxis: lovenox  Code Status: DNR Family Communication: Disposition Plan: likely d/c back home  Level of care: Progressive Status is: Inpatient Remains inpatient appropriate because: severity of illness, requiring IV lasix     Consultants:    Procedures:   Antimicrobials:   Subjective: Pt c/o swelling of legs, improved from day prior   Objective: Vitals:   03/14/23 0500 03/14/23 0617 03/14/23 0740 03/14/23 1142  BP:  137/66 (!) 155/84 138/75  Pulse:  82 80 71  Resp:   18 20  Temp:   98.9 F (37.2 C) 98.5 F (36.9 C)  TempSrc:   Oral Oral  SpO2:   96% 99%  Weight: 82.1 kg     Height:        Intake/Output Summary (Last 24 hours) at 03/14/2023 1447 Last data filed at 03/14/2023 1230 Gross per 24 hour  Intake 580 ml  Output 2270 ml  Net -1690 ml   Filed Weights   03/12/23 1505 03/13/23  0500 03/14/23 0500  Weight: 87 kg 83.8 kg 82.1 kg    Examination:  General exam: Appears comfortable  Respiratory system: decreased breath sounds b/l Cardiovascular system: S1/S2+. No rubs or clicks  Gastrointestinal system: Abd is soft, NT, ND & normal bowel sounds  Central nervous system: alert & oriented. Moves all extremities  Psychiatry: Judgement and insight appears at baseline. Appropriate mood and affect    Data Reviewed: I have personally reviewed following labs and imaging studies  CBC: Recent Labs  Lab 03/12/23 1543 03/13/23 0658 03/14/23 0348  WBC 6.6 6.0 7.3  NEUTROABS 4.6  --   --   HGB 11.3* 9.6* 10.0*  HCT 32.5* 27.7* 29.8*  MCV 94.8 95.2 98.3  PLT 279 260 265   Basic Metabolic Panel: Recent Labs  Lab 03/12/23 1543 03/13/23 0658 03/14/23 0348  NA 139 141 139  K 4.4 3.7 3.5  CL 104 105 102  CO2 25 29 27   GLUCOSE 186* 219* 134*  BUN 20 24* 26*  CREATININE 1.34* 1.55* 1.51*  CALCIUM 8.8* 8.3* 8.6*   GFR: Estimated Creatinine Clearance: 51.7 mL/min (A) (by C-G formula based on SCr of 1.51 mg/dL (H)). Liver Function Tests: Recent Labs  Lab 03/12/23 1543  AST 41  ALT 33  ALKPHOS 388*  BILITOT 1.5*  PROT 7.4  ALBUMIN 3.5   No results for input(s): "LIPASE", "AMYLASE" in the last  168 hours. No results for input(s): "AMMONIA" in the last 168 hours. Coagulation Profile: No results for input(s): "INR", "PROTIME" in the last 168 hours. Cardiac Enzymes: No results for input(s): "CKTOTAL", "CKMB", "CKMBINDEX", "TROPONINI" in the last 168 hours. BNP (last 3 results) No results for input(s): "PROBNP" in the last 8760 hours. HbA1C: Recent Labs    03/13/23 0658  HGBA1C 6.8*   CBG: Recent Labs  Lab 03/13/23 1221 03/13/23 1616 03/13/23 2140 03/14/23 0834 03/14/23 1143  GLUCAP 108* 177* 203* 168* 201*   Lipid Profile: No results for input(s): "CHOL", "HDL", "LDLCALC", "TRIG", "CHOLHDL", "LDLDIRECT" in the last 72 hours. Thyroid Function  Tests: No results for input(s): "TSH", "T4TOTAL", "FREET4", "T3FREE", "THYROIDAB" in the last 72 hours. Anemia Panel: No results for input(s): "VITAMINB12", "FOLATE", "FERRITIN", "TIBC", "IRON", "RETICCTPCT" in the last 72 hours. Sepsis Labs: No results for input(s): "PROCALCITON", "LATICACIDVEN" in the last 168 hours.  Recent Results (from the past 240 hour(s))  SARS Coronavirus 2 by RT PCR (hospital order, performed in [facility] hospital lab) *cepheid single result test* Anterior Nasal Swab     Status: None   Collection Time: 03/12/23  3:43 PM   Specimen: Anterior Nasal Swab  Result Value Ref Range Status   SARS Coronavirus 2 by RT PCR NEGATIVE NEGATIVE Final    Comment: (NOTE) SARS-CoV-2 target nucleic acids are NOT DETECTED.  The SARS-CoV-2 RNA is generally detectable in upper and lower respiratory specimens during the acute phase of infection. The lowest concentration of SARS-CoV-2 viral copies this assay can detect is 250 copies / mL. A negative result does not preclude SARS-CoV-2 infection and should not be used as the sole basis for treatment or other patient management decisions.  A negative result may occur with improper specimen collection / handling, submission of specimen other than nasopharyngeal swab, presence of viral mutation(s) within the areas targeted by this assay, and inadequate number of viral copies (<250 copies / mL). A negative result must be combined with clinical observations, patient history, and epidemiological information.  Fact Sheet for Patients:   RoadLapTop.co.za  Fact Sheet for Healthcare Providers: http://kim-miller.com/  This test is not yet approved or  cleared by the [place] FDA and has been authorized for detection and/or diagnosis of SARS-CoV-2 by FDA under an Emergency Use Authorization (EUA).  This EUA will remain in effect (meaning this test can be used) for the duration of  the COVID-19 declaration under Section 564(b)(1) of the Act, 21 U.S.C. section 360bbb-3(b)(1), unless the authorization is terminated or revoked sooner.  Performed at [facility], [address] [place], [place] [address]          Radiology Studies: DG Chest Portable 1 View  Result Date: 03/12/2023 CLINICAL DATA:  Shortness of breath. EXAM: PORTABLE CHEST 1 VIEW COMPARISON:  September 20, 2022. FINDINGS: The heart size and mediastinal contours are within normal limits. Increased bilateral perihilar and basilar opacities are noted concerning for edema or pneumonia. Small right pleural effusion is noted. The visualized skeletal structures are unremarkable. IMPRESSION: Bilateral lung opacities concerning for edema or pneumonia with small right pleural effusion. Electronically Signed   By: Lupita Raider M.D.   On: 03/12/2023 16:38        Scheduled Meds:  aspirin EC  81 mg Oral Daily   docusate sodium  100 mg Oral BID   enoxaparin (LOVENOX) injection  40 mg Subcutaneous Q24H   furosemide  40 mg Intravenous BID   insulin aspart  0-15 Units  Subcutaneous TID WC   insulin aspart  0-5 Units Subcutaneous QHS   isosorbide mononitrate  30 mg Oral Daily   losartan  50 mg Oral Daily   metoprolol succinate  100 mg Oral Daily   rosuvastatin  5 mg Oral Daily   sodium chloride flush  3 mL Intravenous Q12H   spironolactone  25 mg Oral Daily   Continuous Infusions:   LOS: 2 days       Charise Killian, MD [facility] Pager [phone]  If 7PM-7AM, please contact night-coverage [email or web address] 03/14/2023, 2:47 PM

## 2023-03-14 NOTE — Plan of Care (Signed)
  Problem: Safety: Goal: Ability to remain free from injury will improve Outcome: Progressing   Problem: Skin Integrity: Goal: Risk for impaired skin integrity will decrease Outcome: Progressing   Problem: Elimination: Goal: Will not experience complications related to bowel motility Outcome: Progressing Goal: Will not experience complications related to urinary retention Outcome: Progressing   Problem: Activity: Goal: Risk for activity intolerance will decrease Outcome: Progressing   Problem: Clinical Measurements: Goal: Ability to maintain clinical measurements within normal limits will improve Outcome: Progressing Goal: Will remain free from infection Outcome: Progressing Goal: Diagnostic test results will improve Outcome: Progressing Goal: Respiratory complications will improve Outcome: Progressing Goal: Cardiovascular complication will be avoided Outcome: Progressing

## 2023-03-14 NOTE — Progress Notes (Signed)
Initial Nutrition Assessment  DOCUMENTATION CODES:   Not applicable  INTERVENTION:  - Attach diet handouts to discharge paperwork.   NUTRITION DIAGNOSIS:   Altered nutrition lab value related to chronic illness as evidenced by other (comment) (HgA1c 6.8%).  GOAL:   Patient will meet greater than or equal to 90% of their needs  MONITOR:   PO intake  REASON FOR ASSESSMENT:   Consult Assessment of nutrition requirement/status  ASSESSMENT:   62 y.o. male admits related to SOB. PMH includes: DM, high cholesterol, HTN, stomach ulcer. Pt is currently receiving medical management related to acute on chronic diastolic CHF.  Meds reviewed: colace, lasix, sliding scale insulin, imdur, cozaar, aldactone. Labs reviewed: BUN/Creatinine elevated. HgA1c 6.8%.   RD unable to reach pt via phone. Pt is currently on a HH/CHO mod diet, 1500 mL fluid restriction. RN reports that the pt ate 100% of his breakfast and 90% of his lunch today. No significant wt loss per record. Pt likely meeting his needs at this time. RD will attach diet handouts to discharge paperwork. Will continue to monitor.   NUTRITION - FOCUSED PHYSICAL EXAM:  Remote assessment.   Diet Order:   Diet Order             Diet heart healthy/carb modified Room service appropriate? Yes; Fluid consistency: Thin; Fluid restriction: 1500 mL Fluid  Diet effective now                   EDUCATION NEEDS:   Not appropriate for education at this time  Skin:  Skin Assessment: Reviewed RN Assessment  Last BM:  9/6  Height:   Ht Readings from Last 1 Encounters:  03/12/23 5\' 6"  (1.676 m)    Weight:   Wt Readings from Last 1 Encounters:  03/14/23 82.1 kg    Ideal Body Weight:     BMI:  Body mass index is 29.21 kg/m.  Estimated Nutritional Needs:   Kcal:  2000-2500 kcals  Protein:  100-125 gm  Fluid:  </= 1.5 L  Bethann Humble, RD, LDN, CNSC.

## 2023-03-15 ENCOUNTER — Other Ambulatory Visit (HOSPITAL_COMMUNITY): Payer: Self-pay

## 2023-03-15 ENCOUNTER — Other Ambulatory Visit: Payer: Self-pay

## 2023-03-15 DIAGNOSIS — I5033 Acute on chronic diastolic (congestive) heart failure: Secondary | ICD-10-CM | POA: Diagnosis not present

## 2023-03-15 LAB — CBC
HCT: 28.8 % — ABNORMAL LOW (ref 39.0–52.0)
Hemoglobin: 9.8 g/dL — ABNORMAL LOW (ref 13.0–17.0)
MCH: 32.3 pg (ref 26.0–34.0)
MCHC: 34 g/dL (ref 30.0–36.0)
MCV: 95 fL (ref 80.0–100.0)
Platelets: 264 10*3/uL (ref 150–400)
RBC: 3.03 MIL/uL — ABNORMAL LOW (ref 4.22–5.81)
RDW: 13.2 % (ref 11.5–15.5)
WBC: 6.6 10*3/uL (ref 4.0–10.5)
nRBC: 0 % (ref 0.0–0.2)

## 2023-03-15 LAB — BASIC METABOLIC PANEL
Anion gap: 8 (ref 5–15)
BUN: 29 mg/dL — ABNORMAL HIGH (ref 8–23)
CO2: 30 mmol/L (ref 22–32)
Calcium: 8.6 mg/dL — ABNORMAL LOW (ref 8.9–10.3)
Chloride: 100 mmol/L (ref 98–111)
Creatinine, Ser: 1.54 mg/dL — ABNORMAL HIGH (ref 0.61–1.24)
GFR, Estimated: 51 mL/min — ABNORMAL LOW (ref >60)
Glucose, Bld: 144 mg/dL — ABNORMAL HIGH (ref 70–99)
Potassium: 3.7 mmol/L (ref 3.5–5.1)
Sodium: 138 mmol/L (ref 135–145)

## 2023-03-15 LAB — ECHOCARDIOGRAM COMPLETE
Area-P 1/2: 5.13 cm2
Height: 66 in
S' Lateral: 3.6 cm
Weight: 2955.93 [oz_av]

## 2023-03-15 LAB — GLUCOSE, CAPILLARY
Glucose-Capillary: 159 mg/dL — ABNORMAL HIGH (ref 70–99)
Glucose-Capillary: 244 mg/dL — ABNORMAL HIGH (ref 70–99)

## 2023-03-15 MED ORDER — SPIRONOLACTONE 25 MG PO TABS
25.0000 mg | ORAL_TABLET | Freq: Every day | ORAL | 0 refills | Status: DC
  Filled 2023-03-15: qty 30, 30d supply, fill #0

## 2023-03-15 MED ORDER — INSULIN NPH ISOPHANE & REGULAR (70-30) 100 UNIT/ML ~~LOC~~ SUSP
20.0000 [IU] | Freq: Two times a day (BID) | SUBCUTANEOUS | 0 refills | Status: DC
  Filled 2023-03-15: qty 10, 25d supply, fill #0

## 2023-03-15 MED ORDER — ERGOCALCIFEROL 1.25 MG (50000 UT) PO CAPS
50000.0000 [IU] | ORAL_CAPSULE | ORAL | 0 refills | Status: AC
  Filled 2023-03-15: qty 4, 28d supply, fill #0

## 2023-03-15 MED ORDER — FUROSEMIDE 10 MG/ML IJ SOLN: 40.0000 mg | Freq: Every day | INTRAMUSCULAR | Status: DC

## 2023-03-15 MED ORDER — ISOSORBIDE MONONITRATE ER 30 MG PO TB24
30.0000 mg | ORAL_TABLET | Freq: Every day | ORAL | 0 refills | Status: DC
  Filled 2023-03-15: qty 30, 30d supply, fill #0

## 2023-03-15 MED ORDER — ROSUVASTATIN CALCIUM 5 MG PO TABS
5.0000 mg | ORAL_TABLET | Freq: Every day | ORAL | 0 refills | Status: DC
  Filled 2023-03-15: qty 30, 30d supply, fill #0

## 2023-03-15 MED ORDER — "INSULIN SYRINGE-NEEDLE U-100 31G X 5/16"" 0.5 ML MISC"
0 refills | Status: DC
  Filled 2023-03-15: qty 60, 30d supply, fill #0

## 2023-03-15 MED ORDER — ASPIRIN 81 MG PO TBEC
81.0000 mg | DELAYED_RELEASE_TABLET | Freq: Every day | ORAL | 0 refills | Status: AC
  Filled 2023-03-15: qty 30, 30d supply, fill #0

## 2023-03-15 MED ORDER — FUROSEMIDE 20 MG PO TABS
60.0000 mg | ORAL_TABLET | Freq: Every day | ORAL | 0 refills | Status: DC
  Filled 2023-03-15: qty 90, 30d supply, fill #0

## 2023-03-15 MED ORDER — TAMSULOSIN HCL 0.4 MG PO CAPS
0.4000 mg | ORAL_CAPSULE | Freq: Every day | ORAL | 0 refills | Status: AC
  Filled 2023-03-15: qty 30, 30d supply, fill #0

## 2023-03-15 MED ORDER — DAPAGLIFLOZIN PROPANEDIOL 10 MG PO TABS
10.0000 mg | ORAL_TABLET | Freq: Every day | ORAL | 0 refills | Status: AC
  Filled 2023-03-15: qty 30, 30d supply, fill #0

## 2023-03-15 MED ORDER — METOPROLOL SUCCINATE ER 100 MG PO TB24
100.0000 mg | ORAL_TABLET | Freq: Every day | ORAL | 0 refills | Status: DC
  Filled 2023-03-15: qty 30, 30d supply, fill #0

## 2023-03-15 MED ORDER — ENTRESTO 24-26 MG PO TABS
1.0000 | ORAL_TABLET | Freq: Two times a day (BID) | ORAL | 0 refills | Status: AC
  Filled 2023-03-15: qty 60, 30d supply, fill #0

## 2023-03-15 NOTE — Discharge Summary (Signed)
Physician Discharge Summary  Jarold Cacal Sr. KGM:[id number] DOB: [birth year] DOA: 03/12/2023  PCP: Pcp, No  Admit date: 03/12/2023 Discharge date: 03/15/2023  Admitted From: home  Disposition:  home  Recommendations for Outpatient Follow-up:  Follow up with PCP in 1-2 weeks F/u w/ cardio, [facility] or [facility], in 1-2 weeks   Home Health: no  Equipment/Devices:  Discharge Condition: stable  CODE STATUS: full  Diet recommendation: Heart Healthy / Carb Modified   Brief/Interim Summary: HPI was taken from Dr. Ophelia Charter: Chad Bliss Sr. is a 62 y.o. male with medical history significant of DM, HTN, and HLD presenting with SOB.  He has had fluid on him 3 times this year, March, April, and now.  He felt like his neck was flooded and he couldn't breathe and his chest was hurting.  His legs have been swelling.  He takes his medicines "sometimes", opts not to take them.  He thinks he weighs about 160 at baseline.  Substernal chest discomfort is improved.  He doesn't have a heart doctor.  He doesn't have a PCP either.       ER Course:  SOB, hasn't been taking emds, about 40 pounds up with CHF exacerbation.  Discharge Diagnoses:  Active Problems:   HTN (hypertension)   Acute on chronic diastolic CHF (congestive heart failure) (HCC)   Diabetes mellitus, type II (HCC)   Dyslipidemia   Tobacco dependence   DNR (do not resuscitate)  Acute on chronic diastolic CHF: continue on lasix, aldactone, metoprolol. D/c losartan & start home dose of entresto. Strict I/Os. Echo shows EF 50-55%, grade III diastolic dysfunction, no regional wall motion abnormalities. Neg 1.6L. Elevated BNP & CXR shows pulmonary edema. Does not monitor fluid or salt intake at home. Received CHF education. Pt verbalized his understanding    Acute hypoxic respiratory failure: secondary to above. Weaned off of supplemental oxygen  HTN: poorly controlled secondary to medication noncompliance. Continue on metoprolol, aldactone,  entresto, imdur    HLD: continue on statin    DM2: well controlled, HbA1c 6.8. Continue on SSI w/ accuchecks while inpatient    Tobacco dependence: received cessation counseling already    Overweight: BMI 29.8. Would benefit from weight lost    ACD: likely secondary to DM2. Will transfuse if Hb < 7.0    Discharge Instructions  Discharge Instructions     Diet - low sodium heart healthy   Complete by: As directed    Diet Carb Modified   Complete by: As directed    Discharge instructions   Complete by: As directed    F/u w/ cardio either at [facility] or [facility], in 1-2 weeks. F/u w/ PCP in 1-2 weeks   Increase activity slowly   Complete by: As directed       Allergies as of 03/15/2023   No Known Allergies      Medication List     STOP taking these medications    glipiZIDE 5 MG 24 hr tablet Commonly known as: GLUCOTROL XL   hydrochlorothiazide 12.5 MG tablet Commonly known as: HYDRODIURIL   losartan 25 MG tablet Commonly known as: COZAAR       TAKE these medications    aspirin EC 81 MG tablet Take 1 tablet (81 mg total) by mouth daily.   dapagliflozin propanediol 10 MG Tabs tablet Commonly known as: FARXIGA Take 1 tablet by mouth daily.   Entresto 24-26 MG Generic drug: sacubitril-valsartan Take 1 tablet by mouth 2 (two) times daily.  ergocalciferol 1.25 MG (50000 UT) capsule Commonly known as: VITAMIN D2 Take 50,000 Units by mouth once a week.   furosemide 20 MG tablet Commonly known as: LASIX Take 3 tablets (60 mg total) by mouth daily.   HumuLIN 70/30 (70-30) 100 UNIT/ML injection Generic drug: insulin NPH-regular Human Inject 12 Units into the skin 2 (two) times daily with a meal. What changed: how much to take   hydrALAZINE 50 MG tablet Commonly known as: APRESOLINE Take 1 tablet (50 mg total) by mouth 3 (three) times daily as needed (systolic BP >150).   isosorbide mononitrate 30 MG 24 hr tablet Commonly known as: IMDUR Take 1  tablet (30 mg total) by mouth daily.   metoprolol succinate 100 MG 24 hr tablet Commonly known as: TOPROL-XL Take 1 tablet (100 mg total) by mouth daily. Take with or immediately following a meal.   rosuvastatin 5 MG tablet Commonly known as: CRESTOR Take 1 tablet (5 mg total) by mouth daily.   spironolactone 25 MG tablet Commonly known as: ALDACTONE Take 1 tablet (25 mg total) by mouth daily.   tamsulosin 0.4 MG Caps capsule Commonly known as: FLOMAX Take 0.4 mg by mouth daily after supper.        No Known Allergies  Consultations:    Procedures/Studies: ECHOCARDIOGRAM COMPLETE  Result Date: 03/15/2023    ECHOCARDIOGRAM REPORT   Patient Name:   Chad Schwieterman Sr. Date of Exam: 03/13/2023 Medical Rec #:  [id number]            Height:       66.0 in Accession #:    [id number]           Weight:       184.7 lb Date of Birth:  [birth year]             BSA:          1.933 m Patient Age:    62 years             BP:           129/68 mmHg Patient Gender: M                    HR:           63 bpm. Exam Location:  [facility] Procedure: 2D Echo, Cardiac Doppler and Color Doppler Indications:     I50.21 Acute Systolic CHF  History:         Patient has prior history of Echocardiogram examinations, most                  recent 09/21/2022. Risk Factors:Hypertension, Diabetes and                  Dyslipidemia.  Sonographer:     Daphine Deutscher RDCS Referring Phys:  [id number] JENNIFER YATES Diagnosing Phys: Rozell Searing Custovic IMPRESSIONS  1. Left ventricular ejection fraction, by estimation, is 50 to 55%. Left ventricular ejection fraction by PLAX is 52 %. The left ventricle has low normal function. The left ventricle has no regional wall motion abnormalities. Left ventricular diastolic parameters are consistent with Grade III diastolic dysfunction (restrictive).  2. Right ventricular systolic function is normal. The right ventricular size is mildly enlarged.  3. The mitral valve is grossly normal. No evidence of  mitral valve regurgitation.  4. The aortic valve is grossly normal. Aortic valve regurgitation is trivial. No aortic stenosis is present. FINDINGS  Left Ventricle: Left ventricular ejection fraction, by estimation,  is 50 to 55%. Left ventricular ejection fraction by PLAX is 52 %. The left ventricle has low normal function. The left ventricle has no regional wall motion abnormalities. The left ventricular internal cavity size was normal in size. There is no left ventricular hypertrophy. Left ventricular diastolic parameters are consistent with Grade III diastolic dysfunction (restrictive). Right Ventricle: The right ventricular size is mildly enlarged. No increase in right ventricular wall thickness. Right ventricular systolic function is normal. Left Atrium: Left atrial size was normal in size. Right Atrium: Right atrial size was normal in size. Pericardium: There is no evidence of pericardial effusion. Mitral Valve: The mitral valve is grossly normal. No evidence of mitral valve regurgitation. Tricuspid Valve: The tricuspid valve is grossly normal. Tricuspid valve regurgitation is trivial. Aortic Valve: The aortic valve is grossly normal. Aortic valve regurgitation is trivial. No aortic stenosis is present. Pulmonic Valve: The pulmonic valve was grossly normal. Pulmonic valve regurgitation is not visualized. Aorta: The aortic root and ascending aorta are structurally normal, with no evidence of dilitation. IAS/Shunts: No atrial level shunt detected by color flow Doppler.  LEFT VENTRICLE PLAX 2D LV EF:         Left            Diastology                ventricular     LV e' medial:    6.36 cm/s                ejection        LV E/e' medial:  18.9                fraction by     LV e' lateral:   7.02 cm/s                PLAX is 52      LV E/e' lateral: 17.1                %. LVIDd:         4.90 cm LVIDs:         3.60 cm LV PW:         1.40 cm LV IVS:        0.80 cm LVOT diam:     1.80 cm LV SV:         48 LV SV Index:    25 LVOT Area:     2.54 cm  RIGHT VENTRICLE             IVC RV Basal diam:  4.50 cm     IVC diam: 1.70 cm RV S prime:     12.00 cm/s TAPSE (M-mode): 2.2 cm LEFT ATRIUM             Index        RIGHT ATRIUM           Index LA diam:        4.80 cm 2.48 cm/m   RA Area:     11.40 cm LA Vol (A2C):   47.7 ml 24.67 ml/m  RA Volume:   27.10 ml  14.02 ml/m LA Vol (A4C):   49.5 ml 25.60 ml/m LA Biplane Vol: 50.5 ml 26.12 ml/m  AORTIC VALVE LVOT Vmax:   95.60 cm/s LVOT Vmean:  62.850 cm/s LVOT VTI:    0.190 m  AORTA Ao Root diam: 2.80 cm Ao Asc diam:  3.20 cm MITRAL VALVE MV Area (PHT): 5.13 cm  SHUNTS MV Decel Time: 148 msec     Systemic VTI:  0.19 m MV E velocity: 120.00 cm/s  Systemic Diam: 1.80 cm MV A velocity: 55.70 cm/s MV E/A ratio:  2.15 Sabina Custovic Electronically signed by Clotilde Dieter Signature Date/Time: 03/15/2023/11:11:15 AM    Final    DG Chest Portable 1 View  Result Date: 03/12/2023 CLINICAL DATA:  Shortness of breath. EXAM: PORTABLE CHEST 1 VIEW COMPARISON:  September 20, 2022. FINDINGS: The heart size and mediastinal contours are within normal limits. Increased bilateral perihilar and basilar opacities are noted concerning for edema or pneumonia. Small right pleural effusion is noted. The visualized skeletal structures are unremarkable. IMPRESSION: Bilateral lung opacities concerning for edema or pneumonia with small right pleural effusion. Electronically Signed   By: Lupita Raider M.D.   On: 03/12/2023 16:38   (Echo, Carotid, EGD, Colonoscopy, ERCP)    Subjective: Pt c/o fatigue    Discharge Exam: Vitals:   03/15/23 0800 03/15/23 1212  BP: (!) 180/94 (!) 161/88  Pulse: 75 71  Resp:  18  Temp: 98.5 F (36.9 C) 97.9 F (36.6 C)  SpO2: 99% 95%   Vitals:   03/14/23 2338 03/15/23 0405 03/15/23 0800 03/15/23 1212  BP: (!) 167/87 (!) 180/94 (!) 180/94 (!) 161/88  Pulse: 69 72 75 71  Resp: 20 19  18   Temp: 99.3 F (37.4 C) 99 F (37.2 C) 98.5 F (36.9 C) 97.9 F (36.6 C)   TempSrc:      SpO2: 100% 97% 99% 95%  Weight:      Height:        General: Pt is alert, awake, not in acute distress Cardiovascular: S1/S2 +, no rubs, no gallops Respiratory: decreased breath sounds b/l Abdominal: Soft, NT, ND, bowel sounds + Extremities: +1 pitting edema of b/l LE, no cyanosis    The results of significant diagnostics from this hospitalization (including imaging, microbiology, ancillary and laboratory) are listed below for reference.     Microbiology: Recent Results (from the past 240 hour(s))  SARS Coronavirus 2 by RT PCR (hospital order, performed in [facility] hospital lab) *cepheid single result test* Anterior Nasal Swab     Status: None   Collection Time: 03/12/23  3:43 PM   Specimen: Anterior Nasal Swab  Result Value Ref Range Status   SARS Coronavirus 2 by RT PCR NEGATIVE NEGATIVE Final    Comment: (NOTE) SARS-CoV-2 target nucleic acids are NOT DETECTED.  The SARS-CoV-2 RNA is generally detectable in upper and lower respiratory specimens during the acute phase of infection. The lowest concentration of SARS-CoV-2 viral copies this assay can detect is 250 copies / mL. A negative result does not preclude SARS-CoV-2 infection and should not be used as the sole basis for treatment or other patient management decisions.  A negative result may occur with improper specimen collection / handling, submission of specimen other than nasopharyngeal swab, presence of viral mutation(s) within the areas targeted by this assay, and inadequate number of viral copies (<250 copies / mL). A negative result must be combined with clinical observations, patient history, and epidemiological information.  Fact Sheet for Patients:   RoadLapTop.co.za  Fact Sheet for Healthcare Providers: http://kim-miller.com/  This test is not yet approved or  cleared by the [place] FDA and has been authorized for detection and/or  diagnosis of SARS-CoV-2 by FDA under an Emergency Use Authorization (EUA).  This EUA will remain in effect (meaning this test can be used) for the duration of the  COVID-19 declaration under Section 564(b)(1) of the Act, 21 U.S.C. section 360bbb-3(b)(1), unless the authorization is terminated or revoked sooner.  Performed at [facility], [address] [place], [place] [address]      Labs: BNP (last 3 results) Recent Labs    09/20/22 0110 03/12/23 1543  BNP 1,778.7* 1,006.6*   Basic Metabolic Panel: Recent Labs  Lab 03/12/23 1543 03/13/23 0658 03/14/23 0348 03/15/23 0231  NA 139 141 139 138  K 4.4 3.7 3.5 3.7  CL 104 105 102 100  CO2 25 29 27 30   GLUCOSE 186* 219* 134* 144*  BUN 20 24* 26* 29*  CREATININE 1.34* 1.55* 1.51* 1.54*  CALCIUM 8.8* 8.3* 8.6* 8.6*   Liver Function Tests: Recent Labs  Lab 03/12/23 1543  AST 41  ALT 33  ALKPHOS 388*  BILITOT 1.5*  PROT 7.4  ALBUMIN 3.5   No results for input(s): "LIPASE", "AMYLASE" in the last 168 hours. No results for input(s): "AMMONIA" in the last 168 hours. CBC: Recent Labs  Lab 03/12/23 1543 03/13/23 0658 03/14/23 0348 03/15/23 0231  WBC 6.6 6.0 7.3 6.6  NEUTROABS 4.6  --   --   --   HGB 11.3* 9.6* 10.0* 9.8*  HCT 32.5* 27.7* 29.8* 28.8*  MCV 94.8 95.2 98.3 95.0  PLT 279 260 265 264   Cardiac Enzymes: No results for input(s): "CKTOTAL", "CKMB", "CKMBINDEX", "TROPONINI" in the last 168 hours. BNP: Invalid input(s): "POCBNP" CBG: Recent Labs  Lab 03/14/23 1143 03/14/23 1532 03/14/23 2042 03/15/23 0801 03/15/23 1214  GLUCAP 201* 96 261* 159* 244*   D-Dimer No results for input(s): "DDIMER" in the last 72 hours. Hgb A1c Recent Labs    03/13/23 0658  HGBA1C 6.8*   Lipid Profile No results for input(s): "CHOL", "HDL", "LDLCALC", "TRIG", "CHOLHDL", "LDLDIRECT" in the last 72 hours. Thyroid function studies No results for input(s): "TSH", "T4TOTAL", "T3FREE", "THYROIDAB" in the  last 72 hours.  Invalid input(s): "FREET3" Anemia work up No results for input(s): "VITAMINB12", "FOLATE", "FERRITIN", "TIBC", "IRON", "RETICCTPCT" in the last 72 hours. Urinalysis    Component Value Date/Time   COLORURINE AMBER (A) 09/20/2022 0216   APPEARANCEUR CLEAR (A) 09/20/2022 0216   APPEARANCEUR Clear 05/08/2014 0846   LABSPEC 1.018 09/20/2022 0216   LABSPEC 1.035 05/08/2014 0846   PHURINE 7.0 09/20/2022 0216   GLUCOSEU NEGATIVE 09/20/2022 0216   GLUCOSEU >=500 05/08/2014 0846   HGBUR NEGATIVE 09/20/2022 0216   BILIRUBINUR NEGATIVE 09/20/2022 0216   BILIRUBINUR Negative 05/08/2014 0846   KETONESUR NEGATIVE 09/20/2022 0216   PROTEINUR >=300 (A) 09/20/2022 0216   NITRITE NEGATIVE 09/20/2022 0216   LEUKOCYTESUR TRACE (A) 09/20/2022 0216   LEUKOCYTESUR Negative 05/08/2014 0846   Sepsis Labs Recent Labs  Lab 03/12/23 1543 03/13/23 0658 03/14/23 0348 03/15/23 0231  WBC 6.6 6.0 7.3 6.6   Microbiology Recent Results (from the past 240 hour(s))  SARS Coronavirus 2 by RT PCR (hospital order, performed in [facility] hospital lab) *cepheid single result test* Anterior Nasal Swab     Status: None   Collection Time: 03/12/23  3:43 PM   Specimen: Anterior Nasal Swab  Result Value Ref Range Status   SARS Coronavirus 2 by RT PCR NEGATIVE NEGATIVE Final    Comment: (NOTE) SARS-CoV-2 target nucleic acids are NOT DETECTED.  The SARS-CoV-2 RNA is generally detectable in upper and lower respiratory specimens during the acute phase of infection. The lowest concentration of SARS-CoV-2 viral copies this assay can detect is 250 copies / mL. A  negative result does not preclude SARS-CoV-2 infection and should not be used as the sole basis for treatment or other patient management decisions.  A negative result may occur with improper specimen collection / handling, submission of specimen other than nasopharyngeal swab, presence of viral mutation(s) within the areas targeted by this  assay, and inadequate number of viral copies (<250 copies / mL). A negative result must be combined with clinical observations, patient history, and epidemiological information.  Fact Sheet for Patients:   RoadLapTop.co.za  Fact Sheet for Healthcare Providers: http://kim-miller.com/  This test is not yet approved or  cleared by the [place] FDA and has been authorized for detection and/or diagnosis of SARS-CoV-2 by FDA under an Emergency Use Authorization (EUA).  This EUA will remain in effect (meaning this test can be used) for the duration of the COVID-19 declaration under Section 564(b)(1) of the Act, 21 U.S.C. section 360bbb-3(b)(1), unless the authorization is terminated or revoked sooner.  Performed at [facility], [address], [place], [place] [address]      Time coordinating discharge: Over 30 minutes  SIGNED:   Charise Killian, MD  [facility] 03/15/2023, 12:22 PM Pager   If 7PM-7AM, please contact night-coverage [email or web address]

## 2023-03-15 NOTE — Progress Notes (Signed)
[facility] HF Stewardship  PCP: Pcp, No  PCP-Cardiologist: None  HPI: Chad Athey Sr. is a 62 y.o. male with DMII, poorly controled HTN, and HLD who presented with shortness of breath and lower extremity edema. Reports poor medication adherence. Diuresed with IV Lasix with improvement in symptoms. CAC CT showed nonobstructed coronaries with a CAC score of 9 (63rd percentile). TTE 03/13/23 showed LVEF of 50-55% with GIIIDD. Preparing for discharge today.   Pertinent Lab Values: Creatinine  Date Value Ref Range Status  05/08/2014 0.98 0.60 - 1.30 mg/dL Final   Creatinine, Ser  Date Value Ref Range Status  03/15/2023 1.54 (H) 0.61 - 1.24 mg/dL Final   BUN  Date Value Ref Range Status  03/15/2023 29 (H) 8 - 23 mg/dL Final  16/04/9603 10 7 - 18 mg/dL Final   Potassium  Date Value Ref Range Status  03/15/2023 3.7 3.5 - 5.1 mmol/L Final  05/08/2014 3.5 3.5 - 5.1 mmol/L Final   Sodium  Date Value Ref Range Status  03/15/2023 138 135 - 145 mmol/L Final  05/08/2014 138 136 - 145 mmol/L Final   B Natriuretic Peptide  Date Value Ref Range Status  03/12/2023 1,006.6 (H) 0.0 - 100.0 pg/mL Final    Comment:    Performed at [facility], [address] [place], [place] [address]   Magnesium  Date Value Ref Range Status  09/23/2022 1.9 1.7 - 2.4 mg/dL Final    Comment:    Performed at [facility], [address] [place], [place] [address]   Hgb A1c MFr Bld  Date Value Ref Range Status  03/13/2023 6.8 (H) 4.8 - 5.6 % Final    Comment:    (NOTE) Pre diabetes:          5.7%-6.4%  Diabetes:              >6.4%  Glycemic control for   <7.0% adults with diabetes     Vital Signs: Temp:  [98 F (36.7 C)-99.3 F (37.4 C)] 98.5 F (36.9 C) (09/09 0800) Pulse Rate:  [69-77] 75 (09/09 0800) Resp:  [18-20] 19 (09/09 0405) BP: (138-180)/(75-94) 180/94 (09/09 0800) SpO2:  [95 %-100 %] 99 % (09/09 0800)   Intake/Output Summary (Last 24 hours) at 03/15/2023  1141 Last data filed at 03/15/2023 1100 Gross per 24 hour  Intake 1797 ml  Output 2700 ml  Net -903 ml    Current Inpatient Medications:  -Furosemide 40 mg IV daily -Metoprolol succinate 100 mg daily -Losartan 50 mg daily -Spironolactone 25 mg daily  Prior to admission HF Medications:  -Prescribed multiple, however not taking. Past medications included Entresto, Farxiga, Spironolactone, metoprolol, furosemide and hydralazine. Other vasoactive medications previously prescribed include hydralazine.  Assessment: 1. Diastolic heart failure (LVEF 50-55%), due to NICM. NYHA class II symptoms.  -Patient reports improved symptoms with diuresis. Denies dyspnea.  -BP remains markedly elevated with SBPs of 180s. Can consider resuming previously prescribed Entresto for greater BP control. Has tolerated in the past and will be followed closely outpatient.   Plan: 1) Medication changes recommended at this time: -Consider transition to furosemide 60 mg daily at discharge (prior to admission dose) -Consider transitioning from losartan to previously prescribed Entresto 24-26 mg BID.  2) Patient assistance: -Copays for Clifton Custard, and Jardiance are $0  3) Education: - Patient has been educated on current HF medications and potential additions to HF medication regimen - Patient verbalizes understanding that over the next few months, these medication doses may  change and more medications may be added to optimize HF regimen - Patient has been educated on basic disease state pathophysiology and goals of therapy  Medication Assistance / Insurance Benefits Check:  Does the patient have prescription insurance? Prescription Insurance: Commercial ([employer])  Type of insurance plan:   Does the patient qualify for medication assistance through manufacturers or grants? No  Outpatient Pharmacy:  Prior to admission outpatient pharmacy: [facility] Is the patient agreeable to switch to [facility]  Outpatient Pharmacy?: Yes Is the patient willing to utilize a [facility] pharmacy at discharge?: Yes Thank you for involving pharmacy in this patient's care.  Enos Fling, PharmD, BCPS Phone - [phone] Clinical Pharmacist 03/15/2023 11:41 AM

## 2023-03-15 NOTE — TOC Benefit Eligibility Note (Signed)
Patient Product/process development scientist completed.    The patient is insured through [facility]. Patient has [employer], may use a copay card, and/or apply for patient assistance if available.    Ran test claim for Entresto 24-26 mg and the current 30 day co-pay is $0.00.  Ran test claim for Farxiga 10 mg and the current 30 day co-pay is $0.00.  Ran test claim for Jardiance 10 mg and the current 30 day co-pay is $0.00.   This test claim was processed through [facility]- copay amounts may vary at other pharmacies due to pharmacy/plan contracts, or as the patient moves through the different stages of their insurance plan.     Roland Earl, CPHT Pharmacy Technician III Certified Patient Advocate [facility] Patient Advocate Team Direct Number: [phone]  Fax: [phone]

## 2023-03-15 NOTE — Plan of Care (Signed)
Nutrition Education Note  RD consulted for nutrition education regarding CHF and diabetes.  Lab Results  Component Value Date   HGBA1C 6.8 (H) 03/13/2023   PTA DM medications are 12 units insulin NPH-regular BID.   Labs reviewed: CBGS: 159-244 (inpatient orders for glycemic control are 0-5 units insulin aspart daily at bedtime and 0-15 units insulin aspart TID).    Spoke with pt at bedside. Pt reports feeling better and expects to go home today. 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 Pt estimates that he consumes about one meal per day, but also snacks on peanut butter crackers throughout the day due to history of DM. Pt shares that he usually grills or bakes his meats, uses canned vegetables, and includes sides such as mac and cheese, potatoes, greens, or corn. He seasons his food with [employer] seasoning salt. He also reports frustration about receiving conflicting information on self-management.   RD spent most of the visit discussing self-management. Stressed importance of weighing self daily and when to call MD for weight changes (pt only weighs himself every 2-3 days), taking medications (pt did not take medications PTA due to financial stressors, but reports medications are much more affordable to him now that he has disability benefits), and role of fluids. Pt reports he feels comfortable knowing what he should do and desires not to come back to the hospital.   Case discussed with RN and MD to inform that education has been completed.   RD provided "Heart Healthy, Consistent Carbohydrate Nutrition Therapy" handout from the Academy of Nutrition and Dietetics. Reviewed patient's dietary recall. Provided examples on ways to decrease sodium intake in diet. Discouraged intake of processed foods and use of salt shaker. Encouraged fresh fruits and vegetables as well as whole grain sources of carbohydrates to maximize fiber intake.   RD  discussed why it is important for patient to adhere to diet recommendations, and emphasized the role of fluids, foods to avoid, and importance of weighing self daily.   Discussed different food groups and their effects on blood sugar, emphasizing carbohydrate-containing foods. Provided list of carbohydrates and recommended serving sizes of common foods.  Discussed importance of controlled and consistent carbohydrate intake throughout the day. Provided examples of ways to balance meals/snacks and encouraged intake of high-fiber, whole grain complex carbohydrates. Teach back method used.  Expect fair compliance.  Current diet order is heart healthy/ carb modified with 1.5 L fluid restriction, patient is consuming approximately 90-100% of meals at this time. Labs and medications reviewed. No further nutrition interventions warranted at this time. RD contact information provided. If additional nutrition issues arise, please re-consult RD.   Levada Schilling, RD, LDN, CDCES Registered Dietitian II Certified Diabetes Care and Education Specialist Please refer to [facility] for RD and/or RD on-call/weekend/after hours pager

## 2023-03-15 NOTE — Group Note (Deleted)

## 2023-03-16 ENCOUNTER — Other Ambulatory Visit: Payer: Self-pay

## 2023-03-18 ENCOUNTER — Telehealth: Payer: Self-pay | Admitting: Family

## 2023-03-18 ENCOUNTER — Encounter: Payer: BLUE CROSS/BLUE SHIELD | Admitting: Family

## 2023-03-18 NOTE — Progress Notes (Deleted)
Advanced Heart Failure Clinic Note   Referring Physician: PCP: Pcp, No Cardiologist: None   HPI:  Chad Good is a 62 y/o male with a history of  Admitted 09/20/22 due to swollen genitals and a sore great toe that he noticed after a trip to [place] and back. Chest x-ray showed bilateral pleural effusions right greater than left. Scrotal ultrasound showed scrotal wall thickening without testicular abnormality X-ray left great toe showed a nondisplaced fracture of the distal phalanx of the left great toe. Patient treated with Lasix 80 mg IV and Nitropaste. Placed on lasix gtt. TTE LVEF 55 to 60% no any other significant findings. Had an abnormal nuclear stress test in 2022 and a coronary calcium score of 9, Elevated troponin of 39 likely secondary to demand ischemia. Admitted 10/16/22 due to progressive dyspnea, acute hypoxia, and elevated SBPs 2/2 flash pulm edema / HTN emergency. Required BIPAP on admission but then weaned to room air. Eval'd for amyloid - cMRI without infiltrative disease and EF 55%, PYP neg. proBNP 3944. High sensitive troponins 22 -> 23 -> 29. ECG without ischemic changes, nonspecific inferolateral T wave changes. Chest x-ray with extensive bilateral pulmonary opacities R>L. With possible underlying edema. Small bilateral pleural effusions. Received IVF with improvement in orthostatic hypotension. Admitted 03/12/23 due to feeling like his neck was flooded and he couldn't breathe and his chest was hurting. His legs have been swelling. He takes his medicines "sometimes", opts not to take them.  Elevated BNP & CXR shows pulmonary edema. IV lasix provided and medications adjusted.   Echo 07/17/20: EF 50-55% with mild LVH, Grade I DD Echo 09/21/22: EF 55-60% with mild Chad Echo 10/18/22: EF 40% with mild LVH, estimated RVSP 47 mmHg Echo 03/13/23: EF 50-55% with Grade III DD  cMRI 10/23/22: 1.  The left ventricle is normal in size. There is severe concentric hypertrophy (maximum wall thickness of   2.0 cm). Systolic function is low normal.  The calculated LVEF is 55%.There are no regional wall motion  abnormalities.  2. The right ventricle is normal in size and systolic function.   3.  The atria are normal in size.  4. The aortic valve is trileaflet in morphology. There is no significant aortic valve stenosis or  regurgitation. There is no significant valvular disease.  5. Delayed enhancement imaging is abnormal.       A. There is diffuse patchy hyperenhancement of the basal septal and lateral walls.  This pattern of  scarring is non-ischemic in nature and is seen more in patients with history of uncontrolled hypertension.  However, can not excluded non obstructive hypertrophic cardiomyopathy. The CMR findings probability for  cardiac amyloidosis is low.       B. There is a focal area of epicardial hyperenhancement in the mid-cavity inferolateral wall, possible  embolic in nature.  6.  There is no evidence of an intracardiac thrombus.  7. The pericardium is normal in thickness.  There is trace pericardial effusion.  8. There is a small pleural effusion on the left.   He presents today for his initial visit with a chief complaint of    Review of Systems: [y] = yes, [ ]  = no   General: Weight gain [ ] ; Weight loss [ ] ; Anorexia [ ] ; Fatigue [ ] ; Fever [ ] ; Chills [ ] ; Weakness [ ]   Cardiac: Chest pain/pressure [ ] ; Resting SOB [ ] ; Exertional SOB [ ] ; Orthopnea [ ] ; Pedal Edema [ ] ; Palpitations [ ] ; Syncope [ ] ;  Presyncope [ ] ; Paroxysmal nocturnal dyspnea[ ]   Pulmonary: Cough [ ] ; Wheezing[ ] ; Hemoptysis[ ] ; Sputum [ ] ; Snoring [ ]   GI: Vomiting[ ] ; Dysphagia[ ] ; Melena[ ] ; Hematochezia [ ] ; Heartburn[ ] ; Abdominal pain [ ] ; Constipation [ ] ; Diarrhea [ ] ; BRBPR [ ]   GU: Hematuria[ ] ; Dysuria [ ] ; Nocturia[ ]   Vascular: Pain in legs with walking [ ] ; Pain in feet with lying flat [ ] ; Non-healing sores [ ] ; Stroke [ ] ; TIA [ ] ; Slurred speech [ ] ;  Neuro: Headaches[ ] ; Vertigo[ ] ;  Seizures[ ] ; Paresthesias[ ] ;Blurred vision [ ] ; Diplopia [ ] ; Vision changes [ ]   Ortho/Skin: Arthritis [ ] ; Joint pain [ ] ; Muscle pain [ ] ; Joint swelling [ ] ; Back Pain [ ] ; Rash [ ]   Psych: Depression[ ] ; Anxiety[ ]   Heme: Bleeding problems [ ] ; Clotting disorders [ ] ; Anemia [ ]   Endocrine: Diabetes [ ] ; Thyroid dysfunction[ ]    Past Medical History:  Diagnosis Date   Diabetes mellitus without complication (HCC)    High cholesterol    Hypertension    Stomach ulcer     Current Outpatient Medications  Medication Sig Dispense Refill   aspirin EC 81 MG tablet Take 1 tablet (81 mg total) by mouth daily. 30 tablet 0   dapagliflozin propanediol (FARXIGA) 10 MG TABS tablet Take 1 tablet (10 mg total) by mouth daily. 30 tablet 0   ENTRESTO 24-26 MG Take 1 tablet by mouth 2 (two) times daily. 60 tablet 0   ergocalciferol (VITAMIN D2) 1.25 MG (50000 UT) capsule Take 1 capsule (50,000 Units total) by mouth once a week. 4 capsule 0   furosemide (LASIX) 20 MG tablet Take 3 tablets (60 mg total) by mouth daily. 90 tablet 0   hydrALAZINE (APRESOLINE) 50 MG tablet Take 1 tablet (50 mg total) by mouth 3 (three) times daily as needed (systolic BP >150). 90 tablet 0   insulin NPH-regular Human (70-30) 100 UNIT/ML injection Inject 20 Units into the skin 2 (two) times daily with a meal. 10 mL 0   Insulin Syringe-Needle U-100 (ULTICARE INSULIN SYRINGE) 31G X 5/16" 0.5 ML MISC use twice a day with humulin 70/30 60 each 0   isosorbide mononitrate (IMDUR) 30 MG 24 hr tablet Take 1 tablet (30 mg total) by mouth daily. 30 tablet 0   metoprolol succinate (TOPROL-XL) 100 MG 24 hr tablet Take 1 tablet (100 mg total) by mouth daily. Take with or immediately following a meal. 30 tablet 0   rosuvastatin (CRESTOR) 5 MG tablet Take 1 tablet (5 mg total) by mouth daily. 30 tablet 0   spironolactone (ALDACTONE) 25 MG tablet Take 1 tablet (25 mg total) by mouth daily. 30 tablet 0   tamsulosin (FLOMAX) 0.4 MG CAPS  capsule Take 1 capsule (0.4 mg total) by mouth daily after supper. 30 capsule 0   No current facility-administered medications for this visit.    No Known Allergies    Social History   Socioeconomic History   Marital status: Single    Spouse name: Not on file   Number of children: Not on file   Years of education: Not on file   Highest education level: Not on file  Occupational History   Occupation: group home  Tobacco Use   Smoking status: Every Day    Types: Cigars, Cigarettes    Start date: 07/07/1995   Smokeless tobacco: Never   Tobacco comments:    2-3 cigars/day  Substance and Sexual Activity  Alcohol use: No   Drug use: No   Sexual activity: Yes  Other Topics Concern   Not on file  Social History Narrative   Not on file   Social Determinants of Health   Financial Resource Strain: Medium Risk (10/19/2022)   Received from [facility], [facility]   Overall Financial Resource Strain (CARDIA)    Difficulty of Paying Living Expenses: Somewhat hard  Food Insecurity: No Food Insecurity (03/13/2023)   Hunger Vital Sign    Worried About Running Out of Food in the Last Year: Never true    Ran Out of Food in the Last Year: Never true  Transportation Needs: No Transportation Needs (03/13/2023)   PRAPARE - Administrator, Civil Service (Medical): No    Lack of Transportation (Non-Medical): No  Physical Activity: Inactive (12/09/2022)   Received from [facility], [facility]   Exercise Vital Sign    Days of Exercise per Week: 0 days    Minutes of Exercise per Session: 0 min  Stress: Stress Concern Present (12/09/2022)   Received from [facility], [facility]   [employer] of Occupational Health - Occupational Stress Questionnaire    Feeling of Stress : To some extent  Social Connections: Socially Isolated (12/09/2022)   Received from [facility], [facility]   Social Connection and Isolation Panel [NHANES]    Frequency of Communication with Friends and Family: More than three times a week    Frequency of Social Gatherings with Friends and Family: More than three times a week    Attends Religious Services: Never    Database administrator or Organizations: No    Attends Banker Meetings: Never    Marital Status: Divorced  Catering manager Violence: Not At Risk (03/13/2023)   Humiliation, Afraid, Rape, and Kick questionnaire    Fear of Current or Ex-Partner: No    Emotionally Abused: No    Physically Abused: No    Sexually Abused: No      Family History  Problem Relation Age of Onset   Diabetes Father       PHYSICAL EXAM: General:  Well appearing. No respiratory difficulty HEENT: normal Neck: supple. no JVD. Carotids 2+ bilat; no bruits. No lymphadenopathy or thyromegaly appreciated. Cor: PMI nondisplaced. Regular rate & rhythm. No rubs, gallops or murmurs. Lungs: clear Abdomen: soft, nontender, nondistended. No hepatosplenomegaly. No bruits or masses. Good bowel sounds. Extremities: no cyanosis, clubbing, rash, edema Neuro: alert & oriented x 3, cranial nerves grossly intact. moves all 4 extremities w/o difficulty. Affect pleasant.  ECG:   ASSESSMENT & PLAN:  1: Chronic heart failure with preserved ejection fraction- - suspect - NYHA class - euvolemic - weighing daily - Echo 07/17/20: EF 50-55% with mild LVH, Grade I DD - Echo 09/21/22: EF 55-60% with mild Chad - Echo 10/18/22: EF 40% with mild LVH, estimated RVSP 47 mmHg - Echo 03/13/23: EF 50-55% with Grade III DD - cMRI 10/23/22:   1.  The left ventricle is normal in size. There is severe concentric hypertrophy (maximum wall thickness of   2.0 cm). Systolic function is low normal.  The calculated LVEF is 55%.There are no regional wall motion abnormalities.    2. The right ventricle is normal in size and systolic function.      3.  The atria are normal in size.    4. The aortic  valve is trileaflet in morphology. There is no significant aortic valve stenosis or regurgitation. There is no significant valvular disease.    5. Delayed enhancement imaging is abnormal.       A. There is diffuse patchy hyperenhancement of the basal septal and lateral walls.  This pattern of scarring is non-ischemic in nature and is seen more in patients with history of uncontrolled hypertension. However, can not exclude non obstructive    hypertrophic cardiomyopathy. The CMR findings probability for cardiac amyloidosis is low.       B. There is a focal area of epicardial hyperenhancement in the mid-cavity inferolateral wall, possible embolic in nature.    6.  There is no evidence of an intracardiac thrombus.    7. The pericardium is normal in thickness.  There is trace pericardial effusion.    8. There is a small pleural effusion on the left.  - continue  - BNP 03/12/23 was 1006.6  2: HTN- - BP  - BMP 03/15/23 showed sodium 138, potassium 3.7, creatinine 1.54 & GFR [address]   Delma Freeze, [place] 03/18/23

## 2023-03-18 NOTE — Telephone Encounter (Signed)
Patient did not show for his initial Heart Failure Clinic appointment on 03/18/23.

## 2023-08-16 ENCOUNTER — Encounter: Payer: Self-pay | Admitting: Emergency Medicine

## 2023-08-16 ENCOUNTER — Inpatient Hospital Stay
Admission: EM | Admit: 2023-08-16 | Discharge: 2023-08-20 | DRG: 291 | Disposition: A | Discharge status: Home / Self Care | Payer: BLUE CROSS/BLUE SHIELD | Attending: Internal Medicine | Admitting: Internal Medicine

## 2023-08-16 ENCOUNTER — Other Ambulatory Visit: Payer: Self-pay

## 2023-08-16 ENCOUNTER — Emergency Department: Payer: BLUE CROSS/BLUE SHIELD

## 2023-08-16 DIAGNOSIS — E1122 Type 2 diabetes mellitus with diabetic chronic kidney disease: Secondary | ICD-10-CM | POA: Diagnosis present

## 2023-08-16 DIAGNOSIS — Z91148 Patient's other noncompliance with medication regimen for other reason: Secondary | ICD-10-CM | POA: Diagnosis not present

## 2023-08-16 DIAGNOSIS — E876 Hypokalemia: Secondary | ICD-10-CM | POA: Diagnosis present

## 2023-08-16 DIAGNOSIS — E1165 Type 2 diabetes mellitus with hyperglycemia: Secondary | ICD-10-CM | POA: Diagnosis present

## 2023-08-16 DIAGNOSIS — J9601 Acute respiratory failure with hypoxia: Secondary | ICD-10-CM | POA: Diagnosis present

## 2023-08-16 DIAGNOSIS — Z7984 Long term (current) use of oral hypoglycemic drugs: Secondary | ICD-10-CM | POA: Diagnosis not present

## 2023-08-16 DIAGNOSIS — E7800 Pure hypercholesterolemia, unspecified: Secondary | ICD-10-CM | POA: Diagnosis present

## 2023-08-16 DIAGNOSIS — Z7982 Long term (current) use of aspirin: Secondary | ICD-10-CM | POA: Diagnosis not present

## 2023-08-16 DIAGNOSIS — E16A2 Hypoglycemia level 2: Secondary | ICD-10-CM | POA: Diagnosis present

## 2023-08-16 DIAGNOSIS — F17210 Nicotine dependence, cigarettes, uncomplicated: Secondary | ICD-10-CM | POA: Diagnosis present

## 2023-08-16 DIAGNOSIS — E11649 Type 2 diabetes mellitus with hypoglycemia without coma: Secondary | ICD-10-CM | POA: Diagnosis not present

## 2023-08-16 DIAGNOSIS — I509 Heart failure, unspecified: Secondary | ICD-10-CM | POA: Diagnosis present

## 2023-08-16 DIAGNOSIS — N1831 Chronic kidney disease, stage 3a: Secondary | ICD-10-CM | POA: Diagnosis present

## 2023-08-16 DIAGNOSIS — Z794 Long term (current) use of insulin: Secondary | ICD-10-CM | POA: Diagnosis not present

## 2023-08-16 DIAGNOSIS — F17290 Nicotine dependence, other tobacco product, uncomplicated: Secondary | ICD-10-CM | POA: Diagnosis present

## 2023-08-16 DIAGNOSIS — E88819 Insulin resistance, unspecified: Secondary | ICD-10-CM | POA: Diagnosis present

## 2023-08-16 DIAGNOSIS — N179 Acute kidney failure, unspecified: Secondary | ICD-10-CM | POA: Diagnosis present

## 2023-08-16 DIAGNOSIS — Z79899 Other long term (current) drug therapy: Secondary | ICD-10-CM | POA: Diagnosis not present

## 2023-08-16 DIAGNOSIS — I5033 Acute on chronic diastolic (congestive) heart failure: Secondary | ICD-10-CM | POA: Diagnosis present

## 2023-08-16 DIAGNOSIS — I161 Hypertensive emergency: Secondary | ICD-10-CM | POA: Diagnosis present

## 2023-08-16 DIAGNOSIS — I130 Hypertensive heart and chronic kidney disease with heart failure and stage 1 through stage 4 chronic kidney disease, or unspecified chronic kidney disease: Secondary | ICD-10-CM | POA: Diagnosis present

## 2023-08-16 DIAGNOSIS — Z833 Family history of diabetes mellitus: Secondary | ICD-10-CM | POA: Diagnosis not present

## 2023-08-16 LAB — CBC WITH DIFFERENTIAL/PLATELET
Abs Immature Granulocytes: 0.03 10*3/uL (ref 0.00–0.07)
Basophils Absolute: 0.1 10*3/uL (ref 0.0–0.1)
Basophils Relative: 1 %
Eosinophils Absolute: 0.3 10*3/uL (ref 0.0–0.5)
Eosinophils Relative: 4 %
HCT: 31.2 % — ABNORMAL LOW (ref 39.0–52.0)
Hemoglobin: 10.7 g/dL — ABNORMAL LOW (ref 13.0–17.0)
Immature Granulocytes: 0 %
Lymphocytes Relative: 21 %
Lymphs Abs: 1.7 10*3/uL (ref 0.7–4.0)
MCH: 31.6 pg (ref 26.0–34.0)
MCHC: 34.3 g/dL (ref 30.0–36.0)
MCV: 92 fL (ref 80.0–100.0)
Monocytes Absolute: 0.5 10*3/uL (ref 0.1–1.0)
Monocytes Relative: 6 %
Neutro Abs: 5.4 10*3/uL (ref 1.7–7.7)
Neutrophils Relative %: 68 %
Platelets: 246 10*3/uL (ref 150–400)
RBC: 3.39 MIL/uL — ABNORMAL LOW (ref 4.22–5.81)
RDW: 13.3 % (ref 11.5–15.5)
WBC: 8 10*3/uL (ref 4.0–10.5)
nRBC: 0 % (ref 0.0–0.2)

## 2023-08-16 LAB — COMPREHENSIVE METABOLIC PANEL
ALT: 20 U/L (ref 0–44)
AST: 25 U/L (ref 15–41)
Albumin: 3.2 g/dL — ABNORMAL LOW (ref 3.5–5.0)
Alkaline Phosphatase: 254 U/L — ABNORMAL HIGH (ref 38–126)
Anion gap: 7 (ref 5–15)
BUN: 21 mg/dL (ref 8–23)
CO2: 28 mmol/L (ref 22–32)
Calcium: 8.6 mg/dL — ABNORMAL LOW (ref 8.9–10.3)
Chloride: 106 mmol/L (ref 98–111)
Creatinine, Ser: 1.64 mg/dL — ABNORMAL HIGH (ref 0.61–1.24)
GFR, Estimated: 47 mL/min — ABNORMAL LOW (ref >60)
Glucose, Bld: 227 mg/dL — ABNORMAL HIGH (ref 70–99)
Potassium: 4.1 mmol/L (ref 3.5–5.1)
Sodium: 141 mmol/L (ref 135–145)
Total Bilirubin: 1.2 mg/dL (ref 0.0–1.2)
Total Protein: 7.3 g/dL (ref 6.5–8.1)

## 2023-08-16 LAB — TROPONIN I (HIGH SENSITIVITY)
Troponin I (High Sensitivity): 36 ng/L — ABNORMAL HIGH (ref <18)
Troponin I (High Sensitivity): 37 ng/L — ABNORMAL HIGH (ref <18)

## 2023-08-16 LAB — CBG MONITORING, ED
Glucose-Capillary: 187 mg/dL — ABNORMAL HIGH (ref 70–99)
Glucose-Capillary: 101 mg/dL — ABNORMAL HIGH (ref 70–99)
Glucose-Capillary: 106 mg/dL — ABNORMAL HIGH (ref 70–99)

## 2023-08-16 LAB — BRAIN NATRIURETIC PEPTIDE: B Natriuretic Peptide: 1149 pg/mL — ABNORMAL HIGH (ref 0.0–100.0)

## 2023-08-16 MED ORDER — FUROSEMIDE 10 MG/ML IJ SOLN
80.0000 mg | Freq: Once | INTRAMUSCULAR | Status: AC
  Administered 2023-08-16: 80 mg via INTRAVENOUS
  Filled 2023-08-16: qty 8

## 2023-08-16 MED ORDER — INSULIN ASPART 100 UNIT/ML IJ SOLN
30.0000 [IU] | Freq: Three times a day (TID) | INTRAMUSCULAR | Status: DC
  Filled 2023-08-16: qty 1

## 2023-08-16 MED ORDER — SODIUM CHLORIDE 0.9% FLUSH: 3.0000 mL | INTRAVENOUS | Status: DC | PRN

## 2023-08-16 MED ORDER — INSULIN ASPART 100 UNIT/ML IJ SOLN: 0.0000 [IU] | Freq: Every day | INTRAMUSCULAR | Status: DC

## 2023-08-16 MED ORDER — INSULIN ASPART 100 UNIT/ML IJ SOLN
0.0000 [IU] | Freq: Three times a day (TID) | INTRAMUSCULAR | Status: DC
  Administered 2023-08-16: 3 [IU] via SUBCUTANEOUS
  Filled 2023-08-16: qty 1

## 2023-08-16 MED ORDER — SACUBITRIL-VALSARTAN 49-51 MG PO TABS
1.0000 | ORAL_TABLET | Freq: Two times a day (BID) | ORAL | Status: DC
  Administered 2023-08-16 – 2023-08-18 (×5): 1 via ORAL
  Filled 2023-08-16 (×5): qty 1

## 2023-08-16 MED ORDER — INSULIN GLARGINE-YFGN 100 UNIT/ML ~~LOC~~ SOLN
30.0000 [IU] | Freq: Every day | SUBCUTANEOUS | Status: DC
  Administered 2023-08-16 – 2023-08-17 (×2): 30 [IU] via SUBCUTANEOUS
  Filled 2023-08-16 (×3): qty 0.3

## 2023-08-16 MED ORDER — NITROGLYCERIN 2 % TD OINT
1.0000 [in_us] | TOPICAL_OINTMENT | Freq: Once | TRANSDERMAL | Status: AC
  Administered 2023-08-16: 1 [in_us] via TOPICAL
  Filled 2023-08-16: qty 1

## 2023-08-16 MED ORDER — ISOSORBIDE MONONITRATE ER 30 MG PO TB24
30.0000 mg | ORAL_TABLET | Freq: Every day | ORAL | Status: DC
Start: 2023-08-16 — End: 2023-08-20
  Administered 2023-08-16 – 2023-08-20 (×5): 30 mg via ORAL
  Filled 2023-08-16 (×5): qty 1

## 2023-08-16 MED ORDER — SODIUM CHLORIDE 0.9% FLUSH
3.0000 mL | Freq: Two times a day (BID) | INTRAVENOUS | Status: DC
  Administered 2023-08-16 – 2023-08-19 (×8): 3 mL via INTRAVENOUS

## 2023-08-16 MED ORDER — ASPIRIN 81 MG PO TBEC
81.0000 mg | DELAYED_RELEASE_TABLET | Freq: Every day | ORAL | Status: DC
  Administered 2023-08-17 – 2023-08-20 (×4): 81 mg via ORAL
  Filled 2023-08-16 (×4): qty 1

## 2023-08-16 MED ORDER — ACETAMINOPHEN 325 MG PO TABS
650.0000 mg | ORAL_TABLET | ORAL | Status: DC | PRN
  Administered 2023-08-17: 650 mg via ORAL
  Filled 2023-08-16: qty 2

## 2023-08-16 MED ORDER — LINAGLIPTIN 5 MG PO TABS
5.0000 mg | ORAL_TABLET | Freq: Every day | ORAL | Status: DC
  Administered 2023-08-16 – 2023-08-18 (×3): 5 mg via ORAL
  Filled 2023-08-16 (×4): qty 1

## 2023-08-16 MED ORDER — DAPAGLIFLOZIN PROPANEDIOL 10 MG PO TABS
10.0000 mg | ORAL_TABLET | Freq: Every day | ORAL | Status: DC
  Administered 2023-08-16 – 2023-08-20 (×5): 10 mg via ORAL
  Filled 2023-08-16 (×5): qty 1

## 2023-08-16 MED ORDER — LISINOPRIL 10 MG PO TABS: 10.0000 mg | ORAL_TABLET | Freq: Every day | ORAL | Status: DC

## 2023-08-16 MED ORDER — SPIRONOLACTONE 25 MG PO TABS
25.0000 mg | ORAL_TABLET | Freq: Every day | ORAL | Status: DC
  Administered 2023-08-16 – 2023-08-18 (×3): 25 mg via ORAL
  Filled 2023-08-16 (×3): qty 1

## 2023-08-16 MED ORDER — METOPROLOL SUCCINATE ER 100 MG PO TB24
100.0000 mg | ORAL_TABLET | Freq: Every day | ORAL | Status: DC
  Administered 2023-08-16 – 2023-08-20 (×5): 100 mg via ORAL
  Filled 2023-08-16: qty 1
  Filled 2023-08-16: qty 2
  Filled 2023-08-16: qty 1
  Filled 2023-08-16: qty 2
  Filled 2023-08-16: qty 1

## 2023-08-16 MED ORDER — SODIUM CHLORIDE 0.9 % IV SOLN: 250.0000 mL | INTRAVENOUS | Status: AC | PRN

## 2023-08-16 MED ORDER — FUROSEMIDE 10 MG/ML IJ SOLN
60.0000 mg | Freq: Two times a day (BID) | INTRAMUSCULAR | Status: DC
Start: 2023-08-16 — End: 2023-08-18
  Administered 2023-08-16 – 2023-08-18 (×4): 60 mg via INTRAVENOUS
  Filled 2023-08-16 (×2): qty 8
  Filled 2023-08-16 (×2): qty 6

## 2023-08-16 MED ORDER — ENOXAPARIN SODIUM 40 MG/0.4ML IJ SOSY
40.0000 mg | PREFILLED_SYRINGE | INTRAMUSCULAR | Status: DC
  Administered 2023-08-16 – 2023-08-19 (×4): 40 mg via SUBCUTANEOUS
  Filled 2023-08-16 (×4): qty 0.4

## 2023-08-16 MED ORDER — HYDRALAZINE HCL 50 MG PO TABS
50.0000 mg | ORAL_TABLET | Freq: Three times a day (TID) | ORAL | Status: DC | PRN
  Administered 2023-08-16 – 2023-08-19 (×2): 50 mg via ORAL
  Filled 2023-08-16 (×4): qty 1

## 2023-08-16 MED ORDER — ONDANSETRON HCL 4 MG/2ML IJ SOLN
4.0000 mg | Freq: Four times a day (QID) | INTRAMUSCULAR | Status: DC | PRN
Start: 2023-08-16 — End: 2023-08-20

## 2023-08-16 MED ORDER — ROSUVASTATIN CALCIUM 5 MG PO TABS
5.0000 mg | ORAL_TABLET | Freq: Every day | ORAL | Status: DC
  Administered 2023-08-16 – 2023-08-20 (×5): 5 mg via ORAL
  Filled 2023-08-16 (×5): qty 1

## 2023-08-16 MED ORDER — ASPIRIN 81 MG PO CHEW
324.0000 mg | CHEWABLE_TABLET | Freq: Once | ORAL | Status: AC
  Administered 2023-08-16: 324 mg via ORAL
  Filled 2023-08-16: qty 4

## 2023-08-16 MED ORDER — TAMSULOSIN HCL 0.4 MG PO CAPS
0.4000 mg | ORAL_CAPSULE | Freq: Every day | ORAL | Status: DC
  Administered 2023-08-16 – 2023-08-20 (×5): 0.4 mg via ORAL
  Filled 2023-08-16 (×5): qty 1

## 2023-08-16 NOTE — TOC Progression Note (Signed)
 Transition of Care (TOC) - Progression Note    Patient Details  Name: Chad Arellanes Sr. MRN: [id number] Date of Birth: 05/15/1961  Transition of Care [facility]) CM/SW Contact  Tilmon Font, [place] Phone Number: 08/16/2023, 8:56 AM  Clinical Narrative:     CSW noted [facility] consult; however, CSW unable to complete consult until PT/OT complete assessment and make recommendations. CSW asked attending to re-consult TOC once PT/OT assess and make recs. CSW closing consult.         Expected Discharge Plan and Services                                               Social Determinants of Health (SDOH) Interventions SDOH Screenings   Food Insecurity: No Food Insecurity (03/13/2023)  Housing: Low Risk  (03/13/2023)  Transportation Needs: No Transportation Needs (03/13/2023)  Utilities: Not At Risk (03/13/2023)  Depression (PHQ2-9): Low Risk  (09/08/2021)  Financial Resource Strain: Medium Risk (10/19/2022)   Received from [facility], [facility]  Physical Activity: Inactive (12/09/2022)   Received from [facility], [facility]  Social Connections: Socially Isolated (12/09/2022)   Received from [facility], [facility]  Stress: Stress Concern Present (12/09/2022)   Received from [facility], [facility]  Tobacco Use: High Risk (08/16/2023)  Health Literacy: Inadequate Health Literacy (12/09/2022)   Received from [facility], [facility]    Readmission Risk Interventions     No data to display

## 2023-08-16 NOTE — H&P (Signed)
 History and Physical    Chad Koob Sr. ZOX:[id number] DOB: 11-Dec-1960 DOA: 08/16/2023  PCP: Pcp, No (Confirm with patient/family/NH records and if not entered, this has to be entered at [facility] point of entry) Patient coming from: Home  I have personally briefly reviewed patient's old medical records in [facility]  Chief Complaint: SOB, leg swelling  HPI: Chad Freeh Sr. is a 63 y.o. male with medical history significant of refractory HTN, HLD, IDDM with insulin  resistance, chronic HFpEF, CKD stage III, presented with worsening of shortness of breath and leg swelling.  Patient claimed that he ran out of his Lasix  about 1 week ago and gradually started accumulating fluid in his legs and last few days has been having increasing exertional dyspnea and orthopnea and came to ED last night.  Denies any cough no chest pains no fever or chills.  He reported that he has been compliant with other CHF and BP medications.   ED Course: Afebrile, no tachycardia blood pressure 200s/100 O2 sat of 87% on room air and stabilized on 2 L oxygen.  O2 sat improved to 98%.  Chest x-ray showed pulmonary congestion.  Blood work showed creatinine 1.6 compared to baseline 1.3-1.5, troponin 36> 37, globin 10.7, WBC 8.0.  Patient received aspirin  324 mg x 1 and Lasix  80 mg IV x 1 in the ED.  Review of Systems: As per HPI otherwise 14 point review of systems negative.    Past Medical History:  Diagnosis Date   Diabetes mellitus without complication (HCC)    High cholesterol    Hypertension    Stomach ulcer     Past Surgical History:  Procedure Laterality Date   COLONOSCOPY     FEMUR CLOSED REDUCTION       reports that he has been smoking cigars and cigarettes. He started smoking about 28 years ago. He has never used smokeless tobacco. He reports that he does not drink alcohol and does not use drugs.  No Known Allergies  Family History  Problem Relation Age of Onset   Diabetes Father       Prior to Admission medications   Medication Sig Start Date End Date Taking? Authorizing Provider  aspirin  EC 81 MG tablet Take 1 tablet by mouth daily.   Yes [provider]  dapagliflozin  propanediol (FARXIGA ) 10 MG TABS tablet Take 1 tablet by mouth daily. 06/08/23 06/07/24 Yes [provider]  furosemide  (LASIX ) 20 MG tablet Take 3 tablets (60 mg total) by mouth daily. 03/15/23 08/16/23 Yes Alphonsus Jeans, MD  glipiZIDE  (GLUCOTROL ) 10 MG tablet Take 10 mg by mouth daily before breakfast. 04/22/17  Yes [provider]  hydrALAZINE  (APRESOLINE ) 50 MG tablet Take 1 tablet (50 mg total) by mouth 3 (three) times daily as needed (systolic BP >150). 09/23/22 08/16/23 Yes Althia Atlas, MD  insulin  lispro (HUMALOG) 100 UNIT/ML injection Inject 30 Units into the skin 3 (three) times daily with meals.   Yes [provider]  insulin  NPH-regular Human (70-30) 100 UNIT/ML injection Inject 20 Units into the skin 2 (two) times daily with a meal. 03/15/23 08/16/23 Yes Alphonsus Jeans, MD  isosorbide  mononitrate (IMDUR ) 30 MG 24 hr tablet Take 1 tablet (30 mg total) by mouth daily. 03/15/23 08/16/23 Yes Alphonsus Jeans, MD  linagliptin  (TRADJENTA ) 5 MG TABS tablet Take 5 mg by mouth daily. 01/24/16  Yes [provider]  lisinopril  (ZESTRIL ) 10 MG tablet Take 10 mg by mouth daily. 10/28/10  Yes [provider]  metFORMIN  (GLUCOPHAGE -XR) 500 MG 24 hr tablet Take 500 mg by mouth 2 (two) times daily with a meal. 01/24/16  Yes [provider]  metoprolol  succinate (TOPROL -XL) 100 MG 24 hr tablet Take 1 tablet (100 mg total) by mouth daily. Take with or immediately following a meal. 03/15/23 08/16/23 Yes Alphonsus Jeans, MD  rosuvastatin  (CRESTOR ) 5 MG tablet Take 1 tablet (5 mg total) by mouth daily. 03/15/23 08/16/23 Yes Alphonsus Jeans, MD  sacubitril -valsartan  (ENTRESTO ) 49-51 MG Take 1 tablet by mouth 2 (two) times daily. 06/11/23 06/10/24 Yes  [provider]  spironolactone  (ALDACTONE ) 25 MG tablet Take 1 tablet (25 mg total) by mouth daily. 03/15/23 08/16/23 Yes Alphonsus Jeans, MD  tamsulosin  (FLOMAX ) 0.4 MG CAPS capsule Take 1 capsule by mouth daily. 06/08/23 06/07/24 Yes [provider]  Insulin  Syringe-Needle U-100 (ULTICARE INSULIN  SYRINGE) 31G X 5/16" 0.5 ML MISC use twice a day with humulin  70/30 03/15/23   Alphonsus Jeans, MD    Physical Exam: Vitals:   08/16/23 0317 08/16/23 0323 08/16/23 0550 08/16/23 0702  BP: (!) 201/105  (!) 196/107   Pulse: 88  77   Resp: 20  (!) 28   Temp: 97.6 F (36.4 C)   98.2 F (36.8 C)  TempSrc: Oral   Oral  SpO2: (!) 87% 93% 98%   Weight:      Height:        Constitutional: NAD, calm, comfortable Vitals:   08/16/23 0317 08/16/23 0323 08/16/23 0550 08/16/23 0702  BP: (!) 201/105  (!) 196/107   Pulse: 88  77   Resp: 20  (!) 28   Temp: 97.6 F (36.4 C)   98.2 F (36.8 C)  TempSrc: Oral   Oral  SpO2: (!) 87% 93% 98%   Weight:      Height:       Eyes: PERRL, lids and conjunctivae normal ENMT: Mucous membranes are moist. Posterior pharynx clear of any exudate or lesions.Normal dentition.  Neck: normal, supple, no masses, no thyromegaly Respiratory: clear to auscultation bilaterally, no wheezing, fine crackles on bilateral lower fields, increasing respiratory effort. No accessory muscle use.  Cardiovascular: Regular rate and rhythm, no murmurs / rubs / gallops. 2+ extremity edema. 2+ pedal pulses. No carotid bruits.  Abdomen: no tenderness, no masses palpated. No hepatosplenomegaly. Bowel sounds positive.  Musculoskeletal: no clubbing / cyanosis. No joint deformity upper and lower extremities. Good ROM, no contractures. Normal muscle tone.  Skin: no rashes, lesions, ulcers. No induration Neurologic: CN 2-12 grossly intact. Sensation intact, DTR normal. Strength 5/5 in all 4.  Psychiatric: Normal judgment and insight. Alert and oriented x 3. Normal mood.      Labs on Admission: I have personally reviewed following labs and imaging studies  CBC: Recent Labs  Lab 08/16/23 0340  WBC 8.0  NEUTROABS 5.4  HGB 10.7*  HCT 31.2*  MCV 92.0  PLT 246   Basic Metabolic Panel: Recent Labs  Lab 08/16/23 0340  NA 141  K 4.1  CL 106  CO2 28  GLUCOSE 227*  BUN 21  CREATININE 1.64*  CALCIUM  8.6*   GFR: Estimated Creatinine Clearance: 47.1 mL/min (A) (by C-G formula based on SCr of 1.64 mg/dL (H)). Liver Function Tests: Recent Labs  Lab 08/16/23 0340  AST 25  ALT 20  ALKPHOS 254*  BILITOT 1.2  PROT 7.3  ALBUMIN 3.2*   No results for input(s): "LIPASE", "AMYLASE" in the last 168 hours. No results for input(s): "  AMMONIA" in the last 168 hours. Coagulation Profile: No results for input(s): "INR", "PROTIME" in the last 168 hours. Cardiac Enzymes: No results for input(s): "CKTOTAL", "CKMB", "CKMBINDEX", "TROPONINI" in the last 168 hours. BNP (last 3 results) No results for input(s): "PROBNP" in the last 8760 hours. HbA1C: No results for input(s): "HGBA1C" in the last 72 hours. CBG: No results for input(s): "GLUCAP" in the last 168 hours. Lipid Profile: No results for input(s): "CHOL", "HDL", "LDLCALC", "TRIG", "CHOLHDL", "LDLDIRECT" in the last 72 hours. Thyroid Function Tests: No results for input(s): "TSH", "T4TOTAL", "FREET4", "T3FREE", "THYROIDAB" in the last 72 hours. Anemia Panel: No results for input(s): "VITAMINB12", "FOLATE", "FERRITIN", "TIBC", "IRON", "RETICCTPCT" in the last 72 hours. Urine analysis:    Component Value Date/Time   COLORURINE AMBER (A) 09/20/2022 0216   APPEARANCEUR CLEAR (A) 09/20/2022 0216   APPEARANCEUR Clear 05/08/2014 0846   LABSPEC 1.018 09/20/2022 0216   LABSPEC 1.035 05/08/2014 0846   PHURINE 7.0 09/20/2022 0216   GLUCOSEU NEGATIVE 09/20/2022 0216   GLUCOSEU >=500 05/08/2014 0846   HGBUR NEGATIVE 09/20/2022 0216   BILIRUBINUR NEGATIVE 09/20/2022 0216   BILIRUBINUR Negative 05/08/2014  0846   KETONESUR NEGATIVE 09/20/2022 0216   PROTEINUR >=300 (A) 09/20/2022 0216   NITRITE NEGATIVE 09/20/2022 0216   LEUKOCYTESUR TRACE (A) 09/20/2022 0216   LEUKOCYTESUR Negative 05/08/2014 0846    Radiological Exams on Admission: DG Chest Port 1 View Result Date: 08/16/2023 CLINICAL DATA:  63 year old male with history of shortness of breath. EXAM: PORTABLE CHEST 1 VIEW COMPARISON:  Chest x-ray 03/12/2023. FINDINGS: There is cephalization of the pulmonary vasculature, indistinctness of the interstitial markings, and patchy airspace disease throughout the lungs bilaterally suggestive of moderate pulmonary edema. Small bilateral pleural effusions. Mild cardiomegaly. Upper mediastinal contours are within normal limits. IMPRESSION: 1. The appearance of the chest is most compatible with moderate congestive heart failure, as above. Electronically Signed   By: Alexandria Angel M.D.   On: 08/16/2023 05:55    EKG: Independently reviewed.  Sinus rhythm, no acute ST changes.  Assessment/Plan Principal Problem:   Acute on chronic diastolic CHF (congestive heart failure) (HCC) Active Problems:   CHF (congestive heart failure) (HCC)  (please populate well all problems here in Problem List. (For example, if patient is on BP meds at home and you resume or decide to hold them, it is a problem that needs to be her. Same for CAD, COPD, HLD and so on)  Acute hypoxic respiratory failure Acute on chronic HFpEF decompensation Hypertension emergency -Secondary to noncoherent with home diuresis medications. -Continue IV Lasix  60 mg IV twice daily, repeat chest x-ray tomorrow -Strict I and O's and daily weight -Echocardiogram was done less than 6 months ago, will not repeat echocardiogram at time  Hypertension emergency with acute endorgan damage of acute CHF decompensation -Resume home BP meds including hydralazine , Imdur , Entresto  and spironolactone  and metoprolol  -Diuresis as above  CKD stage  II -Significant fluid overload, diuresis as above -Daily BMP  IDDM with insulin  resistance -Lantus  30 units daily -NovoLog  30 unit 3 times daily AC -SSI -Continue Farxiga  and Tradjenta   Deconditioning -PT evaluation  DVT prophylaxis: Lovenox  Code Status: Full code Family Communication: None at bedside Disposition Plan: Patient is sick with CHF decompensation requiring IV diuresis and close monitoring kidney function during the process, expect more than 2 midnight hospital stay Consults called: None Admission status: Tele admit   [place] MD [facility] Pager [phone]  08/16/2023, 9:07 AM

## 2023-08-16 NOTE — Progress Notes (Signed)
 PT Cancellation Note  Patient Details Name: Chad Greenia Sr. MRN: [id number] DOB: 11-14-1960   Cancelled Treatment:    Reason Eval/Treat Not Completed: Medical issues which prohibited therapy  Chart reviewed - on arrival pt BP 201/117 (consistently elevated this AM); contraindicated for exertional activity at this time. Will continue to follow and initiate services as pt medically appropriate to participate in therapy.   Janine Melbourne, PT, DPT Physical Therapist - [facility]  [facility]    Alias Villagran A Rolin Schult 08/16/2023, 12:29 PM

## 2023-08-16 NOTE — Progress Notes (Signed)
 Heart Failure Stewardship Pharmacy Note  PCP: Pcp, No PCP-Cardiologist: None  HPI: Chad Crafts Sr. is a 63 y.o. male with HTN, HLD, IDDM with insulin  resistance, chronic HFpEF, PAD, CKD stage III who presented with worsening shortness of breath and lower extremity edema. Patient reports taking medications as prescribed, but based on fill history appears to be nonadherent to several of his prescribed medications. Previously followed with Dr. Beau [facility], but recently transferred cardiac care to [facility], which may account for lapse in medication fills. Reports being diagnosed with CHF initially after receiving the COVID vaccination in 2021. On admission, BNP was 1149, HS-troponin was 36 > 37. Chest x-ray noted appearance compatible with moderate congestive heart failure.   Pertinent cardiac history: Stress test in 08/2020 noted LVEF of 40-45% and inferior wall hypokinesis associated with moderate perfusion abnormality. Cardiac MRI in 10/2022 noted diffuse patchy hyperenhancement of the basal septal and lateral walls, a pattern of scarring non-ischemic in nature and seen more in patients with history of uncontrolled hypertension, though HCM could not be excluded. The CMR findings probability for cardiac amyloidosis was low. TTE 03/13/23 showed LVEF of 50-55% with GIIIDD.  Pertinent Lab Values: Creatinine  Date Value Ref Range Status  05/08/2014 0.98 0.60 - 1.30 mg/dL Final   Creatinine, Ser  Date Value Ref Range Status  08/16/2023 1.64 (H) 0.61 - 1.24 mg/dL Final   BUN  Date Value Ref Range Status  08/16/2023 21 8 - 23 mg/dL Final  16/04/9603 10 7 - 18 mg/dL Final   Potassium  Date Value Ref Range Status  08/16/2023 4.1 3.5 - 5.1 mmol/L Final  05/08/2014 3.5 3.5 - 5.1 mmol/L Final   Sodium  Date Value Ref Range Status  08/16/2023 141 135 - 145 mmol/L Final  05/08/2014 138 136 - 145 mmol/L Final   B Natriuretic Peptide  Date Value Ref Range Status  08/16/2023 1,149.0  (H) 0.0 - 100.0 pg/mL Final    Comment:    Performed at [facility], [address] [place], [place] [address]   Magnesium  Date Value Ref Range Status  09/23/2022 1.9 1.7 - 2.4 mg/dL Final    Comment:    Performed at [facility], [address] [place], [place] [address]   Hgb A1c MFr Bld  Date Value Ref Range Status  03/13/2023 6.8 (H) 4.8 - 5.6 % Final    Comment:    (NOTE) Pre diabetes:          5.7%-6.4%  Diabetes:              >6.4%  Glycemic control for   <7.0% adults with diabetes     Vital Signs:  Temp:  [97.6 F (36.4 C)-98.2 F (36.8 C)] 98.2 F (36.8 C) (02/10 0702) Pulse Rate:  [77-88] 77 (02/10 0550) Resp:  [20-28] 28 (02/10 0550) BP: (196-201)/(105-107) 196/107 (02/10 0550) SpO2:  [87 %-98 %] 98 % (02/10 0550) Weight:  [82.6 kg (182 lb)] 82.6 kg (182 lb) (02/10 0312)  Intake/Output Summary (Last 24 hours) at 08/16/2023 1039 Last data filed at 08/16/2023 0551 Gross per 24 hour  Intake --  Output 900 ml  Net -900 ml    Current Heart Failure Medications:  Loop diuretic: furosemide  60 mg IV BID Beta-Blocker: metoprolol  succinate 100 mg daily ACEI/ARB/ARNI: Entresto  49/51 mg BID MRA: spironolactone  25 mg daily SGLT2i: none  Prior to admission Heart Failure Medications:  Loop diuretic: furosemide  60 mg daily (No fills since 03/2023) Beta-Blocker: metoprolol   succinate 100 mg daily (No fills since 03/2023) ACEI/ARB/ARNI: Entresto  24-26 mg BID (No fills since 03/2023) MRA: Spironolactone  25 mg daily (No fills since 03/2023) SGLT2i: Farxiga  10 mg daily  Assessment: 1. 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 NYHA class III symptoms.  -Symptoms: Reports shortness of breath and LEE. Appetite is fair. Extremities are warm.  -Volume: Appears volume overloaded on exam. JVP is up. LEE is present. Furosemide  80 mg IV given  once, now started on furosemide  60 mg IV BID. Reports running out of furosemide  ~7 days ago, but suspect he was not taking regularly prior to that time. -Hemodynamics: BP significantly elevated on admission. Home medications have been restarted, will follow trend. -BB: Metoprolol  succinate 100 mg daily. HR is in 70s. Will monitor for now. Given LVEF is now normal, may require backing down on BB pending HR. -ACEI/ARB/ARNI: Continue Entresto  49/51 mg BID. Will follow tomorrow to assess response to restarting medications. -MRA: Continue spironolactone  25 mg daily at target dose. -SGLT2i: Consider restarting home Farxiga  tomorrow pending response to other GDMT. Patient was taking this prior to admission.  Plan: 1) Medication changes recommended at this time: -None  2) Patient assistance: -Pending  3) Education: - Patient has been educated on current HF medications and potential additions to HF medication regimen - Patient verbalizes understanding that over the next few months, these medication doses may change and more medications may be added to optimize HF regimen - Patient has been educated on basic disease state pathophysiology and goals of therapy  Medication Assistance / Insurance Benefits Check: Does the patient have prescription insurance?    Type of insurance plan:  Does the patient qualify for medication assistance through manufacturers or grants? Pending  Outpatient Pharmacy: Prior to admission outpatient pharmacy: [facility]      Please do not hesitate to reach out with questions or concerns,  Bevely Brush, PharmD, CPP, BCPS Heart Failure Pharmacist  Phone - [phone] 08/16/2023 2:20 PM

## 2023-08-16 NOTE — ED Triage Notes (Signed)
 Patient coming to ED for evaluation of chest pain.  Reports hx of CHF and "feels like I have too much fluid on me.  I noticed by ankles swelling and it is getting hard to breath."  Pt reports he recently ran out of his "fluid medication" and was waiting until next doctor's appointment to get refill.  No reports of cough or fever

## 2023-08-16 NOTE — ED Provider Notes (Signed)
 [facility] Provider Note    Event Date/Time   First MD Initiated Contact with Patient 08/16/23 [id number]     (approximate)   History   Chest Pain   HPI  Chad Lonigro Sr. is a 63 y.o. male   Past medical history of CHF, diabetes, hyperlipidemia and hypertension presents to Emergency Department with orthopnea, exertional dyspnea, heaviness in his chest, peripheral edema over the last 1 week after not taking his diuretic for 1 week due to not being will get to the pharmacy for his refill.  He denies any respiratory infectious symptoms like cough, fever, nasal congestion or sore throat.  He is not on home oxygen but he is hypoxemic here on room air.   External Medical Documents Reviewed: Echo from 2024 shows an EF of 50 to 55% with diastolic dysfunction      Physical Exam   Triage Vital Signs: ED Triage Vitals  Encounter Vitals Group     BP 08/16/23 0317 (!) 201/105     Systolic BP Percentile --      Diastolic BP Percentile --      Pulse Rate 08/16/23 0317 88     Resp 08/16/23 0317 20     Temp 08/16/23 0317 97.6 F (36.4 C)     Temp Source 08/16/23 0317 Oral     SpO2 08/16/23 0317 (!) 87 %     Weight 08/16/23 0312 182 lb (82.6 kg)     Height 08/16/23 0312 5\' 6"  (1.676 m)     Head Circumference --      Peak Flow --      Pain Score 08/16/23 0312 10     Pain Loc --      Pain Education --      Exclude from Growth Chart --     Most recent vital signs: Vitals:   08/16/23 0317 08/16/23 0323  BP: (!) 201/105   Pulse: 88   Resp: 20   Temp: 97.6 F (36.4 C)   SpO2: (!) 87% 93%    General: Awake, no distress. CV:  Good peripheral perfusion.  Resp:  Tachypneic with rales at the bases.  O2 saturations 87% on room air but improved to the mid 90s on 3 L. Abd:  No distention.  Other:  Peripheral edema with pitting edema to bilateral ankles.  Hypertensive.   ED Results / Procedures / Treatments   Labs (all labs ordered are listed, but only  abnormal results are displayed) Labs Reviewed  BRAIN NATRIURETIC PEPTIDE  COMPREHENSIVE METABOLIC PANEL  CBC WITH DIFFERENTIAL/PLATELET  TROPONIN I (HIGH SENSITIVITY)    EKG  ED ECG REPORT I, Buell Carmin, the attending physician, personally viewed and interpreted this ECG.   Date: 08/16/2023  EKG Time: 0310  Rate: 89  Rhythm: sinus  Axis: nl  Intervals:none  ST&T Change: no stemi    RADIOLOGY I independently reviewed and interpreted chest x-ray and see opacities diffusely concerning for pulmonary edema I also reviewed radiologist's formal read.   PROCEDURES:  Critical Care performed: Yes, see critical care procedure note(s)  .Critical Care  Performed by: Buell Carmin, MD Authorized by: Buell Carmin, MD   Critical care provider statement:    Critical care time (minutes):  30   Critical care was time spent personally by me on the following activities:  Development of treatment plan with patient or surrogate, discussions with consultants, evaluation of patient's response to treatment, examination of patient, ordering and review of laboratory studies, ordering  and review of radiographic studies, ordering and performing treatments and interventions, pulse oximetry, re-evaluation of patient's condition and review of old charts    MEDICATIONS ORDERED IN ED: Medications  furosemide  (LASIX ) injection 80 mg (has no administration in time range)  nitroGLYCERIN  (NITROGLYN) 2 % ointment 1 inch (has no administration in time range)  aspirin  chewable tablet 324 mg (has no administration in time range)    External physician / consultants:  I spoke with hospital medicine for admission and regarding care plan for this patient.   IMPRESSION / MDM / ASSESSMENT AND PLAN / ED COURSE  I reviewed the triage vital signs and the nursing notes.                                Patient's presentation is most consistent with acute presentation with potential threat to life or bodily  function.  Differential diagnosis includes, but is not limited to, heart failure exacerbation, ACS, PE, respiratory infection   The patient is on the cardiac monitor to evaluate for evidence of arrhythmia and/or significant heart rate changes.  MDM:    This is a patient who looks fluid overloaded and has a history of not taking his diuretic for 1 week concerning for CHF exacerbation.  I will give him IV Lasix , keep him on O2 due to his hypoxemia, he is Nitropaste given his hypertension/CHF and will defer BiPAP at this time given his respiratory status is not so bad that I do not think he needs it at this time.  Will keep close monitor however.  He will be admitted.      FINAL CLINICAL IMPRESSION(S) / ED DIAGNOSES   Final diagnoses:  Acute on chronic congestive heart failure, unspecified heart failure type (HCC)  Acute hypoxemic respiratory failure (HCC)     Rx / DC Orders   ED Discharge Orders     None        Note:  This document was prepared using Dragon voice recognition software and may include unintentional dictation errors.    Buell Carmin, MD 08/16/23 [phone]

## 2023-08-16 NOTE — Progress Notes (Signed)
 OT Cancellation Note  Patient Details Name: Chad Dewberry Sr. MRN: [id number] DOB: [birth year]   Cancelled Treatment:    Reason Eval/Treat Not Completed: Medical issues which prohibited therapy. Chart reviewed - on arrival pt BP 201/117 (consistently elevated this AM); contraindicated for exertional activity at this time. Will continue to follow and initiate services as pt medically appropriate to participate in therapy.   Gordan Latina, M.S. OTR/L  08/16/23, 12:12 PM  ascom [phone]

## 2023-08-16 NOTE — ED Notes (Signed)
 CCMD called and patient setup for cardiac monitoring

## 2023-08-17 ENCOUNTER — Encounter: Payer: Self-pay | Admitting: Internal Medicine

## 2023-08-17 ENCOUNTER — Telehealth (HOSPITAL_COMMUNITY): Payer: Self-pay | Admitting: Pharmacy Technician

## 2023-08-17 ENCOUNTER — Inpatient Hospital Stay: Payer: BLUE CROSS/BLUE SHIELD

## 2023-08-17 ENCOUNTER — Other Ambulatory Visit (HOSPITAL_COMMUNITY): Payer: Self-pay

## 2023-08-17 DIAGNOSIS — N1831 Chronic kidney disease, stage 3a: Secondary | ICD-10-CM | POA: Diagnosis not present

## 2023-08-17 DIAGNOSIS — I161 Hypertensive emergency: Secondary | ICD-10-CM

## 2023-08-17 DIAGNOSIS — J9601 Acute respiratory failure with hypoxia: Secondary | ICD-10-CM | POA: Diagnosis not present

## 2023-08-17 DIAGNOSIS — I5033 Acute on chronic diastolic (congestive) heart failure: Secondary | ICD-10-CM | POA: Diagnosis not present

## 2023-08-17 DIAGNOSIS — Z794 Long term (current) use of insulin: Secondary | ICD-10-CM

## 2023-08-17 DIAGNOSIS — E1122 Type 2 diabetes mellitus with diabetic chronic kidney disease: Secondary | ICD-10-CM

## 2023-08-17 LAB — GLUCOSE, CAPILLARY
Glucose-Capillary: 86 mg/dL (ref 70–99)
Glucose-Capillary: 85 mg/dL (ref 70–99)

## 2023-08-17 LAB — BASIC METABOLIC PANEL
Anion gap: 9 (ref 5–15)
BUN: 25 mg/dL — ABNORMAL HIGH (ref 8–23)
CO2: 27 mmol/L (ref 22–32)
Calcium: 7.8 mg/dL — ABNORMAL LOW (ref 8.9–10.3)
Chloride: 105 mmol/L (ref 98–111)
Creatinine, Ser: 1.58 mg/dL — ABNORMAL HIGH (ref 0.61–1.24)
GFR, Estimated: 49 mL/min — ABNORMAL LOW (ref >60)
Glucose, Bld: 80 mg/dL (ref 70–99)
Potassium: 3.1 mmol/L — ABNORMAL LOW (ref 3.5–5.1)
Sodium: 141 mmol/L (ref 135–145)

## 2023-08-17 LAB — CBG MONITORING, ED
Glucose-Capillary: 72 mg/dL (ref 70–99)
Glucose-Capillary: 92 mg/dL (ref 70–99)

## 2023-08-17 NOTE — Plan of Care (Signed)

## 2023-08-17 NOTE — Progress Notes (Addendum)
Progress Note   Patient: Chad Snoke Sr. XBJ:[id number] DOB: 10-09-1960 DOA: 08/16/2023     1 DOS: the patient was seen and examined on 08/17/2023   Brief hospital course: on Develle Sievers Sr. is a 63 y.o. male with medical history significant of refractory HTN, HLD, IDDM with insulin resistance, chronic HFpEF, CKD stage III, presented with worsening of shortness of breath and leg swelling.   Patient is admitted to the hospitalist service for further management evaluation ofAcute hypoxic respiratory failure, acute on chronic diastolic CHF, hypertensive  emergency.  He is noncompliant with medication regimen states he ran out of his medications.  Assessment and Plan: Acute hypoxic respiratory failure Acute on chronic HFpEF decompensation Hypertensive emergency Non compliance with diuretics and home antihypertensive medications. Discussed with pharmacy, he will need scripts sent to our pharmacy here before discharge. Continue IV Lasix 60 mg IV twice daily. Continue to wean supplemental oxygen as tolerated. Patient's home blood pressure medications Imdur, Entresto, spironolactone and metoprolol resumed.  His blood pressure improved.  Continue to monitor closely. Monitor strict I and O's and daily weight Echocardiogram was done less than 6 months ago, EF 50-55%, grade 3 diastolic dysfunction.   CKD stage III Kidney function stable.  Creatinine around 1.5. Continue to monitor daily renal function as he is on diuresis.   Type 2 diabetes mellitus on insulin Started Semglee 30 units daily(he is on 70/30 20 units twice daily) Resumed home dose NovoLog 30 unit 3 times daily AC Continue checks, sliding scale insulin. Hold glipizide, metformin Continue Farxiga and Tradjenta   Physical deconditioning Encourage out of bed to chair. PT OT evaluation.     Out of bed to chair. Incentive spirometry. Nursing supportive care. Fall, aspiration precautions. DVT prophylaxis   Code Status: Full  Code  Subjective: Patient is seen and examined today morning.  Patient is in mild respiratory distress, currently requiring 2 L supplemental oxygen.  States that he ran out of Lasix and some heart meds.  Denies any chest pain.  Eating fair.  Physical Exam: Vitals:   08/17/23 0420 08/17/23 0500 08/17/23 0600 08/17/23 0843  BP:  133/74 137/63   Pulse:  (!) 59 69   Resp:  (!) 22 (!) 23   Temp: 98.1 F (36.7 C)   98.3 F (36.8 C)  TempSrc: Oral   Oral  SpO2:  94% 98%   Weight:      Height:        General - Elderly African American male, mild respiratory distress HEENT - PERRLA, EOMI, atraumatic head, non tender sinuses. Lung - Clear, basal rales, rhonchi, wheezes. Heart - S1, S2 heard, no murmurs, rubs, 2+ pedal edema. Abdomen - Soft, non tender, bowel sounds good Neuro - Alert, awake and oriented x 3, non focal exam. Skin - Warm and dry.  Data Reviewed:      Latest Ref Rng & Units 08/16/2023    3:40 AM 03/15/2023    2:31 AM 03/14/2023    3:48 AM  CBC  WBC 4.0 - 10.5 K/uL 8.0  6.6  7.3   Hemoglobin 13.0 - 17.0 g/dL 30.8  9.8  65.7   Hematocrit 39.0 - 52.0 % 31.2  28.8  29.8   Platelets 150 - 400 K/uL 246  264  265       Latest Ref Rng & Units 08/17/2023    4:14 AM 08/16/2023    3:40 AM 03/15/2023    2:31 AM  BMP  Glucose 70 - 99 mg/dL  80  227  144   BUN 8 - 23 mg/dL 25  21  29    Creatinine 0.61 - 1.24 mg/dL 1.61  0.96  0.45   Sodium 135 - 145 mmol/L 141  141  138   Potassium 3.5 - 5.1 mmol/L 3.1  4.1  3.7   Chloride 98 - 111 mmol/L 105  106  100   CO2 22 - 32 mmol/L 27  28  30    Calcium 8.9 - 10.3 mg/dL 7.8  8.6  8.6    DG Chest 1 View Result Date: 08/17/2023 CLINICAL DATA:  63 year old male with history of congestive heart failure. EXAM: CHEST  1 VIEW COMPARISON:  Chest x-ray 08/16/2023. FINDINGS: Diffuse interstitial prominence, peribronchial cuffing and patchy ill-defined opacities scattered throughout the lungs bilaterally with small bilateral pleural effusions (right  greater than left). Linear opacity in the left mid lung likely reflective of subsegmental atelectasis. Cephalization of the pulmonary vasculature. Mild cardiomegaly. Upper mediastinal contours are within normal limits. IMPRESSION: 1. The appearance the chest once again suggests congestive heart failure, as above. Electronically Signed   By: Trudie Reed M.D.   On: 08/17/2023 07:46   DG Chest Port 1 View Result Date: 08/16/2023 CLINICAL DATA:  63 year old male with history of shortness of breath. EXAM: PORTABLE CHEST 1 VIEW COMPARISON:  Chest x-ray 03/12/2023. FINDINGS: There is cephalization of the pulmonary vasculature, indistinctness of the interstitial markings, and patchy airspace disease throughout the lungs bilaterally suggestive of moderate pulmonary edema. Small bilateral pleural effusions. Mild cardiomegaly. Upper mediastinal contours are within normal limits. IMPRESSION: 1. The appearance of the chest is most compatible with moderate congestive heart failure, as above. Electronically Signed   By: Trudie Reed M.D.   On: 08/16/2023 05:55   Family Communication: Discussed with patient, he understand and agree. All questions answereed.  Disposition: Status is: Inpatient Remains inpatient appropriate because: CHF exacerbation, hypoxia  Planned Discharge Destination: Home with Home Health     Time spent: 39 minutes  Author: Marcelino Duster, MD 08/17/2023 8:58 AM Secure chat 7am to 7pm For on call review [email or web address].

## 2023-08-17 NOTE — Evaluation (Signed)
Physical Therapy Evaluation Patient Details Name: Chad Quesinberry Sr. MRN: [id number] DOB: [birth year] Today's Date: 08/17/2023  History of Present Illness  presented to ER secondary to SOB, LE edema; admitted for management of acute hypoxic respiratory failure secondary to acute/chronic CHF  Clinical Impression  Patient resting in bed upon arrival to room; alert and oriented, follows commands and agreeable to participation with treatment session. Denies pain; does endorse noted improvement in respiratory status since admission. Bilat UE/LE strength and ROM grossly symmetrical; no focal weakness appreciated.  Able to complete bed mobility indep; sit/stand, basic transfers and gait (225') without assist device, mod indep.  Demonstrates reciprocal stepping pattern with good step height/length; fair/good cadence and overall gait speed/mechanics. Completes dynamic gait components (head turns, start/stop, obstacle negotiation and head turns) with single L LOB (using LE step strategy for recovery). Minimal/no SOB with exertion; sats with brief dip to 88-89% on RA, but largely maintained >90% throughout  Appears to be at/near functional baseline with no acute PT needs identified. Do recommend consistent mobilization efforts in room/around unit (3x/day) throughout remaining hospital stay; patient voiced understanding and agreement.  Will complete initial order at this time; please reconsult should needs change.        If plan is discharge home, recommend the following:     Can travel by private vehicle        Equipment Recommendations    Recommendations for Other Services       Functional Status Assessment Patient has not had a recent decline in their functional status     Precautions / Restrictions Precautions Precautions: None Restrictions Weight Bearing Restrictions Per Provider Order: No      Mobility  Bed Mobility Overal bed mobility: Independent                   Transfers Overall transfer level: Independent Equipment used: None                    Ambulation/Gait Ambulation/Gait assistance: Modified independent (Device/Increase time) Gait Distance (Feet): 225 Feet Assistive device: None         General Gait Details: reciprocal stepping pattern with good step height/length; fair/good cadence and overall gait speed/mechanics.  Completes dynamic gait components (head turns, start/stop, obstacle negotiation and head turns) with single L LOB (using LE step strategy for recovery).  Minimal/no SOB with exertion; sats with brief dip to 88-89% on RA, but largely maintained >90% throughout  Stairs            Wheelchair Mobility     Tilt Bed    Modified Rankin (Stroke Patients Only)       Balance Overall balance assessment: Independent                                           Pertinent Vitals/Pain Pain Assessment Pain Assessment: No/denies pain    Home Living Family/patient expects to be discharged to:: Private residence Living Arrangements: Other relatives (brothers) Available Help at Discharge: Family Type of Home: Mobile home Home Access: Stairs to enter Entrance Stairs-Rails: Can reach both Entrance Stairs-Number of Steps: 3   Home Layout: One level Home Equipment: None      Prior Function Prior Level of Function : Independent/Modified Independent             Mobility Comments: Indep with ADls, household and community mobilization without assist  device; 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  Denies fall history; no home O2.       Extremity/Trunk Assessment   Upper Extremity Assessment Upper Extremity Assessment: Overall WFL for tasks assessed    Lower Extremity Assessment Lower Extremity Assessment: Overall WFL for tasks assessed       Communication   Communication Communication: No apparent difficulties    Cognition Arousal: Alert Behavior During Therapy: WFL  for tasks assessed/performed   PT - Cognitive impairments: No apparent impairments                         Following commands: Intact       Cueing       General Comments      Exercises Other Exercises Other Exercises: Ambulatory transfer to bathroom, standing balance for urination, mod indep; functional reach, mod indep   Assessment/Plan    PT Assessment Patient does not need any further PT services  PT Problem List         PT Treatment Interventions      PT Goals (Current goals can be found in the Care Plan section)  Acute Rehab PT Goals Patient Stated Goal: to go home PT Goal Formulation: All assessment and education complete, DC therapy Time For Goal Achievement: 08/17/23 Potential to Achieve Goals: Good    Frequency       Co-evaluation               AM-PAC PT "6 Clicks" Mobility  Outcome Measure Help needed turning from your back to your side while in a flat bed without using bedrails?: None Help needed moving from lying on your back to sitting on the side of a flat bed without using bedrails?: None Help needed moving to and from a bed to a chair (including a wheelchair)?: None Help needed standing up from a chair using your arms (e.g., wheelchair or bedside chair)?: None Help needed to walk in hospital room?: None Help needed climbing 3-5 steps with a railing? : None 6 Click Score: 24    End of Session   Activity Tolerance: Patient tolerated treatment well Patient left: in bed;with call bell/phone within reach   PT Visit Diagnosis: Difficulty in walking, not elsewhere classified (R26.2)    Time: 5784-6962 PT Time Calculation (min) (ACUTE ONLY): 14 min   Charges:   PT Evaluation $PT Eval Low Complexity: 1 Low   PT General Charges $$ ACUTE PT VISIT: 1 Visit         Tylique Aull H. Manson Passey, PT, DPT, NCS 08/17/23, 8:38 PM [phone]

## 2023-08-17 NOTE — Telephone Encounter (Signed)
Pharmacy Patient Advocate Encounter  Received notification from [facility] that Prior Authorization for Farxiga 10MG  tablets  has been CANCELLED due to   PA #/Case ID/Reference #: [id number]

## 2023-08-17 NOTE — Progress Notes (Signed)
Heart Failure Stewardship Pharmacy Note  PCP: Pcp, No PCP-Cardiologist: None  HPI: Chad Sargent Sr. is a 63 y.o. male with HTN, HLD, IDDM with insulin resistance, chronic HFpEF, PAD, CKD stage III who presented with worsening shortness of breath and lower extremity edema. Patient reports taking medications as prescribed, but based on fill history appears to be nonadherent to several of his prescribed medications. Previously followed with Dr. Juliann Pares [facility], but recently transferred cardiac care to [facility], which may account for lapse in medication fills. Reports being diagnosed with CHF initially after receiving the COVID vaccination in 2021. On admission, BNP was 1149, HS-troponin was 36 > 37. Chest x-ray noted appearance compatible with moderate congestive heart failure.   Pertinent cardiac history: Stress test in 08/2020 noted LVEF of 40-45% and inferior wall hypokinesis associated with moderate perfusion abnormality. Cardiac MRI in 10/2022 noted diffuse patchy hyperenhancement of the basal septal and lateral walls, a pattern of scarring non-ischemic in nature and seen more in patients with history of uncontrolled hypertension, though HCM could not be excluded. The CMR findings probability for cardiac amyloidosis was low. TTE 03/13/23 showed LVEF of 50-55% with GIIIDD.  Pertinent Lab Values: Creatinine  Date Value Ref Range Status  05/08/2014 0.98 0.60 - 1.30 mg/dL Final   Creatinine, Ser  Date Value Ref Range Status  08/17/2023 1.58 (H) 0.61 - 1.24 mg/dL Final   BUN  Date Value Ref Range Status  08/17/2023 25 (H) 8 - 23 mg/dL Final  29/56/2130 10 7 - 18 mg/dL Final   Potassium  Date Value Ref Range Status  08/17/2023 3.1 (L) 3.5 - 5.1 mmol/L Final  05/08/2014 3.5 3.5 - 5.1 mmol/L Final   Sodium  Date Value Ref Range Status  08/17/2023 141 135 - 145 mmol/L Final  05/08/2014 138 136 - 145 mmol/L Final   B Natriuretic Peptide  Date Value Ref Range Status  08/16/2023  1,149.0 (H) 0.0 - 100.0 pg/mL Final    Comment:    Performed at [facility], [address] [place], [place] [address]   Magnesium  Date Value Ref Range Status  09/23/2022 1.9 1.7 - 2.4 mg/dL Final    Comment:    Performed at [facility], [address] [place], [place] [address]   Hgb A1c MFr Bld  Date Value Ref Range Status  03/13/2023 6.8 (H) 4.8 - 5.6 % Final    Comment:    (NOTE) Pre diabetes:          5.7%-6.4%  Diabetes:              >6.4%  Glycemic control for   <7.0% adults with diabetes     Vital Signs:  Temp:  [97.8 F (36.6 C)-98.3 F (36.8 C)] 98.3 F (36.8 C) (02/11 0843) Pulse Rate:  [59-79] 68 (02/11 0917) Cardiac Rhythm: Normal sinus rhythm (02/11 0300) Resp:  [19-30] 20 (02/11 0917) BP: (128-201)/(63-117) 158/88 (02/11 0917) SpO2:  [92 %-100 %] 100 % (02/11 0917)  Intake/Output Summary (Last 24 hours) at 08/17/2023 1109 Last data filed at 08/17/2023 0947 Gross per 24 hour  Intake --  Output 3080 ml  Net -3080 ml    Current Heart Failure Medications:  Loop diuretic: furosemide 60 mg IV BID Beta-Blocker: metoprolol succinate 100 mg daily ACEI/ARB/ARNI: Entresto 49/51 mg BID MRA: spironolactone 25 mg daily SGLT2i: none  Prior to admission Heart Failure Medications:  Loop diuretic: furosemide 60 mg daily (No fills since 03/2023) Beta-Blocker: metoprolol succinate 100 mg  daily (No fills since 03/2023) ACEI/ARB/ARNI: Entresto 24-26 mg BID (No fills since 03/2023) MRA: Spironolactone 25 mg daily (No fills since 03/2023) SGLT2i: Farxiga 10 mg daily  Assessment: 1. 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 NYHA class III symptoms.  -Symptoms: Reports shortness of breath and LEE are now back to baseline. Appetite is good. Orthopnea is improved.  -Volume: Appears to be nearing euvolemia. JVP is down considerably from  yesterday. LEE is now only trace. Modest charted urine output on furosemide 60 mg IV BID, though doubt accuracy. Creatinine and BUN are stable today. Continue furosemide today, then consider transition to oral diuretics tomorrow.  -Hemodynamics: BP remains significantly elevated. HR is 50-60s. -BB: Metoprolol succinate 100 mg daily. HR is in 50-60s. Given LVEF is now normal, may require backing down on BB if HR continues to trend down. -ACEI/ARB/ARNI: BP elevated on Entresto 49/51 mg BID. Can consider increasing to target dose. -MRA: Continue spironolactone 25 mg daily at target dose. -SGLT2i: Consider restarting home [place] tomorrow pending response to other GDMT. Patient was taking this prior to admission.  Plan: 1) Medication changes recommended at this time: -Consider increasing Entresto to 97/103 mg twice daily  2) Patient assistance: -Pending  3) Education: - Patient has been educated on current HF medications and potential additions to HF medication regimen - Patient verbalizes understanding that over the next few months, these medication doses may change and more medications may be added to optimize HF regimen - Patient has been educated on basic disease state pathophysiology and goals of therapy  Medication Assistance / Insurance Benefits Check: Does the patient have prescription insurance?    Type of insurance plan:  Does the patient qualify for medication assistance through manufacturers or grants? Pending  Outpatient Pharmacy: Prior to admission outpatient pharmacy: [facility]   Is the patient willing to utilize a [facility] pharmacy at discharge?: Yes  Please do not hesitate to reach out with questions or concerns,  Enos Fling, PharmD, CPP, BCPS Heart Failure Pharmacist  Phone - [phone] 08/17/2023 11:09 AM

## 2023-08-17 NOTE — Progress Notes (Signed)
OT Cancellation Note  Patient Details Name: Chad Kunkle Sr. MRN: [id number] DOB: [birth year]   Cancelled Treatment:    Reason Eval/Treat Not Completed: OT screened, no needs identified, will sign off. Order received, chart reviewed. Pt back to baseline functional independence. No skilled OT needs identified. Will sign off. Please re-consult if additional needs arise.   Kathie Dike, M.S. OTR/L  08/17/23, 2:32 PM  ascom [phone]

## 2023-08-17 NOTE — Telephone Encounter (Signed)
Pharmacy Patient Advocate Encounter   Received notification that prior authorization for Farxiga 10MG  tablets is required/requested.   Insurance verification completed.   The patient is insured through [facility] .   Per test claim: PA required; PA submitted to above mentioned insurance via CoverMyMeds Key/confirmation #/EOC [id number] Status is pending

## 2023-08-17 NOTE — Progress Notes (Signed)
   08/17/23 1143  Readmission Prevention Plan - High Risk  Transportation Screening Complete  PCP or Specialist appointment within 5-7 days of discharge Complete Clinical research associate will schedule at time of d/c)  High Risk Social Work Consult for recovery care planning/counseling (includes patient and caregiver) Complete  High Risk Palliative Care Screening Not Applicable  Medication Review Complete (Uses [facility] in [place])   HRRA complete.   Admitted for: heart failure  Admitted from: Home  PCP: [facility]  Pharmacy: [facility] in [place]  Current home health/prior home health/DME: None   No additional TOC needs at this time.

## 2023-08-17 NOTE — Telephone Encounter (Signed)
Patient Product/process development scientist completed.    The patient is insured through [employer].     Ran test claim for Entresto 24-26 mg and the current 30 day co-pay is $4.00.  Ran test claim for Farxiga 10 mg and Requires Prior Authorization.  Ran test claim for Jardiance 10 mg and Requires Prior Authorization.  This test claim was processed through [facility]- copay amounts may vary at other pharmacies due to pharmacy/plan contracts, or as the patient moves through the different stages of their insurance plan.     Roland Earl, CPHT Pharmacy Technician III Certified Patient Advocate [facility] Patient Advocate Team Direct Number: [phone]  Fax: [phone]

## 2023-08-17 NOTE — Progress Notes (Signed)
Heart Failure Nurse Navigator Progress Note  PCP: Pcp, No PCP-Cardiologist: [facility] Admission Diagnosis: Acute on chronic congestive heart failure, unspecified heart failure type (HCC) Acute hypoxemic respiratory failure [facility]) Admitted from: Home  Presentation:   Chad Bliss Sr. presented with worsening shortness of breath and lower extremity edema. BNP was 1149. HS-troponin was 36 > 37. Chest x-ray noted appearance compatible with moderate congestive heart failure.  ECHO/ LVEF: 50-55% -03/13/2023  Clinical Course:  Past Medical History:  Diagnosis Date   Diabetes mellitus without complication (HCC)    High cholesterol    Hypertension    Stomach ulcer      Social History   Socioeconomic History   Marital status: Single    Spouse name: Not on file   Number of children: Not on file   Years of education: Not on file   Highest education level: Not on file  Occupational History   Occupation: group home  Tobacco Use   Smoking status: Every Day    Types: Cigars, Cigarettes    Start date: 07/07/1995   Smokeless tobacco: Never   Tobacco comments:    2-3 cigars/day  Substance and Sexual Activity   Alcohol use: No   Drug use: No   Sexual activity: Yes  Other Topics Concern   Not on file  Social History Narrative   Not on file   Social Drivers of Health   Financial Resource Strain: Medium Risk (10/19/2022)   Received from [facility], [facility]   Overall Financial Resource Strain (CARDIA)    Difficulty of Paying Living Expenses: Somewhat hard  Food Insecurity: No Food Insecurity (08/17/2023)   Hunger Vital Sign    Worried About Running Out of Food in the Last Year: Never true    Ran Out of Food in the Last Year: Never true  Transportation Needs: No Transportation Needs (08/17/2023)   PRAPARE - Administrator, Civil Service (Medical): No    Lack of Transportation (Non-Medical): No  Physical Activity: Inactive  (12/09/2022)   Received from [facility], [facility]   Exercise Vital Sign    Days of Exercise per Week: 0 days    Minutes of Exercise per Session: 0 min  Stress: Stress Concern Present (12/09/2022)   Received from [facility], [facility]   [employer] of Occupational Health - Occupational Stress Questionnaire    Feeling of Stress : To some extent  Social Connections: Unknown (08/17/2023)   Social Connection and Isolation Panel [NHANES]    Frequency of Communication with Friends and Family: More than three times a week    Frequency of Social Gatherings with Friends and Family: More than three times a week    Attends Religious Services: Never    Database administrator or Organizations: No    Attends Engineer, structural: Never    Marital Status: Patient declined   Water engineer and Provision:  Detailed education and instructions provided on heart failure disease management including the following:  Signs and symptoms of Heart Failure When to call the physician Importance of daily weights Low sodium diet Fluid restriction Medication management Anticipated future follow-up appointments  Patient education given on each of the above topics.  Patient acknowledges understanding via teach back method and acceptance of all instructions.  Education Materials:  "Living Better With Heart Failure" Booklet, HF zone tool, & Daily Weight Tracker Tool.  Patient has scale  at home: Yes Patient has pill box at home: Yes    High Risk Criteria for Readmission and/or Poor Patient Outcomes: Heart failure hospital admissions (last 6 months): 2  No Show rate: 8 Difficult social situation: None Demonstrates medication adherence: No Primary Language: English Literacy level: Reading, Writing & Comprehension  Barriers of Care:   Diet & Fluid Restrictions Daily weights Medication  Adherence  Considerations/Referrals:   Referral made to Heart Failure Pharmacist Stewardship: Yes Referral made to Heart Failure CSW/NCM TOC: No Referral made to Heart & Vascular TOC clinic: Yes. New TOC 08/23/23 @ 2:30 PM  Items for Follow-up on DC/TOC: Diet & fluid Restrictions Daily weights Medication Adherence Continued Heart Failure Education   Chad Horseman, RN, BSN [facility] Heart Failure Navigator Secure Chat Only

## 2023-08-18 ENCOUNTER — Encounter: Payer: Self-pay | Admitting: Internal Medicine

## 2023-08-18 DIAGNOSIS — I5033 Acute on chronic diastolic (congestive) heart failure: Secondary | ICD-10-CM | POA: Diagnosis not present

## 2023-08-18 LAB — GLUCOSE, CAPILLARY
Glucose-Capillary: 51 mg/dL — ABNORMAL LOW (ref 70–99)
Glucose-Capillary: 74 mg/dL (ref 70–99)
Glucose-Capillary: 122 mg/dL — ABNORMAL HIGH (ref 70–99)
Glucose-Capillary: 106 mg/dL — ABNORMAL HIGH (ref 70–99)
Glucose-Capillary: 179 mg/dL — ABNORMAL HIGH (ref 70–99)
Glucose-Capillary: 191 mg/dL — ABNORMAL HIGH (ref 70–99)

## 2023-08-18 LAB — BASIC METABOLIC PANEL
Anion gap: 12 (ref 5–15)
BUN: 31 mg/dL — ABNORMAL HIGH (ref 8–23)
CO2: 30 mmol/L (ref 22–32)
Calcium: 8.8 mg/dL — ABNORMAL LOW (ref 8.9–10.3)
Chloride: 97 mmol/L — ABNORMAL LOW (ref 98–111)
Creatinine, Ser: 2.06 mg/dL — ABNORMAL HIGH (ref 0.61–1.24)
GFR, Estimated: 36 mL/min — ABNORMAL LOW (ref >60)
Glucose, Bld: 50 mg/dL — ABNORMAL LOW (ref 70–99)
Potassium: 3.1 mmol/L — ABNORMAL LOW (ref 3.5–5.1)
Sodium: 139 mmol/L (ref 135–145)

## 2023-08-18 LAB — MAGNESIUM: Magnesium: 1.8 mg/dL (ref 1.7–2.4)

## 2023-08-18 LAB — HEMOGLOBIN A1C
Hgb A1c MFr Bld: 6.3 % — ABNORMAL HIGH (ref 4.8–5.6)
Mean Plasma Glucose: 134.11 mg/dL

## 2023-08-18 LAB — PHOSPHORUS: Phosphorus: 3.8 mg/dL (ref 2.5–4.6)

## 2023-08-18 MED ORDER — POTASSIUM CHLORIDE CRYS ER 20 MEQ PO TBCR
40.0000 meq | EXTENDED_RELEASE_TABLET | Freq: Once | ORAL | Status: AC
  Administered 2023-08-18: 40 meq via ORAL
  Filled 2023-08-18: qty 2

## 2023-08-18 MED ORDER — INSULIN ASPART 100 UNIT/ML IJ SOLN
0.0000 [IU] | Freq: Three times a day (TID) | INTRAMUSCULAR | Status: DC
  Filled 2023-08-18: qty 1

## 2023-08-18 MED ORDER — INSULIN ASPART 100 UNIT/ML IJ SOLN
0.0000 [IU] | Freq: Every day | INTRAMUSCULAR | Status: DC
  Administered 2023-08-19: 2 [IU] via SUBCUTANEOUS
  Filled 2023-08-18: qty 1

## 2023-08-18 MED ORDER — INSULIN GLARGINE-YFGN 100 UNIT/ML ~~LOC~~ SOLN
10.0000 [IU] | Freq: Every day | SUBCUTANEOUS | Status: DC
  Administered 2023-08-18: 10 [IU] via SUBCUTANEOUS
  Filled 2023-08-18 (×2): qty 0.1

## 2023-08-18 NOTE — Progress Notes (Signed)
Progress Note    Chad Granquist Sr.  WUJ:[id number] DOB: 1960/09/13  DOA: 08/16/2023 PCP: Pcp, No      Brief Narrative:    Medical records reviewed and are as summarized below:  Chad Bliss Sr. is a 63 y.o. male with medical history significant for refractory HTN, HLD, IDDM with insulin resistance, chronic HFpEF, CKD stage III, who presented to the hospital with shortness of breath and leg swelling.  He reported that he had ran out of his Lasix about a week prior to admission.       Assessment/Plan:   Principal Problem:   Acute on chronic diastolic CHF (congestive heart failure) (HCC) Active Problems:   CHF (congestive heart failure) (HCC)    Body mass index is 29.53 kg/m.   Acute on chronic HFpEF: Hold IV Lasix because of increasing creatinine.  Monitor BMP, daily weight and urine output. 2D echo in September 2024 showed EF estimated at 50 to 55%, grade 3 diastolic dysfunction   Hypertensive emergency: BP has improved.  Hold Entresto and Aldactone because of increasing creatinine. Continue hydralazine, Imdur and metoprolol.   Acute hypoxemic respiratory failure: Improved.  He is tolerating room air.   AKI on CKD stage IIIa: Creatinine up from 1.64-2.06.  Entresto, spironolactone and IV Lasix have been held.  Baseline creatinine is around 1.5.   Type 2 diabetes mellitus, hypoglycemia: Glucose dropped to 51 today.  Decreased insulin glargine from 30 units to 10 units daily.  Discontinue NovoLog 30 units scheduled with meals.  Decrease sliding scale insulin from moderate to very sensitive scale. Patient reported Home regimen: NovoLog 70/30 30 units twice daily and sometimes 30 units daily if glucose less than 100. Glucotrol 10 mg daily, Farxiga 10 mg daily, Tradjenta 5 mg daily, metformin 500 mg twice daily Hemoglobin A1c was 8.6 on 06/08/2023.   Hypokalemia: Potassium is still 3.1.  Replete potassium and monitor levels   Diet Order             Diet  heart healthy/carb modified Room service appropriate? Yes; Fluid consistency: Thin  Diet effective now                            Consultants: None  Procedures: None    Medications:    aspirin EC  81 mg Oral Daily   dapagliflozin propanediol  10 mg Oral Daily   enoxaparin (LOVENOX) injection  40 mg Subcutaneous Q24H   insulin aspart  0-15 Units Subcutaneous TID WC   insulin aspart  0-5 Units Subcutaneous QHS   insulin aspart  30 Units Subcutaneous TID WC   insulin glargine-yfgn  10 Units Subcutaneous Daily   isosorbide mononitrate  30 mg Oral Daily   linagliptin  5 mg Oral Daily   metoprolol succinate  100 mg Oral Daily   rosuvastatin  5 mg Oral Daily   sodium chloride flush  3 mL Intravenous Q12H   tamsulosin  0.4 mg Oral Daily   Continuous Infusions:   Anti-infectives (From admission, onward)    None              Family Communication/Anticipated D/C date and plan/Code Status   DVT prophylaxis: enoxaparin (LOVENOX) injection 40 mg Start: 08/16/23 1400     Code Status: Full Code  Family Communication: None Disposition Plan: Plan to discharge home   Status is: Inpatient Remains inpatient appropriate because: CHF exacerbation       Subjective:  Interval events noted.  No shortness of breath or chest pain.  He feels better.  IV nurse was at the bedside.  Objective:    Vitals:   08/18/23 0338 08/18/23 0500 08/18/23 0815 08/18/23 0819  BP: (!) 150/72  (!) 156/75 (!) 156/75  Pulse: 83  72 72  Resp: 17  20   Temp:   98.6 F (37 C)   TempSrc:   Oral   SpO2: 97%  96%   Weight:  83 kg    Height:       No data found.   Intake/Output Summary (Last 24 hours) at 08/18/2023 1048 Last data filed at 08/18/2023 0825 Gross per 24 hour  Intake 3 ml  Output --  Net 3 ml   Filed Weights   08/16/23 0312 08/18/23 0500  Weight: 82.6 kg 83 kg    Exam:  GEN: NAD SKIN: Warm and dry EYES: No pallor or icterus ENT: MMM CV:  RRR PULM: Bibasilar rales, no wheezing ABD: soft, ND, NT, +BS CNS: AAO x 3, non focal EXT: Trace bilateral leg edema        Data Reviewed:   I have personally reviewed following labs and imaging studies:  Labs: Labs show the following:   Basic Metabolic Panel: Recent Labs  Lab 08/16/23 0340 08/17/23 0414 08/18/23 0345  NA 141 141 139  K 4.1 3.1* 3.1*  CL 106 105 97*  CO2 28 27 30   GLUCOSE 227* 80 50*  BUN 21 25* 31*  CREATININE 1.64* 1.58* 2.06*  CALCIUM 8.6* 7.8* 8.8*  MG  --   --  1.8  PHOS  --   --  3.8   GFR Estimated Creatinine Clearance: 37.6 mL/min (A) (by C-G formula based on SCr of 2.06 mg/dL (H)). Liver Function Tests: Recent Labs  Lab 08/16/23 0340  AST 25  ALT 20  ALKPHOS 254*  BILITOT 1.2  PROT 7.3  ALBUMIN 3.2*   No results for input(s): "LIPASE", "AMYLASE" in the last 168 hours. No results for input(s): "AMMONIA" in the last 168 hours. Coagulation profile No results for input(s): "INR", "PROTIME" in the last 168 hours.  CBC: Recent Labs  Lab 08/16/23 0340  WBC 8.0  NEUTROABS 5.4  HGB 10.7*  HCT 31.2*  MCV 92.0  PLT 246   Cardiac Enzymes: No results for input(s): "CKTOTAL", "CKMB", "CKMBINDEX", "TROPONINI" in the last 168 hours. BNP (last 3 results) No results for input(s): "PROBNP" in the last 8760 hours. CBG: Recent Labs  Lab 08/17/23 1825 08/17/23 2141 08/18/23 0811 08/18/23 0817 08/18/23 1033  GLUCAP 86 85 51* 74 122*   D-Dimer: No results for input(s): "DDIMER" in the last 72 hours. Hgb A1c: No results for input(s): "HGBA1C" in the last 72 hours. Lipid Profile: No results for input(s): "CHOL", "HDL", "LDLCALC", "TRIG", "CHOLHDL", "LDLDIRECT" in the last 72 hours. Thyroid function studies: No results for input(s): "TSH", "T4TOTAL", "T3FREE", "THYROIDAB" in the last 72 hours.  Invalid input(s): "FREET3" Anemia work up: No results for input(s): "VITAMINB12", "FOLATE", "FERRITIN", "TIBC", "IRON", "RETICCTPCT" in  the last 72 hours. Sepsis Labs: Recent Labs  Lab 08/16/23 0340  WBC 8.0    Microbiology No results found for this or any previous visit (from the past 240 hours).  Procedures and diagnostic studies:  DG Chest 1 View Result Date: 08/17/2023 CLINICAL DATA:  63 year old male with history of congestive heart failure. EXAM: CHEST  1 VIEW COMPARISON:  Chest x-ray 08/16/2023. FINDINGS: Diffuse interstitial prominence, peribronchial cuffing and patchy ill-defined  opacities scattered throughout the lungs bilaterally with small bilateral pleural effusions (right greater than left). Linear opacity in the left mid lung likely reflective of subsegmental atelectasis. Cephalization of the pulmonary vasculature. Mild cardiomegaly. Upper mediastinal contours are within normal limits. IMPRESSION: 1. The appearance the chest once again suggests congestive heart failure, as above. Electronically Signed   By: Trudie Reed M.D.   On: 08/17/2023 07:46               LOS: 2 days   Enedina Pair  [facility]   Pager on [email or web address]. If 7PM-7AM, please contact night-coverage at [email or web address]     08/18/2023, 10:48 AM

## 2023-08-18 NOTE — Inpatient Diabetes Management (Signed)
Inpatient Diabetes Program Recommendations  AACE/ADA: New Consensus Statement on Inpatient Glycemic Control (2015)  Target Ranges:  Prepandial:   less than 140 mg/dL      Peak postprandial:   less than 180 mg/dL (1-2 hours)      Critically ill patients:  140 - 180 mg/dL   Lab Results  Component Value Date   GLUCAP 74 08/18/2023   HGBA1C 6.8 (H) 03/13/2023    Latest Reference Range & Units 08/17/23 07:58 08/17/23 11:34 08/17/23 18:25 08/17/23 21:41 08/18/23 08:11 08/18/23 08:17  Glucose-Capillary 70 - 99 mg/dL 72 92 86 85 51 (L) 74  (L): Data is abnormally low   Diabetes history: DM2 Outpatient Diabetes medications: 70/30 20 units bid (equals approximately 28 units basal, 12 units meal coverage), Glucotrol 10 mg daily, Farxiga 10 mg daily, Tradjenta 5 daily, Metformin 500 mg bid Current orders for Inpatient glycemic control: Semglee 10 units daily, Novolog 30 units tid meal coverage, Novolog 0-15 units tid, 0-5 units hs correction, Farxiga 10 mg daily, Tradjenta 5 mg daily  Inpatient Diabetes Program Recommendations:   Noted Semglee decreased to 10 units daily. Patient has not received Novolog meal coverage since admitted due to low CBGs. Please consider: -D/C Novolog meal coverage and readjust if Postprandial CBGs >180. -Decrease Novolog correction to 0-6 units tid, 0-5 units hs  Thank you, Darel Hong E. Garrell Flagg, RN, MSN, CDCES  Diabetes Coordinator Inpatient Glycemic Control Team Team Pager [phone] (8am-5pm) 08/18/2023 9:55 AM

## 2023-08-18 NOTE — Plan of Care (Signed)

## 2023-08-18 NOTE — TOC Progression Note (Signed)
Transition of Care (TOC) - Progression Note    Patient Details  Name: Jurrell Royster Sr. MRN: [id number] Date of Birth: 1961/06/22  Transition of Care [facility]) CM/SW Contact  Truddie Hidden, RN Phone Number: 08/18/2023, 10:33 AM  Clinical Narrative:    TOC continuing to follow patient's progress throughout discharge planning.        Expected Discharge Plan and Services                                               Social Determinants of Health (SDOH) Interventions SDOH Screenings   Food Insecurity: No Food Insecurity (08/17/2023)  Housing: Low Risk  (08/17/2023)  Transportation Needs: No Transportation Needs (08/17/2023)  Utilities: Not At Risk (08/17/2023)  Depression (PHQ2-9): Low Risk  (09/08/2021)  Financial Resource Strain: Medium Risk (10/19/2022)   Received from [facility], [facility]  Physical Activity: Inactive (12/09/2022)   Received from [facility], [facility]  Social Connections: Unknown (08/17/2023)  Stress: Stress Concern Present (12/09/2022)   Received from [facility], [facility]  Tobacco Use: High Risk (08/17/2023)  Health Literacy: Inadequate Health Literacy (12/09/2022)   Received from [facility], [facility]    Readmission Risk Interventions    08/17/2023   11:43 AM  Readmission Risk Prevention Plan  Transportation Screening Complete  PCP or Specialist Appt within 3-5 Days Complete  Social Work Consult for Recovery Care Planning/Counseling Complete  Palliative Care Screening Not Applicable  Medication Review Oceanographer) Complete

## 2023-08-19 DIAGNOSIS — I5033 Acute on chronic diastolic (congestive) heart failure: Secondary | ICD-10-CM | POA: Diagnosis not present

## 2023-08-19 LAB — GLUCOSE, CAPILLARY
Glucose-Capillary: 73 mg/dL (ref 70–99)
Glucose-Capillary: 53 mg/dL — ABNORMAL LOW (ref 70–99)
Glucose-Capillary: 119 mg/dL — ABNORMAL HIGH (ref 70–99)
Glucose-Capillary: 118 mg/dL — ABNORMAL HIGH (ref 70–99)
Glucose-Capillary: 180 mg/dL — ABNORMAL HIGH (ref 70–99)
Glucose-Capillary: 202 mg/dL — ABNORMAL HIGH (ref 70–99)

## 2023-08-19 LAB — BASIC METABOLIC PANEL
Anion gap: 10 (ref 5–15)
BUN: 30 mg/dL — ABNORMAL HIGH (ref 8–23)
CO2: 30 mmol/L (ref 22–32)
Calcium: 8.8 mg/dL — ABNORMAL LOW (ref 8.9–10.3)
Chloride: 100 mmol/L (ref 98–111)
Creatinine, Ser: 2.08 mg/dL — ABNORMAL HIGH (ref 0.61–1.24)
GFR, Estimated: 35 mL/min — ABNORMAL LOW (ref >60)
Glucose, Bld: 40 mg/dL — CL (ref 70–99)
Potassium: 3.1 mmol/L — ABNORMAL LOW (ref 3.5–5.1)
Sodium: 140 mmol/L (ref 135–145)

## 2023-08-19 MED ORDER — LACTATED RINGERS IV BOLUS
250.0000 mL | Freq: Once | INTRAVENOUS | Status: AC
  Administered 2023-08-19: 250 mL via INTRAVENOUS

## 2023-08-19 MED ORDER — POTASSIUM CHLORIDE CRYS ER 20 MEQ PO TBCR
40.0000 meq | EXTENDED_RELEASE_TABLET | Freq: Once | ORAL | Status: AC
  Administered 2023-08-19: 40 meq via ORAL
  Filled 2023-08-19: qty 2

## 2023-08-19 NOTE — Progress Notes (Addendum)
Progress Note    Chad Dresch Sr.  ZOX:[id number] DOB: 1960-12-31  DOA: 08/16/2023 PCP: Pcp, No      Brief Narrative:    Medical records reviewed and are as summarized below:  Chad Bliss Sr. is a 63 y.o. male with medical history significant for refractory HTN, HLD, IDDM with insulin resistance, chronic HFpEF, CKD stage III, who presented to the hospital with shortness of breath and leg swelling.  He reported that he had ran out of his Lasix about a week prior to admission.       Assessment/Plan:   Principal Problem:   Acute on chronic diastolic CHF (congestive heart failure) (HCC) Active Problems:   CHF (congestive heart failure) (HCC)    Body mass index is 28.29 kg/m.   Acute on chronic HFpEF: Lasix has been held because creatinine is elevated above baseline.  Monitor BMP, daily weight and urine output. 2D echo in September 2024 showed EF estimated at 50 to 55%, grade 3 diastolic dysfunction   Hypertensive emergency: BP has improved.  Hold Entresto and Aldactone because of increasing creatinine. Continue hydralazine, Imdur and metoprolol.   Acute hypoxemic respiratory failure: Improved.  He is tolerating room air.   AKI on CKD stage IIIa: Creatinine up from 1.64-2.06-2.08.   Give Ringer's lactate 250 mL bolus for AKI Entresto, spironolactone and IV Lasix have been held.  Baseline creatinine is around 1.5. Continue to monitor.   Type 2 diabetes mellitus, hypoglycemia: Glucose dropped to 40 today.   Discontinue insulin glargine and Tradjenta.  Use correction scale NovoLog for hyperglycemia.  Home dose of insulin may have to be decreased.  Patient reported Home regimen: NovoLog 70/30 30 units twice daily and sometimes 30 units daily if glucose less than 100. Glucotrol 10 mg daily, Farxiga 10 mg daily, Tradjenta 5 mg daily, metformin 500 mg twice daily Hemoglobin A1c was 8.6 on 06/08/2023.   Hypokalemia: Continue potassium repletion.   Diet  Order             Diet heart healthy/carb modified Room service appropriate? Yes; Fluid consistency: Thin  Diet effective now                            Consultants: None  Procedures: None    Medications:    aspirin EC  81 mg Oral Daily   dapagliflozin propanediol  10 mg Oral Daily   enoxaparin (LOVENOX) injection  40 mg Subcutaneous Q24H   insulin aspart  0-5 Units Subcutaneous QHS   insulin aspart  0-6 Units Subcutaneous TID WC   isosorbide mononitrate  30 mg Oral Daily   metoprolol succinate  100 mg Oral Daily   potassium chloride  40 mEq Oral Once   rosuvastatin  5 mg Oral Daily   sodium chloride flush  3 mL Intravenous Q12H   tamsulosin  0.4 mg Oral Daily   Continuous Infusions:  lactated ringers       Anti-infectives (From admission, onward)    None              Family Communication/Anticipated D/C date and plan/Code Status   DVT prophylaxis: enoxaparin (LOVENOX) injection 40 mg Start: 08/16/23 1400     Code Status: Full Code  Family Communication: None Disposition Plan: Plan to discharge home   Status is: Inpatient Remains inpatient appropriate because: CHF exacerbation       Subjective:   Interval events noted.  Patient had  low glucose of 40 early this morning.  Reportedly, with sweating and shaking. he was symptomatic he was given orange juice and glucose improved to 73.  Currently, he has no complaints.  He feels better.  No shortness of breath or chest pain.  Objective:    Vitals:   08/19/23 0500 08/19/23 0757 08/19/23 1205 08/19/23 1400  BP:  (!) 181/95 (!) 168/95 (!) 134/102  Pulse:  68 69   Resp:      Temp:  97.6 F (36.4 C)    TempSrc:      SpO2:  99% 96%   Weight: 79.5 kg     Height:       No data found.   Intake/Output Summary (Last 24 hours) at 08/19/2023 1512 Last data filed at 08/19/2023 1441 Gross per 24 hour  Intake 360 ml  Output --  Net 360 ml   Filed Weights   08/16/23 0312 08/18/23  0500 08/19/23 0500  Weight: 82.6 kg 83 kg 79.5 kg    Exam:  GEN: NAD SKIN: Warm and dry.  Chronic scabbed wounds on bilateral shin (from a fall in 2022) EYES: No pallor or icterus ENT: MMM CV: RRR PULM: CTA B ABD: soft, ND, NT, +BS CNS: AAO x 3, non focal EXT: No edema or tenderness           Data Reviewed:   I have personally reviewed following labs and imaging studies:  Labs: Labs show the following:   Basic Metabolic Panel: Recent Labs  Lab 08/16/23 0340 08/17/23 0414 08/18/23 0345 08/19/23 0515  NA 141 141 139 140  K 4.1 3.1* 3.1* 3.1*  CL 106 105 97* 100  CO2 28 27 30 30   GLUCOSE 227* 80 50* 40*  BUN 21 25* 31* 30*  CREATININE 1.64* 1.58* 2.06* 2.08*  CALCIUM 8.6* 7.8* 8.8* 8.8*  MG  --   --  1.8  --   PHOS  --   --  3.8  --    GFR Estimated Creatinine Clearance: 36.5 mL/min (A) (by C-G formula based on SCr of 2.08 mg/dL (H)). Liver Function Tests: Recent Labs  Lab 08/16/23 0340  AST 25  ALT 20  ALKPHOS 254*  BILITOT 1.2  PROT 7.3  ALBUMIN 3.2*   No results for input(s): "LIPASE", "AMYLASE" in the last 168 hours. No results for input(s): "AMMONIA" in the last 168 hours. Coagulation profile No results for input(s): "INR", "PROTIME" in the last 168 hours.  CBC: Recent Labs  Lab 08/16/23 0340  WBC 8.0  NEUTROABS 5.4  HGB 10.7*  HCT 31.2*  MCV 92.0  PLT 246   Cardiac Enzymes: No results for input(s): "CKTOTAL", "CKMB", "CKMBINDEX", "TROPONINI" in the last 168 hours. BNP (last 3 results) No results for input(s): "PROBNP" in the last 8760 hours. CBG: Recent Labs  Lab 08/18/23 2016 08/19/23 0648 08/19/23 0800 08/19/23 0850 08/19/23 1210  GLUCAP 191* 73 53* 119* 118*   D-Dimer: No results for input(s): "DDIMER" in the last 72 hours. Hgb A1c: Recent Labs    08/18/23 0345  HGBA1C 6.3*   Lipid Profile: No results for input(s): "CHOL", "HDL", "LDLCALC", "TRIG", "CHOLHDL", "LDLDIRECT" in the last 72 hours. Thyroid function  studies: No results for input(s): "TSH", "T4TOTAL", "T3FREE", "THYROIDAB" in the last 72 hours.  Invalid input(s): "FREET3" Anemia work up: No results for input(s): "VITAMINB12", "FOLATE", "FERRITIN", "TIBC", "IRON", "RETICCTPCT" in the last 72 hours. Sepsis Labs: Recent Labs  Lab 08/16/23 0340  WBC 8.0    Microbiology  No results found for this or any previous visit (from the past 240 hours).  Procedures and diagnostic studies:  No results found.              LOS: 3 days   Aditya Nastasi  [facility]   Pager on [email or web address]. If 7PM-7AM, please contact night-coverage at [email or web address]     08/19/2023, 3:12 PM

## 2023-08-19 NOTE — Progress Notes (Signed)
Heart Failure Stewardship Pharmacy Note  PCP: Pcp, No PCP-Cardiologist: None  HPI: Chad Labat Sr. is a 63 y.o. male with HTN, HLD, IDDM with insulin resistance, chronic HFpEF, PAD, CKD stage III who presented with worsening shortness of breath and lower extremity edema. Patient reports taking medications as prescribed, but based on fill history appears to be nonadherent to several of his prescribed medications. Previously followed with Dr. Juliann Pares with [facility], but recently transferred cardiac [facility] to [facility], which may account for lapse in medication fills. Reports being diagnosed with CHF initially after receiving the COVID vaccination in 2021. On admission, BNP was 1149, HS-troponin was 36 > 37. Chest x-ray noted appearance compatible with moderate congestive heart failure.   Pertinent cardiac history: Stress test in 08/2020 noted LVEF of 40-45% and inferior wall hypokinesis associated with moderate perfusion abnormality. Cardiac MRI in 10/2022 noted diffuse patchy hyperenhancement of the basal septal and lateral walls, a pattern of scarring non-ischemic in nature and seen more in patients with history of uncontrolled hypertension, though HCM could not be excluded. The CMR findings probability for cardiac amyloidosis was low. TTE 03/13/23 showed LVEF of 50-55% with GIIIDD.  Pertinent Lab Values: Creatinine  Date Value Ref Range Status  05/08/2014 0.98 0.60 - 1.30 mg/dL Final   Creatinine, Ser  Date Value Ref Range Status  08/19/2023 2.08 (H) 0.61 - 1.24 mg/dL Final   BUN  Date Value Ref Range Status  08/19/2023 30 (H) 8 - 23 mg/dL Final  09/81/1914 10 7 - 18 mg/dL Final   Potassium  Date Value Ref Range Status  08/19/2023 3.1 (L) 3.5 - 5.1 mmol/L Final  05/08/2014 3.5 3.5 - 5.1 mmol/L Final   Sodium  Date Value Ref Range Status  08/19/2023 140 135 - 145 mmol/L Final  05/08/2014 138 136 - 145 mmol/L Final   B Natriuretic Peptide  Date Value Ref Range Status  08/16/2023  1,149.0 (H) 0.0 - 100.0 pg/mL Final    Comment:    Performed at [facility], [address] [place], [place] [address]   Magnesium  Date Value Ref Range Status  08/18/2023 1.8 1.7 - 2.4 mg/dL Final    Comment:    Performed at [facility], [address] [place], [place] [address]   Hgb A1c MFr Bld  Date Value Ref Range Status  08/18/2023 6.3 (H) 4.8 - 5.6 % Final    Comment:    (NOTE) Pre diabetes:          5.7%-6.4%  Diabetes:              >6.4%  Glycemic control for   <7.0% adults with diabetes     Vital Signs:  Temp:  [97.6 F (36.4 C)-98.5 F (36.9 C)] 97.6 F (36.4 C) (02/13 0757) Pulse Rate:  [63-72] 68 (02/13 0757) Cardiac Rhythm: Normal sinus rhythm (02/13 0723) Resp:  [16-18] 18 (02/13 0458) BP: (141-181)/(76-95) 181/95 (02/13 0757) SpO2:  [95 %-100 %] 99 % (02/13 0757) Weight:  [79.5 kg (175 lb 4.3 oz)] 79.5 kg (175 lb 4.3 oz) (02/13 0500)  Intake/Output Summary (Last 24 hours) at 08/19/2023 1030 Last data filed at 08/18/2023 1056 Gross per 24 hour  Intake 240 ml  Output --  Net 240 ml    Current Heart Failure Medications:  Loop diuretic: furosemide 60 mg IV BID Beta-Blocker: metoprolol succinate 100 mg daily ACEI/ARB/ARNI: Entresto 49/51 mg BID MRA: spironolactone 25 mg daily SGLT2i: none  Prior to admission Heart Failure Medications:  Loop diuretic: furosemide 60 mg daily (No fills since 03/2023) Beta-Blocker: metoprolol succinate 100 mg daily (No fills since 03/2023) ACEI/ARB/ARNI: Entresto 24-26 mg BID (No fills since 03/2023) MRA: Spironolactone 25 mg daily (No fills since 03/2023) SGLT2i: Farxiga 10 mg daily  Assessment: 1. 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 NYHA class III symptoms.  -Symptoms: Reports shortness of breath and LEE are now back to baseline. Appetite is good. Orthopnea is improved.   -Volume: Appears to be euvolemic.  -Hemodynamics: BP remains significantly elevated. HR is 60s. -BB: Metoprolol succinate 100 mg daily. HR is in 50-60s. Given LVEF is now normal, may require backing down on BB if HR continues to trend down. -ACEI/ARB/ARNI: BP remained elevated on Entresto 49/51 mg BID, however Entresto held for AKI after overdiuresis. Would resume when AKI resolves. -MRA: Spironolactone 25 mg daily held with AKI. Can consider resuming tomorrow if creatinine improves. -SGLT2i: Farxiga 10 mg daily -Patient is hypoglycemic this morning. Basal insulin, meal insulin, and linagliptan have been held.  Plan: 1) Medication changes recommended at this time: -Agree with holding medications during AKI. As needed hydralazine is ordered in the meantime.  2) Patient assistance: -Pending  3) Education: - Patient has been educated on current HF medications and potential additions to HF medication regimen - Patient verbalizes understanding that over the next few months, these medication doses may change and more medications may be added to optimize HF regimen - Patient has been educated on basic disease state pathophysiology and goals of therapy  Medication Assistance / Insurance Benefits Check: Does the patient have prescription insurance?    Type of insurance plan:  Does the patient qualify for medication assistance through manufacturers or grants? Pending  Outpatient Pharmacy: Prior to admission outpatient pharmacy: [facility]   Is the patient willing to utilize a [facility] pharmacy at discharge?: Yes  Please do not hesitate to reach out with questions or concerns,  Enos Fling, PharmD, CPP, BCPS Heart Failure Pharmacist  Phone - [phone] 08/19/2023 10:30 AM

## 2023-08-19 NOTE — Progress Notes (Signed)
Notified by lab of critical glucose result of 40. Upon assessment, patient c/o being sweaty, warm, and shaky. Per patient, NT provided orange juice on rounds. Fingerstick CBG 73.   Lamonte Richer, RN

## 2023-08-20 ENCOUNTER — Other Ambulatory Visit (HOSPITAL_COMMUNITY): Payer: Self-pay

## 2023-08-20 ENCOUNTER — Other Ambulatory Visit: Payer: Self-pay

## 2023-08-20 ENCOUNTER — Telehealth (HOSPITAL_COMMUNITY): Payer: Self-pay | Admitting: Pharmacy Technician

## 2023-08-20 ENCOUNTER — Telehealth: Payer: Self-pay | Admitting: Family

## 2023-08-20 DIAGNOSIS — I5033 Acute on chronic diastolic (congestive) heart failure: Secondary | ICD-10-CM | POA: Diagnosis not present

## 2023-08-20 LAB — GLUCOSE, CAPILLARY: Glucose-Capillary: 157 mg/dL — ABNORMAL HIGH (ref 70–99)

## 2023-08-20 LAB — BASIC METABOLIC PANEL
Anion gap: 7 (ref 5–15)
BUN: 28 mg/dL — ABNORMAL HIGH (ref 8–23)
CO2: 28 mmol/L (ref 22–32)
Calcium: 8.6 mg/dL — ABNORMAL LOW (ref 8.9–10.3)
Chloride: 104 mmol/L (ref 98–111)
Creatinine, Ser: 1.71 mg/dL — ABNORMAL HIGH (ref 0.61–1.24)
GFR, Estimated: 45 mL/min — ABNORMAL LOW (ref >60)
Glucose, Bld: 153 mg/dL — ABNORMAL HIGH (ref 70–99)
Potassium: 4.2 mmol/L (ref 3.5–5.1)
Sodium: 139 mmol/L (ref 135–145)

## 2023-08-20 LAB — MAGNESIUM: Magnesium: 2.2 mg/dL (ref 1.7–2.4)

## 2023-08-20 MED ORDER — FUROSEMIDE 20 MG PO TABS
60.0000 mg | ORAL_TABLET | Freq: Every day | ORAL | 0 refills | Status: DC
  Filled 2023-08-20: qty 90, 30d supply, fill #0

## 2023-08-20 MED ORDER — INSULIN NPH ISOPHANE & REGULAR (70-30) 100 UNIT/ML ~~LOC~~ SUSP
15.0000 [IU] | Freq: Two times a day (BID) | SUBCUTANEOUS | 0 refills | Status: AC
  Filled 2023-08-20: qty 10, 33d supply, fill #0

## 2023-08-20 MED ORDER — METOPROLOL SUCCINATE ER 100 MG PO TB24
100.0000 mg | ORAL_TABLET | Freq: Every day | ORAL | 0 refills | Status: AC
  Filled 2023-08-20: qty 30, 30d supply, fill #0

## 2023-08-20 MED ORDER — SPIRONOLACTONE 25 MG PO TABS
25.0000 mg | ORAL_TABLET | Freq: Every day | ORAL | 0 refills | Status: AC
  Filled 2023-08-20: qty 30, 30d supply, fill #0

## 2023-08-20 MED ORDER — ISOSORBIDE MONONITRATE ER 30 MG PO TB24
30.0000 mg | ORAL_TABLET | Freq: Every day | ORAL | 0 refills | Status: DC
  Filled 2023-08-20: qty 30, 30d supply, fill #0

## 2023-08-20 MED ORDER — HYDRALAZINE HCL 50 MG PO TABS
50.0000 mg | ORAL_TABLET | Freq: Three times a day (TID) | ORAL | 0 refills | Status: DC | PRN
  Filled 2023-08-20: qty 90, 30d supply, fill #0

## 2023-08-20 MED ORDER — TAMSULOSIN HCL 0.4 MG PO CAPS
0.4000 mg | ORAL_CAPSULE | Freq: Every day | ORAL | 0 refills | Status: DC
  Filled 2023-08-20: qty 30, 30d supply, fill #0

## 2023-08-20 MED ORDER — SACUBITRIL-VALSARTAN 49-51 MG PO TABS
1.0000 | ORAL_TABLET | Freq: Two times a day (BID) | ORAL | 0 refills | Status: AC
  Filled 2023-08-20: qty 60, 30d supply, fill #0

## 2023-08-20 NOTE — Discharge Summary (Signed)
Physician Discharge Summary   Patient: Chad Motyka Sr. MRN: [id number] DOB: 06-Oct-1960  Admit date:     08/16/2023  Discharge date: 08/20/23  Discharge Physician: Lurene Shadow   PCP: Pcp, No   Recommendations at discharge:    Follow-up with PCP in 1 week  Discharge Diagnoses: Principal Problem:   Acute on chronic diastolic CHF (congestive heart failure) (HCC) Active Problems:   CHF (congestive heart failure) (HCC)  Resolved Problems:   * No resolved hospital problems. *  Hospital Course:  Chad Buysse Sr. is a 63 y.o. male with medical history significant for refractory HTN, HLD, IDDM with insulin resistance, chronic HFpEF, CKD stage III, who presented to the hospital with shortness of breath and leg swelling.  He reported that he had ran out of his Lasix about a week prior to admission.   Assessment and Plan:  Acute on chronic HFpEF: Improved.  Resume Lasix at discharge.   2D echo in September 2024 showed EF estimated at 50 to 55%, grade 3 diastolic dysfunction     Hypertensive emergency: BP has improved.  Resume Entresto and Aldactone at discharge.  Continue hydralazine, Imdur and metoprolol.     Acute hypoxemic respiratory failure: Improved.  He is tolerating room air.     AKI on CKD stage IIIa: Improving.  1.64-2.06-2.08-1.71.   S/p treatment with IV Ringer's lactate bolus. Follow-up BMP with PCP as an outpatient.     Type 2 diabetes mellitus, hypoglycemia: Glucose levels have stabilized.  Decreased home dose of insulin 70/30 from 30 units twice daily to 20 units twice daily.  Continue Farxiga.  Discontinue glipizide, metformin and Tradjenta at discharge.  Hemoglobin A1c was 8.6 on 06/08/2023. Recommended close monitoring of glucose levels and outpatient follow-up with PCP for further management.    Hypokalemia: Improved   His condition has improved and he is deemed stable for discharge to home.          Consultants: None Procedures performed:  None Disposition: Home Diet recommendation:  Discharge Diet Orders (From admission, onward)     Start     Ordered   08/20/23 0000  Diet - low sodium heart healthy        08/20/23 0949   08/20/23 0000  Diet Carb Modified        08/20/23 0949           Cardiac and Carb modified diet DISCHARGE MEDICATION: Allergies as of 08/20/2023   No Known Allergies      Medication List     STOP taking these medications    glipiZIDE 10 MG tablet Commonly known as: GLUCOTROL   insulin lispro 100 UNIT/ML injection Commonly known as: HUMALOG   lisinopril 10 MG tablet Commonly known as: ZESTRIL   metFORMIN 500 MG 24 hr tablet Commonly known as: GLUCOPHAGE-XR   Tradjenta 5 MG Tabs tablet Generic drug: linagliptin       TAKE these medications    aspirin EC 81 MG tablet Take 1 tablet by mouth daily.   dapagliflozin propanediol 10 MG Tabs tablet Commonly known as: FARXIGA Take 1 tablet by mouth daily.   furosemide 20 MG tablet Commonly known as: LASIX Take 3 tablets (60 mg total) by mouth daily. Start taking on: August 21, 2023   hydrALAZINE 50 MG tablet Commonly known as: APRESOLINE Take 1 tablet (50 mg total) by mouth 3 (three) times daily as needed (systolic BP >150).   insulin NPH-regular Human (70-30) 100 UNIT/ML injection Inject 15 Units into  the skin 2 (two) times daily with a meal. What changed: how much to take   isosorbide mononitrate 30 MG 24 hr tablet Commonly known as: IMDUR Take 1 tablet (30 mg total) by mouth daily.   metoprolol succinate 100 MG 24 hr tablet Commonly known as: TOPROL-XL Take 1 tablet (100 mg total) by mouth daily. Take with or immediately following a meal.   rosuvastatin 5 MG tablet Commonly known as: CRESTOR Take 1 tablet (5 mg total) by mouth daily.   sacubitril-valsartan 49-51 MG Commonly known as: ENTRESTO Take 1 tablet by mouth 2 (two) times daily.   spironolactone 25 MG tablet Commonly known as: ALDACTONE Take 1  tablet (25 mg total) by mouth daily.   tamsulosin 0.4 MG Caps capsule Commonly known as: FLOMAX Take 1 capsule (0.4 mg total) by mouth daily.   UltiCare Insulin Syringe 31G X 5/16" 0.5 ML Misc Generic drug: Insulin Syringe-Needle U-100 use twice a day with humulin 70/30        Follow-up Information     [facility]. Go on 08/23/2023.   Specialty: Cardiology Why: Hospital Follow-Up 08/23/23 @ 2:30 pm Please bring all medications to follow-up appt [facility], [address] Second Floor Free Valet Parking at the [employer] information: [address] [place] [address] [phone]               Discharge Exam: Filed Weights   08/18/23 0500 08/19/23 0500 08/20/23 0500  Weight: 83 kg 79.5 kg 79.5 kg   GEN: NAD SKIN: Warm and dry EYES: No pallor or icterus ENT: MMM CV: RRR PULM: CTA B ABD: soft, ND, NT, +BS CNS: AAO x 3, non focal EXT: No edema or tenderness   Condition at discharge: good  The results of significant diagnostics from this hospitalization (including imaging, microbiology, ancillary and laboratory) are listed below for reference.   Imaging Studies: DG Chest 1 View Result Date: 08/17/2023 CLINICAL DATA:  63 year old male with history of congestive heart failure. EXAM: CHEST  1 VIEW COMPARISON:  Chest x-ray 08/16/2023. FINDINGS: Diffuse interstitial prominence, peribronchial cuffing and patchy ill-defined opacities scattered throughout the lungs bilaterally with small bilateral pleural effusions (right greater than left). Linear opacity in the left mid lung likely reflective of subsegmental atelectasis. Cephalization of the pulmonary vasculature. Mild cardiomegaly. Upper mediastinal contours are within normal limits. IMPRESSION: 1. The appearance the chest once again suggests congestive heart failure, as above. Electronically Signed   By: Trudie Reed M.D.   On:  08/17/2023 07:46   DG Chest Port 1 View Result Date: 08/16/2023 CLINICAL DATA:  63 year old male with history of shortness of breath. EXAM: PORTABLE CHEST 1 VIEW COMPARISON:  Chest x-ray 03/12/2023. FINDINGS: There is cephalization of the pulmonary vasculature, indistinctness of the interstitial markings, and patchy airspace disease throughout the lungs bilaterally suggestive of moderate pulmonary edema. Small bilateral pleural effusions. Mild cardiomegaly. Upper mediastinal contours are within normal limits. IMPRESSION: 1. The appearance of the chest is most compatible with moderate congestive heart failure, as above. Electronically Signed   By: Trudie Reed M.D.   On: 08/16/2023 05:55    Microbiology: Results for orders placed or performed during the hospital encounter of 03/12/23  SARS Coronavirus 2 by RT PCR (hospital order, performed in [facility] hospital lab) *cepheid single result test* Anterior Nasal Swab     Status: None   Collection Time: 03/12/23  3:43 PM   Specimen: Anterior Nasal Swab  Result Value Ref Range Status   SARS Coronavirus 2 by RT PCR NEGATIVE NEGATIVE Final    Comment: (NOTE) SARS-CoV-2 target nucleic acids are NOT DETECTED.  The SARS-CoV-2 RNA is generally detectable in upper and lower respiratory specimens during the acute phase of infection. The lowest concentration of SARS-CoV-2 viral copies this assay can detect is 250 copies / mL. A negative result does not preclude SARS-CoV-2 infection and should not be used as the sole basis for treatment or other patient management decisions.  A negative result may occur with improper specimen collection / handling, submission of specimen other than nasopharyngeal swab, presence of viral mutation(s) within the areas targeted by this assay, and inadequate number of viral copies (<250 copies / mL). A negative result must be combined with clinical observations, patient history, and epidemiological information.  Fact  Sheet for Patients:   RoadLapTop.co.za  Fact Sheet for Healthcare Providers: http://kim-miller.com/  This test is not yet approved or  cleared by the [place] FDA and has been authorized for detection and/or diagnosis of SARS-CoV-2 by FDA under an Emergency Use Authorization (EUA).  This EUA will remain in effect (meaning this test can be used) for the duration of the COVID-19 declaration under Section 564(b)(1) of the Act, 21 U.S.C. section 360bbb-3(b)(1), unless the authorization is terminated or revoked sooner.  Performed at [facility], [address] [place], [place] [address]     Labs: CBC: Recent Labs  Lab 08/16/23 0340  WBC 8.0  NEUTROABS 5.4  HGB 10.7*  HCT 31.2*  MCV 92.0  PLT 246   Basic Metabolic Panel: Recent Labs  Lab 08/16/23 0340 08/17/23 0414 08/18/23 0345 08/19/23 0515 08/20/23 0551  NA 141 141 139 140 139  K 4.1 3.1* 3.1* 3.1* 4.2  CL 106 105 97* 100 104  CO2 28 27 30 30 28   GLUCOSE 227* 80 50* 40* 153*  BUN 21 25* 31* 30* 28*  CREATININE 1.64* 1.58* 2.06* 2.08* 1.71*  CALCIUM 8.6* 7.8* 8.8* 8.8* 8.6*  MG  --   --  1.8  --  2.2  PHOS  --   --  3.8  --   --    Liver Function Tests: Recent Labs  Lab 08/16/23 0340  AST 25  ALT 20  ALKPHOS 254*  BILITOT 1.2  PROT 7.3  ALBUMIN 3.2*   CBG: Recent Labs  Lab 08/19/23 0850 08/19/23 1210 08/19/23 1651 08/19/23 2202 08/20/23 0822  GLUCAP 119* 118* 180* 202* 157*    Discharge time spent: greater than 30 minutes.  Signed: Lurene Shadow, MD [facility] 08/20/2023

## 2023-08-20 NOTE — Telephone Encounter (Signed)
Patient Product/process development scientist completed.    The patient is insured through [employer] and [employer].     Ran test claim for Farxiga 10 mg and the current 30 day co-pay is $4.00.   This test claim was processed through [facility]- copay amounts may vary at other pharmacies due to pharmacy/plan contracts, or as the patient moves through the different stages of their insurance plan.     Chad Good, CPHT Pharmacy Technician III Certified Patient Advocate [facility] Patient Advocate Team Direct Number: [phone]  Fax: [phone]

## 2023-08-20 NOTE — Progress Notes (Signed)
Advanced Heart Failure Clinic Note   Referring Physician: recent admission PCP: [facility] (last seen 12/24) Cardiologist: [facility] (last seen 12/24)  Chief Complaint: fatigue  HPI:  Chad Good is a 63 y/o male with a history of refractory HTN, HLD, IDDM with insulin resistance, PAD, CKD stage III, tobacco use and chronic HFpEF. Previously followed with Dr. Juliann Pares with [facility], but recently transferred cardiac care to [facility], which may account for lapse in medication fills. Reports being diagnosed with CHF initially after receiving the COVID vaccination in 2021.   Pertinent cardiac history: Stress test in 08/2020 noted LVEF of 40-45% and inferior wall hypokinesis associated with moderate perfusion abnormality. Cardiac MRI in 10/2022 noted diffuse patchy hyperenhancement of the basal septal and lateral walls, a pattern of scarring non-ischemic in nature and seen more in patients with history of uncontrolled hypertension, though HCM could not be excluded. The CMR findings probability for cardiac amyloidosis was low. TTE 03/13/23 showed LVEF of 50-55% with GIIIDD.  Admitted 08/16/23 due to worsening shortness of breath and pedal edema after running out of lasix for ~ 1 week. Placed on oxygen due to RA sat of 87%. CXR showed pulmonary congestion. IV diuresed. Renal function worsened so diuretics held and IVF given.   He presents today for his initial HF visit with a chief complaint of minimal fatigue. Chronic in nature and says that his energy level is much improved. Denies shortness of breath, chest pain, cough, palpitations, abdominal distention, pedal edema, dizziness or weight gain. Reports sleeping well on 2 pillows.   Did not take medications yet today and it's almost 3pm. 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 Does intend to get both doses of  medications in today. Hydralazine is listed as TID PRN for SBP >150 but he says that he's been taking it once daily and that his SBP doesn't get below 140.   Does not add salt to his food. Weighing daily. Drinks 20 oz Mtn Dew, 32 oz water, 20 oz gatorade daily. Understands that he needs to keep his daily fluid intake to 60-64 ounces and little gatorade due to sodium.    Review of Systems: [y] = yes, [ ]  = no   General: Weight gain [ ] ; Weight loss [ ] ; Anorexia [ ] ; Fatigue Cove.Etienne ]; Fever [ ] ; Chills [ ] ; Weakness [ ]   Cardiac: Chest pain/pressure [ ] ; Resting SOB [ ] ; Exertional SOB [ ] ; Orthopnea [ ] ; Pedal Edema [ ] ; Palpitations [ ] ; Syncope [ ] ; Presyncope [ ] ; Paroxysmal nocturnal dyspnea[ ]   Pulmonary: Cough [ ] ; Wheezing[ ] ; Hemoptysis[ ] ; Sputum [ ] ; Snoring [ ]   GI: Vomiting[ ] ; Dysphagia[ ] ; Melena[ ] ; Hematochezia [ ] ; Heartburn[ ] ; Abdominal pain [ ] ; Constipation [ ] ; Diarrhea [ ] ; BRBPR [ ]   GU: Hematuria[ ] ; Dysuria [ ] ; Nocturia[ ]   Vascular: Pain in legs with walking [ ] ; Pain in feet with lying flat [ ] ; Non-healing sores [ ] ; Stroke [ ] ; TIA [ ] ; Slurred speech [ ] ;  Neuro: Headaches[ ] ; Vertigo[ ] ; Seizures[ ] ; Paresthesias[ ] ;Blurred vision [ ] ; Diplopia [ ] ; Vision changes [ ]   Ortho/Skin: Arthritis [ ] ; Joint pain [ ] ; Muscle pain [ ] ; Joint swelling [ ] ; Back Pain [ ] ; Rash [ ]   Psych: Depression[ ] ; Anxiety[ ]   Heme:  Bleeding problems [ ] ; Clotting disorders [ ] ; Anemia [ ]   Endocrine: Diabetes Cove.Etienne ]; Thyroid dysfunction[ ]    Past Medical History:  Diagnosis Date   Diabetes mellitus without complication (HCC)    High cholesterol    Hypertension    Stomach ulcer     Current Outpatient Medications  Medication Sig Dispense Refill   aspirin EC 81 MG tablet Take 1 tablet by mouth daily.     dapagliflozin propanediol (FARXIGA) 10 MG TABS tablet Take 1 tablet by mouth daily.     [START ON 08/21/2023] furosemide (LASIX) 20 MG tablet Take 3 tablets (60 mg total) by mouth  daily. 90 tablet 0   hydrALAZINE (APRESOLINE) 50 MG tablet Take 1 tablet (50 mg total) by mouth 3 (three) times daily as needed (systolic BP >150). 90 tablet 0   insulin NPH-regular Human (70-30) 100 UNIT/ML injection Inject 15 Units into the skin 2 (two) times daily with a meal. 10 mL 0   Insulin Syringe-Needle U-100 (ULTICARE INSULIN SYRINGE) 31G X 5/16" 0.5 ML MISC use twice a day with humulin 70/30 60 each 0   isosorbide mononitrate (IMDUR) 30 MG 24 hr tablet Take 1 tablet (30 mg total) by mouth daily. 30 tablet 0   metoprolol succinate (TOPROL-XL) 100 MG 24 hr tablet Take 1 tablet (100 mg total) by mouth daily. Take with or immediately following a meal. 30 tablet 0   rosuvastatin (CRESTOR) 5 MG tablet Take 1 tablet (5 mg total) by mouth daily. 30 tablet 0   sacubitril-valsartan (ENTRESTO) 49-51 MG Take 1 tablet by mouth 2 (two) times daily. 60 tablet 0   spironolactone (ALDACTONE) 25 MG tablet Take 1 tablet (25 mg total) by mouth daily. 30 tablet 0   tamsulosin (FLOMAX) 0.4 MG CAPS capsule Take 1 capsule (0.4 mg total) by mouth daily. 30 capsule 0   No current facility-administered medications for this visit.    No Known Allergies    Social History   Socioeconomic History   Marital status: Single    Spouse name: Not on file   Number of children: 7   Years of education: Not on file   Highest education level: 12th grade  Occupational History   Occupation: group home  Tobacco Use   Smoking status: Every Day    Types: Cigars    Start date: 07/07/1995   Smokeless tobacco: Never   Tobacco comments:    2-3 cigars/day  Vaping Use   Vaping status: Never Used  Substance and Sexual Activity   Alcohol use: No   Drug use: No   Sexual activity: Yes  Other Topics Concern   Not on file  Social History Narrative   Not on file   Social Drivers of Health   Financial Resource Strain: Low Risk  (08/18/2023)   Overall Financial Resource Strain (CARDIA)    Difficulty of Paying Living  Expenses: Not hard at all  Food Insecurity: No Food Insecurity (08/18/2023)   Hunger Vital Sign    Worried About Running Out of Food in the Last Year: Never true    Ran Out of Food in the Last Year: Never true  Transportation Needs: No Transportation Needs (08/18/2023)   PRAPARE - Administrator, Civil Service (Medical): No    Lack of Transportation (Non-Medical): No  Physical Activity: Inactive (08/18/2023)   Exercise Vital Sign    Days of Exercise per Week: 0 days    Minutes of Exercise per Session: 0 min  Stress:  Stress Concern Present (12/09/2022)   Received from [facility], [facility]   [employer] of Occupational Health - Occupational Stress Questionnaire    Feeling of Stress : To some extent  Social Connections: Unknown (08/17/2023)   Social Connection and Isolation Panel [NHANES]    Frequency of Communication with Friends and Family: More than three times a week    Frequency of Social Gatherings with Friends and Family: More than three times a week    Attends Religious Services: Never    Database administrator or Organizations: No    Attends Banker Meetings: Never    Marital Status: Patient declined  Catering manager Violence: Not At Risk (08/17/2023)   Humiliation, Afraid, Rape, and Kick questionnaire    Fear of Current or Ex-Partner: No    Emotionally Abused: No    Physically Abused: No    Sexually Abused: No      Family History  Problem Relation Age of Onset   Diabetes Father     Vitals:   08/23/23 1508 08/23/23 1509  BP: (!) 186/90 (!) 180/81  Pulse: 71   SpO2: 100%   Weight: 178 lb (80.7 kg)    Wt Readings from Last 3 Encounters:  08/23/23 178 lb (80.7 kg)  08/20/23 175 lb 4.3 oz (79.5 kg)  03/14/23 181 lb (82.1 kg)   Lab Results  Component Value Date   CREATININE 1.75 (H) 08/23/2023   CREATININE 1.71 (H) 08/20/2023   CREATININE 2.08 (H) 08/19/2023   PHYSICAL EXAM:  General: Well  appearing. No resp difficulty HEENT: normal Neck: supple, no JVD Cor: Regular rhythm, rate. No rubs, gallops or murmurs Lungs: clear Abdomen: soft, nontender, nondistended. Extremities: no cyanosis, clubbing, rash, trace pitting edema around bilateral shins Neuro: alert & oriented X 3. Moves all 4 extremities w/o difficulty. Affect pleasant   ECG: not done   ASSESSMENT & PLAN:  1: NICM with preserved ejection fraction- - suspect due to uncontrolled HTN - NYHA class II - euvolemic - weighing daily; reviewed the importance of calling for overnight weight gain of > 2 pounds or weekly weight gain of > 5 pounds - Cardiac MRI in 10/2022 noted diffuse patchy hyperenhancement of the basal septal and lateral walls, a pattern of scarring non-ischemic in nature and seen more in patients with history of uncontrolled hypertension, though HCM could not be excluded. The CMR findings probability for cardiac amyloidosis was low. - TTE 03/13/23 showed LVEF of 50-55% with GIII DD. - daughter also has HF so may benefit from genetic testing - continue farxiga 10mg  daily - continue furosemide 60mg  daily - continue metoprolol succinate 100mg  daily - continue entresto 49/51mg  BID - continue spironolactone 25mg  daily - stop isosorbide as nitrates can worsen functional capacity in HFpEF & he doesn't have any chest pain - BMET today - BNP 08/16/23 reviewed and was 1149.0  2: HTN- - BP 180/81 but he hasn't taken any of his medications yet today - emphasized taking them at consistent times and that he doesn't need to eat a full meal when taking the medications - saw [facility] PCP 12/24 - increase hydralazine to 50mg  TID and not PRN; he has been taking it once daily - BP log given and instructed to bring to each visit - BMET 08/20/23 reviewed and showed sodium 139, potassium 4.2, creatinine 1.71 & GFR 45 - BMET today  3: IDDM- - A1c 08/18/23 reviewed and was 6.3%  4: Hyperlipidemia- - LDL 07/17/20 reviewed  and  was 47 - saw [facility] cardiology 12/24 - continue rosuvastatin 5mg  daily - lipid panel today  5: Tobacco use-  - has not smoked since discharge 3 days ago - continued cessation discussed   Return in 1 month, sooner if needed.   Delma Freeze, FNP 08/20/23

## 2023-08-20 NOTE — Telephone Encounter (Signed)
Pt confirmed appt for 08/23/23

## 2023-08-20 NOTE — Progress Notes (Signed)
Heart Failure Stewardship Pharmacy Note  PCP: Pcp, No PCP-Cardiologist: None  HPI: Chad Vanderveer Sr. is a 63 y.o. male with HTN, HLD, IDDM with insulin resistance, chronic HFpEF, PAD, CKD stage III who presented with worsening shortness of breath and lower extremity edema. Patient reports taking medications as prescribed, but based on fill history appears to be nonadherent to several of his prescribed medications. Previously followed with Dr. Juliann Pares with [facility], but recently transferred cardiac care to [facility], which may account for lapse in medication fills. Reports being diagnosed with CHF initially after receiving the COVID vaccination in 2021. On admission, BNP was 1149, HS-troponin was 36 > 37. Chest x-ray noted appearance compatible with moderate congestive heart failure.   Pertinent cardiac history: Stress test in 08/2020 noted LVEF of 40-45% and inferior wall hypokinesis associated with moderate perfusion abnormality. Cardiac MRI in 10/2022 noted diffuse patchy hyperenhancement of the basal septal and lateral walls, a pattern of scarring non-ischemic in nature and seen more in patients with history of uncontrolled hypertension, though HCM could not be excluded. The CMR findings probability for cardiac amyloidosis was low. TTE 03/13/23 showed LVEF of 50-55% with GIIIDD.  Pertinent Lab Values: Creatinine  Date Value Ref Range Status  05/08/2014 0.98 0.60 - 1.30 mg/dL Final   Creatinine, Ser  Date Value Ref Range Status  08/20/2023 1.71 (H) 0.61 - 1.24 mg/dL Final   BUN  Date Value Ref Range Status  08/20/2023 28 (H) 8 - 23 mg/dL Final  19/14/7829 10 7 - 18 mg/dL Final   Potassium  Date Value Ref Range Status  08/20/2023 4.2 3.5 - 5.1 mmol/L Final  05/08/2014 3.5 3.5 - 5.1 mmol/L Final   Sodium  Date Value Ref Range Status  08/20/2023 139 135 - 145 mmol/L Final  05/08/2014 138 136 - 145 mmol/L Final   B Natriuretic Peptide  Date Value Ref Range Status  08/16/2023  1,149.0 (H) 0.0 - 100.0 pg/mL Final    Comment:    Performed at [facility], [address] [place], [place] [address]   Magnesium  Date Value Ref Range Status  08/20/2023 2.2 1.7 - 2.4 mg/dL Final    Comment:    Performed at [facility], [address] [place], [place] [address]   Hgb A1c MFr Bld  Date Value Ref Range Status  08/18/2023 6.3 (H) 4.8 - 5.6 % Final    Comment:    (NOTE) Pre diabetes:          5.7%-6.4%  Diabetes:              >6.4%  Glycemic control for   <7.0% adults with diabetes     Vital Signs:  Temp:  [97.6 F (36.4 C)-98.4 F (36.9 C)] 98 F (36.7 C) (02/14 0606) Pulse Rate:  [66-71] 71 (02/14 0606) Cardiac Rhythm: Normal sinus rhythm (02/14 0700) Resp:  [15-20] 15 (02/14 0606) BP: (134-181)/(75-102) 143/75 (02/14 0606) SpO2:  [93 %-100 %] 93 % (02/14 0606) Weight:  [79.5 kg (175 lb 4.3 oz)] 79.5 kg (175 lb 4.3 oz) (02/14 0500)  Intake/Output Summary (Last 24 hours) at 08/20/2023 0749 Last data filed at 08/20/2023 0600 Gross per 24 hour  Intake 957 ml  Output --  Net 957 ml    Current Heart Failure Medications:  Loop diuretic: none Beta-Blocker: metoprolol succinate 100 mg daily ACEI/ARB/ARNI: Entresto 49/51 mg BID MRA: spironolactone 25 mg daily SGLT2i: Farxiga 10 mg daily  Prior to admission Heart Failure Medications:  Loop  diuretic: furosemide 60 mg daily (No fills since 03/2023) Beta-Blocker: metoprolol succinate 100 mg daily (No fills since 03/2023) ACEI/ARB/ARNI: Entresto 24-26 mg BID (No fills since 03/2023) MRA: Spironolactone 25 mg daily (No fills since 03/2023) SGLT2i: Farxiga 10 mg daily  Assessment: 1. 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 NYHA class III symptoms.  -Symptoms: Reports shortness of breath and LEE are now back to baseline. Appetite is good. Orthopnea is improved. Denies  dizziness and lightheadedness. -Volume: Appears to be euvolemic today after holding diuretics and giving back fluid. Creatinine and BUN are trending down. Would consider resuming GDMT tomorrow and diuretics on Sunday. Patient was not taking furosemide 60 mg consistently prior to admission. -Hemodynamics: BP remains significantly elevated. HR is 60-70s. -BB: Metoprolol succinate 100 mg daily. HR is in 60-70s.  -ACEI/ARB/ARNI: BP remained elevated with Entresto on hold. Can consider resuming tomorrow. -MRA: Spironolactone 25 mg daily held with AKI. Can consider resuming tomorrow. -SGLT2i: Farxiga 10 mg daily  -Nitrates can worsen functional capacity in HFpEF. Given no chest pain, can consider stopping.  Plan: 1) Medication changes recommended at this time: -Continue hydralazine every 8 hours today, consider resuming Entresto and spironolactone tomorrow -Consider restarting furosemide at 40 mg daily on Sunday. -Recommend filling medications today for meds to beds if a weekend discharge is anticipated.   2) Patient assistance: -Sherryll Burger is $29, [place] and [place] require prior authorization  3) Education: - Patient has been educated on current HF medications and potential additions to HF medication regimen - Patient verbalizes understanding that over the next few months, these medication doses may change and more medications may be added to optimize HF regimen - Patient has been educated on basic disease state pathophysiology and goals of therapy  Medication Assistance / Insurance Benefits Check: Does the patient have prescription insurance?    Type of insurance plan:  Does the patient qualify for medication assistance through manufacturers or grants? Pending  Outpatient Pharmacy: Prior to admission outpatient pharmacy: [facility]   Is the patient willing to utilize a [facility] pharmacy at discharge?: Yes  Please do not hesitate to reach out with questions or concerns,  Enos Fling,  PharmD, CPP, BCPS Heart Failure Pharmacist  Phone - [phone] 08/20/2023 7:49 AM

## 2023-08-20 NOTE — Plan of Care (Signed)

## 2023-08-23 ENCOUNTER — Ambulatory Visit (HOSPITAL_BASED_OUTPATIENT_CLINIC_OR_DEPARTMENT_OTHER): Payer: Medicaid Other | Admitting: Family

## 2023-08-23 ENCOUNTER — Encounter: Payer: Self-pay | Admitting: Family

## 2023-08-23 ENCOUNTER — Other Ambulatory Visit
Admission: RE | Admit: 2023-08-23 | Discharge: 2023-08-23 | Disposition: A | Discharge status: Home / Self Care | Payer: Medicaid Other | Source: Ambulatory Visit | Attending: Family | Admitting: Family

## 2023-08-23 VITALS — BP 180/81 | HR 71 | Wt 178.0 lb

## 2023-08-23 DIAGNOSIS — E785 Hyperlipidemia, unspecified: Secondary | ICD-10-CM | POA: Diagnosis not present

## 2023-08-23 DIAGNOSIS — I5032 Chronic diastolic (congestive) heart failure: Secondary | ICD-10-CM

## 2023-08-23 DIAGNOSIS — I5033 Acute on chronic diastolic (congestive) heart failure: Secondary | ICD-10-CM | POA: Diagnosis present

## 2023-08-23 DIAGNOSIS — F17200 Nicotine dependence, unspecified, uncomplicated: Secondary | ICD-10-CM

## 2023-08-23 DIAGNOSIS — N1831 Chronic kidney disease, stage 3a: Secondary | ICD-10-CM

## 2023-08-23 DIAGNOSIS — I10 Essential (primary) hypertension: Secondary | ICD-10-CM

## 2023-08-23 DIAGNOSIS — E1122 Type 2 diabetes mellitus with diabetic chronic kidney disease: Secondary | ICD-10-CM | POA: Diagnosis not present

## 2023-08-23 LAB — LIPID PANEL
Cholesterol: 207 mg/dL — ABNORMAL HIGH (ref 0–200)
HDL: 36 mg/dL — ABNORMAL LOW (ref >40)
LDL Cholesterol: 140 mg/dL — ABNORMAL HIGH (ref 0–99)
Total CHOL/HDL Ratio: 5.8 {ratio}
Triglycerides: 155 mg/dL — ABNORMAL HIGH (ref <150)
VLDL: 31 mg/dL (ref 0–40)

## 2023-08-23 LAB — BASIC METABOLIC PANEL
Anion gap: 7 (ref 5–15)
BUN: 31 mg/dL — ABNORMAL HIGH (ref 8–23)
CO2: 27 mmol/L (ref 22–32)
Calcium: 9 mg/dL (ref 8.9–10.3)
Chloride: 106 mmol/L (ref 98–111)
Creatinine, Ser: 1.75 mg/dL — ABNORMAL HIGH (ref 0.61–1.24)
GFR, Estimated: 43 mL/min — ABNORMAL LOW (ref >60)
Glucose, Bld: 183 mg/dL — ABNORMAL HIGH (ref 70–99)
Potassium: 4.1 mmol/L (ref 3.5–5.1)
Sodium: 140 mmol/L (ref 135–145)

## 2023-08-23 NOTE — Patient Instructions (Addendum)
DISCONTINUE (STOP!) IMDUR  START TAKING YOUR HYDRALAZINE TO 50 MG 3 TIMES DAILY   Go over to the [facility]. Go pass the gift shop and have your blood work completed.  We will only call you if the results are abnormal or if the provider would like to make medication changes.   It was good to meet you today!

## 2023-08-24 ENCOUNTER — Other Ambulatory Visit: Payer: Self-pay

## 2023-08-24 MED ORDER — ROSUVASTATIN CALCIUM 20 MG PO TABS
20.0000 mg | ORAL_TABLET | Freq: Every day | ORAL | 3 refills | Status: DC
  Filled 2023-08-24: qty 90, 90d supply, fill #0

## 2023-09-07 ENCOUNTER — Other Ambulatory Visit: Payer: Self-pay

## 2023-09-22 ENCOUNTER — Encounter: Payer: BLUE CROSS/BLUE SHIELD | Admitting: Family

## 2023-09-22 ENCOUNTER — Telehealth: Payer: Self-pay | Admitting: Family

## 2023-09-22 NOTE — Telephone Encounter (Signed)
 Patient did not show for his Heart Failure Clinic appointment on 09/22/23.

## 2023-09-22 NOTE — Progress Notes (Deleted)
 Advanced Heart Failure Clinic Note   Referring Physician: recent admission PCP: [facility] (last seen 12/24) Cardiologist: [facility] (last seen 12/24)  Chief Complaint: fatigue  HPI:  Chad Good is a 63 y/o male with a history of refractory HTN, HLD, IDDM with insulin resistance, PAD, CKD stage III, tobacco use and chronic HFpEF. Previously followed with Dr. Juliann Pares with [facility], but recently transferred cardiac care to [facility], which may account for lapse in medication fills. Reports being diagnosed with CHF initially after receiving the COVID vaccination in 2021.   Pertinent cardiac history: Stress test in 08/2020 noted LVEF of 40-45% and inferior wall hypokinesis associated with moderate perfusion abnormality. Cardiac MRI in 10/2022 noted diffuse patchy hyperenhancement of the basal septal and lateral walls, a pattern of scarring non-ischemic in nature and seen more in patients with history of uncontrolled hypertension, though HCM could not be excluded. The CMR findings probability for cardiac amyloidosis was low. TTE 03/13/23 showed LVEF of 50-55% with GIIIDD.  Admitted 08/16/23 due to worsening shortness of breath and pedal edema after running out of lasix for ~ 1 week. Placed on oxygen due to RA sat of 87%. CXR showed pulmonary congestion. IV diuresed. Renal function worsened so diuretics held and IVF given.   He presents today for his initial HF visit with a chief complaint of minimal fatigue. Chronic in nature and says that his energy level is much improved. Denies shortness of breath, chest pain, cough, palpitations, abdominal distention, pedal edema, dizziness or weight gain. Reports sleeping well on 2 pillows.   Did not take medications yet today and it's almost 3pm. 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 Does intend to get both doses of  medications in today. Hydralazine is listed as TID PRN for SBP >150 but he says that he's been taking it once daily and that his SBP doesn't get below 140.   Does not add salt to his food. Weighing daily. Drinks 20 oz Mtn Dew, 32 oz water, 20 oz gatorade daily. Understands that he needs to keep his daily fluid intake to 60-64 ounces and little gatorade due to sodium.    Review of Systems: [y] = yes, [ ]  = no   General: Weight gain [ ] ; Weight loss [ ] ; Anorexia [ ] ; Fatigue Cove.Etienne ]; Fever [ ] ; Chills [ ] ; Weakness [ ]   Cardiac: Chest pain/pressure [ ] ; Resting SOB [ ] ; Exertional SOB [ ] ; Orthopnea [ ] ; Pedal Edema [ ] ; Palpitations [ ] ; Syncope [ ] ; Presyncope [ ] ; Paroxysmal nocturnal dyspnea[ ]   Pulmonary: Cough [ ] ; Wheezing[ ] ; Hemoptysis[ ] ; Sputum [ ] ; Snoring [ ]   GI: Vomiting[ ] ; Dysphagia[ ] ; Melena[ ] ; Hematochezia [ ] ; Heartburn[ ] ; Abdominal pain [ ] ; Constipation [ ] ; Diarrhea [ ] ; BRBPR [ ]   GU: Hematuria[ ] ; Dysuria [ ] ; Nocturia[ ]   Vascular: Pain in legs with walking [ ] ; Pain in feet with lying flat [ ] ; Non-healing sores [ ] ; Stroke [ ] ; TIA [ ] ; Slurred speech [ ] ;  Neuro: Headaches[ ] ; Vertigo[ ] ; Seizures[ ] ; Paresthesias[ ] ;Blurred vision [ ] ; Diplopia [ ] ; Vision changes [ ]   Ortho/Skin: Arthritis [ ] ; Joint pain [ ] ; Muscle pain [ ] ; Joint swelling [ ] ; Back Pain [ ] ; Rash [ ]   Psych: Depression[ ] ; Anxiety[ ]   Heme:  Bleeding problems [ ] ; Clotting disorders [ ] ; Anemia [ ]   Endocrine: Diabetes Cove.Etienne ]; Thyroid dysfunction[ ]    Past Medical History:  Diagnosis Date   Diabetes mellitus without complication (HCC)    High cholesterol    Hypertension    Stomach ulcer     Current Outpatient Medications  Medication Sig Dispense Refill   aspirin EC 81 MG tablet Take 1 tablet by mouth daily.     dapagliflozin propanediol (FARXIGA) 10 MG TABS tablet Take 1 tablet by mouth daily.     furosemide (LASIX) 20 MG tablet Take 3 tablets (60 mg total) by mouth daily. 90 tablet 0    hydrALAZINE (APRESOLINE) 50 MG tablet Take 1 tablet (50 mg total) by mouth 3 (three) times daily as needed (systolic BP >150). (Patient taking differently: Take 50 mg by mouth 3 (three) times daily.) 90 tablet 0   insulin NPH-regular Human (70-30) 100 UNIT/ML injection Inject 15 Units into the skin 2 (two) times daily with a meal. 10 mL 0   Insulin Syringe-Needle U-100 (ULTICARE INSULIN SYRINGE) 31G X 5/16" 0.5 ML MISC use twice a day with humulin 70/30 (Patient not taking: Reported on 08/23/2023) 60 each 0   metoprolol succinate (TOPROL-XL) 100 MG 24 hr tablet Take 1 tablet (100 mg total) by mouth daily. Take with or immediately following a meal. 30 tablet 0   rosuvastatin (CRESTOR) 20 MG tablet Take 1 tablet (20 mg total) by mouth daily. 90 tablet 3   sacubitril-valsartan (ENTRESTO) 49-51 MG Take 1 tablet by mouth 2 (two) times daily. 60 tablet 0   spironolactone (ALDACTONE) 25 MG tablet Take 1 tablet (25 mg total) by mouth daily. 30 tablet 0   tamsulosin (FLOMAX) 0.4 MG CAPS capsule Take 1 capsule (0.4 mg total) by mouth daily. 30 capsule 0   No current facility-administered medications for this visit.    No Known Allergies    Social History   Socioeconomic History   Marital status: Single    Spouse name: Not on file   Number of children: 7   Years of education: Not on file   Highest education level: 12th grade  Occupational History   Occupation: group home  Tobacco Use   Smoking status: Every Day    Types: Cigars    Start date: 07/07/1995   Smokeless tobacco: Never   Tobacco comments:    2-3 cigars/day  Vaping Use   Vaping status: Never Used  Substance and Sexual Activity   Alcohol use: No   Drug use: No   Sexual activity: Yes  Other Topics Concern   Not on file  Social History Narrative   Not on file   Social Drivers of Health   Financial Resource Strain: Low Risk  (08/18/2023)   Overall Financial Resource Strain (CARDIA)    Difficulty of Paying Living Expenses: Not  hard at all  Food Insecurity: No Food Insecurity (08/18/2023)   Hunger Vital Sign    Worried About Running Out of Food in the Last Year: Never true    Ran Out of Food in the Last Year: Never true  Transportation Needs: No Transportation Needs (08/18/2023)   PRAPARE - Administrator, Civil Service (Medical): No    Lack of Transportation (Non-Medical): No  Physical Activity: Inactive (08/18/2023)   Exercise Vital Sign    Days of Exercise per Week: 0 days    Minutes of Exercise per Session: 0 min  Stress: Stress Concern Present (12/09/2022)   Received  from [facility], [facility]   [employer] of Occupational Health - Occupational Stress Questionnaire    Feeling of Stress : To some extent  Social Connections: Unknown (08/17/2023)   Social Connection and Isolation Panel [NHANES]    Frequency of Communication with Friends and Family: More than three times a week    Frequency of Social Gatherings with Friends and Family: More than three times a week    Attends Religious Services: Never    Database administrator or Organizations: No    Attends Banker Meetings: Never    Marital Status: Patient declined  Catering manager Violence: Not At Risk (08/17/2023)   Humiliation, Afraid, Rape, and Kick questionnaire    Fear of Current or Ex-Partner: No    Emotionally Abused: No    Physically Abused: No    Sexually Abused: No      Family History  Problem Relation Age of Onset   Diabetes Father     There were no vitals filed for this visit.  Wt Readings from Last 3 Encounters:  08/23/23 178 lb (80.7 kg)  08/20/23 175 lb 4.3 oz (79.5 kg)  03/14/23 181 lb (82.1 kg)   Lab Results  Component Value Date   CREATININE 1.75 (H) 08/23/2023   CREATININE 1.71 (H) 08/20/2023   CREATININE 2.08 (H) 08/19/2023   PHYSICAL EXAM:  General: Well appearing. No resp difficulty HEENT: normal Neck: supple, no JVD Cor: Regular rhythm, rate. No  rubs, gallops or murmurs Lungs: clear Abdomen: soft, nontender, nondistended. Extremities: no cyanosis, clubbing, rash, trace pitting edema around bilateral shins Neuro: alert & oriented X 3. Moves all 4 extremities w/o difficulty. Affect pleasant   ECG: not done   ASSESSMENT & PLAN:  1: NICM with preserved ejection fraction- - suspect due to uncontrolled HTN - NYHA class II - euvolemic - weighing daily; reviewed the importance of calling for overnight weight gain of > 2 pounds or weekly weight gain of > 5 pounds - Cardiac MRI in 10/2022 noted diffuse patchy hyperenhancement of the basal septal and lateral walls, a pattern of scarring non-ischemic in nature and seen more in patients with history of uncontrolled hypertension, though HCM could not be excluded. The CMR findings probability for cardiac amyloidosis was low. - TTE 03/13/23 showed LVEF of 50-55% with GIII DD. - daughter also has HF so may benefit from genetic testing - continue farxiga 10mg  daily - continue furosemide 60mg  daily - continue metoprolol succinate 100mg  daily - continue entresto 49/51mg  BID - continue spironolactone 25mg  daily - stop isosorbide as nitrates can worsen functional capacity in HFpEF & he doesn't have any chest pain - BMET today - BNP 08/16/23 reviewed and was 1149.0  2: HTN- - BP 180/81 but he hasn't taken any of his medications yet today - emphasized taking them at consistent times and that he doesn't need to eat a full meal when taking the medications - saw [facility] PCP 12/24 - increase hydralazine to 50mg  TID and not PRN; he has been taking it once daily - BP log given and instructed to bring to each visit - BMET 08/20/23 reviewed and showed sodium 139, potassium 4.2, creatinine 1.71 & GFR 45 - BMET today  3: IDDM- - A1c 08/18/23 reviewed and was 6.3%  4: Hyperlipidemia- - LDL 07/17/20 reviewed and was 56 - saw [facility] cardiology 12/24 - continue rosuvastatin 5mg  daily - lipid panel today  5:  Tobacco use-  - has not smoked since  discharge 3 days ago - continued cessation discussed   Return in 1 month, sooner if needed.   Delma Freeze, FNP 09/22/23

## 2023-10-06 ENCOUNTER — Telehealth: Payer: Self-pay | Admitting: Family

## 2023-10-06 NOTE — Telephone Encounter (Signed)
 Called to r/s no show appt 09/22/23

## 2023-10-27 ENCOUNTER — Telehealth: Payer: Self-pay | Admitting: Family

## 2023-10-27 NOTE — Telephone Encounter (Signed)
 Called to confirm/remind patient of their appointment at the Advanced [facility] Failure Clinic on 10/28/23.   Appointment:   [] Confirmed  [x] Left mess   [] No answer/No voice mail  [] VM Full/unable to leave message  [] Phone not in service  Patient reminded to bring all medications and/or complete list.  Confirmed patient has transportation. Gave directions, instructed to utilize valet parking.

## 2023-10-27 NOTE — Progress Notes (Deleted)
 Advanced Heart Failure Clinic Note   Referring Physician: recent admission PCP: [facility] (last seen 12/24) Cardiologist: [facility] (last seen 12/24)  Chief Complaint: fatigue  HPI:  Chad Good is a 63 y/o male with a history of refractory HTN, HLD, IDDM with insulin  resistance, PAD, CKD stage III, tobacco use and chronic HFpEF. Previously followed with Dr. Beau [facility], but recently transferred cardiac care to [facility], which may account for lapse in medication fills. Reports being diagnosed with CHF initially after receiving the COVID vaccination in 2021.   Pertinent cardiac history: Stress test in 08/2020 noted LVEF of 40-45% and inferior wall hypokinesis associated with moderate perfusion abnormality. Cardiac MRI in 10/2022 noted diffuse patchy hyperenhancement of the basal septal and lateral walls, a pattern of scarring non-ischemic in nature and seen more in patients with history of uncontrolled hypertension, though HCM could not be excluded. The CMR findings probability for cardiac amyloidosis was low. TTE 03/13/23 showed LVEF of 50-55% with GIIIDD.  Admitted 08/16/23 due to worsening shortness of breath and pedal edema after running out of lasix  for ~ 1 week. Placed on oxygen due to RA sat of 87%. CXR showed pulmonary congestion. IV diuresed. Renal function worsened so diuretics held and IVF given.   He presents today for his initial HF visit with a chief complaint of minimal fatigue. Chronic in nature and says that his energy level is much improved. Denies shortness of breath, chest pain, cough, palpitations, abdominal distention, pedal edema, dizziness or weight gain. Reports sleeping well on 2 pillows.   Did not take medications yet today and it's almost 3pm. 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 Does intend to get both doses of  medications in today. Hydralazine  is listed as TID PRN for SBP >150 but he says that he's been taking it once daily and that his SBP doesn't get below 140.   Does not add salt to his food. Weighing daily. Drinks 20 oz Mtn Dew, 32 oz water, 20 oz gatorade daily. Understands that he needs to keep his daily fluid intake to 60-64 ounces and little gatorade due to sodium.    Review of Systems: [y] = yes, [ ]  = no   General: Weight gain [ ] ; Weight loss [ ] ; Anorexia [ ] ; Fatigue Chad Good ]; Fever [ ] ; Chills [ ] ; Weakness [ ]   Cardiac: Chest pain/pressure [ ] ; Resting SOB [ ] ; Exertional SOB [ ] ; Orthopnea [ ] ; Pedal Edema [ ] ; Palpitations [ ] ; Syncope [ ] ; Presyncope [ ] ; Paroxysmal nocturnal dyspnea[ ]   Pulmonary: Cough [ ] ; Wheezing[ ] ; Hemoptysis[ ] ; Sputum [ ] ; Snoring [ ]   GI: Vomiting[ ] ; Dysphagia[ ] ; Melena[ ] ; Hematochezia [ ] ; Heartburn[ ] ; Abdominal pain [ ] ; Constipation [ ] ; Diarrhea [ ] ; BRBPR [ ]   GU: Hematuria[ ] ; Dysuria [ ] ; Nocturia[ ]   Vascular: Pain in legs with walking [ ] ; Pain in feet with lying flat [ ] ; Non-healing sores [ ] ; Stroke [ ] ; TIA [ ] ; Slurred speech [ ] ;  Neuro: Headaches[ ] ; Vertigo[ ] ; Seizures[ ] ; Paresthesias[ ] ;Blurred vision [ ] ; Diplopia [ ] ; Vision changes [ ]   Ortho/Skin: Arthritis [ ] ; Joint pain [ ] ; Muscle pain [ ] ; Joint swelling [ ] ; Back Pain [ ] ; Rash [ ]   Psych: Depression[ ] ; Anxiety[ ]   Heme:  Bleeding problems [ ] ; Clotting disorders [ ] ; Anemia [ ]   Endocrine: Diabetes Chad Good ]; Thyroid dysfunction[ ]    Past Medical History:  Diagnosis Date   Diabetes mellitus without complication (HCC)    High cholesterol    Hypertension    Stomach ulcer     Current Outpatient Medications  Medication Sig Dispense Refill   aspirin  EC 81 MG tablet Take 1 tablet by mouth daily.     dapagliflozin  propanediol (FARXIGA ) 10 MG TABS tablet Take 1 tablet by mouth daily.     furosemide  (LASIX ) 20 MG tablet Take 3 tablets (60 mg total) by mouth daily. 90 tablet 0    hydrALAZINE  (APRESOLINE ) 50 MG tablet Take 1 tablet (50 mg total) by mouth 3 (three) times daily as needed (systolic BP >150). (Patient taking differently: Take 50 mg by mouth 3 (three) times daily.) 90 tablet 0   insulin  NPH-regular Human (70-30) 100 UNIT/ML injection Inject 15 Units into the skin 2 (two) times daily with a meal. 10 mL 0   Insulin  Syringe-Needle U-100 (ULTICARE INSULIN  SYRINGE) 31G X 5/16" 0.5 ML MISC use twice a day with humulin  70/30 (Patient not taking: Reported on 08/23/2023) 60 each 0   metoprolol  succinate (TOPROL -XL) 100 MG 24 hr tablet Take 1 tablet (100 mg total) by mouth daily. Take with or immediately following a meal. 30 tablet 0   rosuvastatin  (CRESTOR ) 20 MG tablet Take 1 tablet (20 mg total) by mouth daily. 90 tablet 3   sacubitril -valsartan  (ENTRESTO ) 49-51 MG Take 1 tablet by mouth 2 (two) times daily. 60 tablet 0   spironolactone  (ALDACTONE ) 25 MG tablet Take 1 tablet (25 mg total) by mouth daily. 30 tablet 0   tamsulosin  (FLOMAX ) 0.4 MG CAPS capsule Take 1 capsule (0.4 mg total) by mouth daily. 30 capsule 0   No current facility-administered medications for this visit.    No Known Allergies    Social History   Socioeconomic History   Marital status: Single    Spouse name: Not on file   Number of children: 7   Years of education: Not on file   Highest education level: 12th grade  Occupational History   Occupation: group home  Tobacco Use   Smoking status: Every Day    Types: Cigars    Start date: 07/07/1995   Smokeless tobacco: Never   Tobacco comments:    2-3 cigars/day  Vaping Use   Vaping status: Never Used  Substance and Sexual Activity   Alcohol use: No   Drug use: No   Sexual activity: Yes  Other Topics Concern   Not on file  Social History Narrative   Not on file   Social Drivers of Health   Financial Resource Strain: Low Risk  (08/18/2023)   Overall Financial Resource Strain (CARDIA)    Difficulty of Paying Living Expenses: Not  hard at all  Food Insecurity: No Food Insecurity (08/18/2023)   Hunger Vital Sign    Worried About Running Out of Food in the Last Year: Never true    Ran Out of Food in the Last Year: Never true  Transportation Needs: No Transportation Needs (08/18/2023)   PRAPARE - Administrator, Civil Service (Medical): No    Lack of Transportation (Non-Medical): No  Physical Activity: Inactive (08/18/2023)   Exercise Vital Sign    Days of Exercise per Week: 0 days    Minutes of Exercise per Session: 0 min  Stress: Stress Concern Present (12/09/2022)   Received  from [facility], [facility]   [employer] of Occupational Health - Occupational Stress Questionnaire    Feeling of Stress : To some extent  Social Connections: Unknown (08/17/2023)   Social Connection and Isolation Panel [NHANES]    Frequency of Communication with Friends and Family: More than three times a week    Frequency of Social Gatherings with Friends and Family: More than three times a week    Attends Religious Services: Never    Database administrator or Organizations: No    Attends Banker Meetings: Never    Marital Status: Patient declined  Catering manager Violence: Not At Risk (08/17/2023)   Humiliation, Afraid, Rape, and Kick questionnaire    Fear of Current or Ex-Partner: No    Emotionally Abused: No    Physically Abused: No    Sexually Abused: No      Family History  Problem Relation Age of Onset   Diabetes Father     There were no vitals filed for this visit.  Wt Readings from Last 3 Encounters:  08/23/23 178 lb (80.7 kg)  08/20/23 175 lb 4.3 oz (79.5 kg)  03/14/23 181 lb (82.1 kg)   Lab Results  Component Value Date   CREATININE 1.75 (H) 08/23/2023   CREATININE 1.71 (H) 08/20/2023   CREATININE 2.08 (H) 08/19/2023   PHYSICAL EXAM:  General: Well appearing. No resp difficulty HEENT: normal Neck: supple, no JVD Cor: Regular rhythm, rate. No  rubs, gallops or murmurs Lungs: clear Abdomen: soft, nontender, nondistended. Extremities: no cyanosis, clubbing, rash, trace pitting edema around bilateral shins Neuro: alert & oriented X 3. Moves all 4 extremities w/o difficulty. Affect pleasant   ECG: not done   ASSESSMENT & PLAN:  1: NICM with preserved ejection fraction- - suspect due to uncontrolled HTN - NYHA class II - euvolemic - weighing daily; reviewed the importance of calling for overnight weight gain of > 2 pounds or weekly weight gain of > 5 pounds - Cardiac MRI in 10/2022 noted diffuse patchy hyperenhancement of the basal septal and lateral walls, a pattern of scarring non-ischemic in nature and seen more in patients with history of uncontrolled hypertension, though HCM could not be excluded. The CMR findings probability for cardiac amyloidosis was low. - TTE 03/13/23 showed LVEF of 50-55% with GIII DD. - daughter also has HF so may benefit from genetic testing - continue farxiga  10mg  daily - continue furosemide  60mg  daily - continue metoprolol  succinate 100mg  daily - continue entresto  49/51mg  BID - continue spironolactone  25mg  daily - stop isosorbide  as nitrates can worsen functional capacity in HFpEF & he doesn't have any chest pain - BMET today - BNP 08/16/23 reviewed and was 1149.0  2: HTN- - BP 180/81 but he hasn't taken any of his medications yet today - emphasized taking them at consistent times and that he doesn't need to eat a full meal when taking the medications - saw [facility] PCP 12/24 - increase hydralazine  to 50mg  TID and not PRN; he has been taking it once daily - BP log given and instructed to bring to each visit - BMET 08/20/23 reviewed and showed sodium 139, potassium 4.2, creatinine 1.71 & GFR 45 - BMET today  3: IDDM- - A1c 08/18/23 reviewed and was 6.3%  4: Hyperlipidemia- - LDL 07/17/20 reviewed and was 35 - saw [facility] cardiology 12/24 - continue rosuvastatin  5mg  daily - lipid panel today  5:  Tobacco use-  - has not smoked since  discharge 3 days ago - continued cessation discussed   Return in 1 month, sooner if needed.   Charlette Console, FNP 10/27/23

## 2023-10-28 ENCOUNTER — Encounter: Admitting: Family

## 2023-10-28 ENCOUNTER — Telehealth: Payer: Self-pay | Admitting: Family

## 2023-10-28 NOTE — Telephone Encounter (Signed)
 Patient did not show for his Heart Failure Clinic appointment on 10/28/23.

## 2023-11-09 ENCOUNTER — Telehealth: Payer: Self-pay | Admitting: Family

## 2023-11-09 NOTE — Telephone Encounter (Signed)
Called to r/s missed appt

## 2023-11-24 ENCOUNTER — Encounter: Payer: Self-pay | Admitting: Emergency Medicine

## 2023-11-24 ENCOUNTER — Emergency Department

## 2023-11-24 ENCOUNTER — Other Ambulatory Visit: Payer: Self-pay

## 2023-11-24 ENCOUNTER — Emergency Department
Admission: EM | Admit: 2023-11-24 | Discharge: 2023-11-24 | Disposition: A | Discharge status: Home / Self Care | Attending: Emergency Medicine | Admitting: Emergency Medicine

## 2023-11-24 DIAGNOSIS — I10 Essential (primary) hypertension: Secondary | ICD-10-CM | POA: Insufficient documentation

## 2023-11-24 DIAGNOSIS — W450XXA Nail entering through skin, initial encounter: Secondary | ICD-10-CM | POA: Insufficient documentation

## 2023-11-24 DIAGNOSIS — S99921A Unspecified injury of right foot, initial encounter: Secondary | ICD-10-CM | POA: Diagnosis present

## 2023-11-24 DIAGNOSIS — E1142 Type 2 diabetes mellitus with diabetic polyneuropathy: Secondary | ICD-10-CM | POA: Insufficient documentation

## 2023-11-24 DIAGNOSIS — S90454A Superficial foreign body, right lesser toe(s), initial encounter: Secondary | ICD-10-CM | POA: Diagnosis not present

## 2023-11-24 DIAGNOSIS — M795 Residual foreign body in soft tissue: Secondary | ICD-10-CM

## 2023-11-24 DIAGNOSIS — Z23 Encounter for immunization: Secondary | ICD-10-CM | POA: Diagnosis not present

## 2023-11-24 MED ORDER — AMOXICILLIN-POT CLAVULANATE ER 1000-62.5 MG PO TB12: 1.0000 | ORAL_TABLET | Freq: Two times a day (BID) | ORAL | 0 refills | Status: AC

## 2023-11-24 MED ORDER — TETANUS-DIPHTH-ACELL PERTUSSIS 5-2.5-18.5 LF-MCG/0.5 IM SUSY
0.5000 mL | PREFILLED_SYRINGE | Freq: Once | INTRAMUSCULAR | Status: AC
  Administered 2023-11-24: 0.5 mL via INTRAMUSCULAR
  Filled 2023-11-24: qty 0.5

## 2023-11-24 NOTE — ED Provider Notes (Signed)
 [facility] Provider Note    Event Date/Time   First MD Initiated Contact with Patient 11/24/23 1624     (approximate)   History   Foot Injury    HPI  Chad Bianchini Sr. is a 63 y.o. male    with a past medical history of failure with reduced ejection fraction, primary hypertension, erectile dysfunction, type 2 diabetes mellitus claudication, acute respiratory failure atypical chest pain neuropathy PAD hemorrhoids gastric ulcer, who presents to the ED complaining of right foot injury with a nail. According to the patient, 5 days ago patient found nail in his shoe.  First day was tender.  Patient denies fever, chills.  Patient reports having decreased sensation on his foot.  Patient cannot remember last Tdap.      Physical Exam   Triage Vital Signs: ED Triage Vitals  Encounter Vitals Group     BP 11/24/23 1513 128/69     Systolic BP Percentile --      Diastolic BP Percentile --      Pulse Rate 11/24/23 1513 68     Resp 11/24/23 1513 17     Temp 11/24/23 1513 98 F (36.7 C)     Temp Source 11/24/23 1513 Oral     SpO2 11/24/23 1513 99 %     Weight 11/24/23 1514 180 lb (81.6 kg)     Height 11/24/23 1514 5\' 6"  (1.676 m)     Head Circumference --      Peak Flow --      Pain Score 11/24/23 1514 0     Pain Loc --      Pain Education --      Exclude from Growth Chart --     Most recent vital signs: Vitals:   11/24/23 1513  BP: 128/69  Pulse: 68  Resp: 17  Temp: 98 F (36.7 C)  SpO2: 99%     Constitutional: Alert, NAD. Able to speak in complete sentences without cough or dyspnea  Eyes: Conjunctivae are normal.  Head: Atraumatic. Nose: No congestion/rhinnorhea. Mouth/Throat: Mucous membranes are moist.   Neck: Painless ROM. Supple. No JVD, nodes, thyromegaly  Cardiovascular:   Good peripheral circulation.RRR no murmurs, gallops, rubs  Respiratory: Normal respiratory effort.  No retractions. Clear to auscultation bilaterally without  wheezing or crackles  Gastrointestinal: Soft and nontender.  Musculoskeletal:  no deformity Right foot: Presence of circular opening of the skin, no erythema, no tender, no warmth.  No presence of foreign body, sensation is decreased.  Pulses positive. Neurologic:  MAE spontaneously. No gross focal neurologic deficits are appreciated.  Injury with Skin:  Skin is warm, dry and intact. No rash noted. Psychiatric: Mood and affect are normal. Speech and behavior are normal.    ED Results / Procedures / Treatments   Labs (all labs ordered are listed, but only abnormal results are displayed) Labs Reviewed - No data to display   EKG     RADIOLOGY I independently reviewed and interpreted imaging and agree with radiologists findings.      PROCEDURES:  Critical Care performed:   Procedures   MEDICATIONS ORDERED IN ED: Medications  Tdap (BOOSTRIX) injection 0.5 mL (0.5 mLs Intramuscular Given 11/24/23 1709)   Clinical Course as of 11/24/23 1833  Wed Nov 24, 2023  1832 There is a 3 x less than 1 mm metallic density overlying the proximal lateral second toe soft tissues at the level of the distal shaft of the proximal phalanx. This appears to  be within 2 mm of the lateral skin surface of the second toe. Recommend clinical correlation for a foreign body.   [AE]    Clinical Course User Index [AE] Awilda Lennox, PA-C    IMPRESSION / MDM / ASSESSMENT AND PLAN / ED COURSE  I reviewed the triage vital signs and the nursing notes.  Differential diagnosis includes, but is not limited to, diabetic foot, foreign body, cellulitis, osteomyelitis  Patient's presentation is most consistent with acute complicated illness / injury requiring diagnostic workup.   Patient's diagnosis is consistent with right foot injury, small foreign body on right second toe. I independently reviewed and interpreted imaging and agree with radiologists findings ruling out fracture or osteomyelitis.   Physical exam is reassuring, right foot does not have erythema, warmness, or signs of infection.  Patient is diabetic using insulin  with decreased sensation on his feet, patient got Tdap booster and will be discharged with Augmentin.  I did review the patient's allergies and medications.The patient is in stable and satisfactory condition for discharge home  Patient will be discharged home with prescriptions for Augmentin. Patient is to follow up with podiatrist as needed or otherwise directed. Patient is given ED precautions to return to the ED for any worsening or new symptoms. Discussed plan of care with patient, answered all of patient's questions, Patient agreeable to plan of care. Advised patient to take medications according to the instructions on the label. Discussed possible side effects of new medications. Patient verbalized understanding.    FINAL CLINICAL IMPRESSION(S) / ED DIAGNOSES   Final diagnoses:  Injury of right foot, initial encounter     Rx / DC Orders   ED Discharge Orders          Ordered    amoxicillin-clavulanate (AUGMENTIN XR) 1000-62.5 MG 12 hr tablet  2 times daily        11/24/23 1724             Note:  This document was prepared using Dragon voice recognition software and may include unintentional dictation errors.   Awilda Lennox, PA-C 11/24/23 2324    Kandee Orion, MD 11/27/23 [phone]

## 2023-11-24 NOTE — Discharge Instructions (Addendum)
 You have been diagnosed with foot injury.  Please take Augmentin every 12 hours.  Please call Dr. Larey Plenty to make an appointment for a follow-up.  Please check frequently your feet.  Please come back to ED or go to your PCP if you have new symptoms or symptoms worsen.

## 2023-11-24 NOTE — ED Triage Notes (Signed)
 Patient to ED via POV for right foot injury. PT reports scrapping the side of his foot on a nail 5 days ago. Now blue and purple per patient. Ambulatory to triage.

## 2024-01-24 NOTE — Telephone Encounter (Signed)
   Patient was advised of his medication that was sent in and he was given specialist number for a appointment.  [facility]

## 2024-01-27 ENCOUNTER — Encounter: Payer: Self-pay | Admitting: Emergency Medicine

## 2024-01-27 ENCOUNTER — Emergency Department

## 2024-01-27 ENCOUNTER — Observation Stay
Admission: EM | Admit: 2024-01-27 | Discharge: 2024-01-28 | Disposition: A | Discharge status: Home / Self Care | Attending: Internal Medicine | Admitting: Internal Medicine

## 2024-01-27 ENCOUNTER — Other Ambulatory Visit: Payer: Self-pay

## 2024-01-27 DIAGNOSIS — R079 Chest pain, unspecified: Secondary | ICD-10-CM | POA: Diagnosis present

## 2024-01-27 DIAGNOSIS — I959 Hypotension, unspecified: Secondary | ICD-10-CM | POA: Insufficient documentation

## 2024-01-27 DIAGNOSIS — F17210 Nicotine dependence, cigarettes, uncomplicated: Secondary | ICD-10-CM | POA: Insufficient documentation

## 2024-01-27 DIAGNOSIS — I130 Hypertensive heart and chronic kidney disease with heart failure and stage 1 through stage 4 chronic kidney disease, or unspecified chronic kidney disease: Secondary | ICD-10-CM | POA: Insufficient documentation

## 2024-01-27 DIAGNOSIS — Z6828 Body mass index (BMI) 28.0-28.9, adult: Secondary | ICD-10-CM | POA: Insufficient documentation

## 2024-01-27 DIAGNOSIS — E785 Hyperlipidemia, unspecified: Secondary | ICD-10-CM | POA: Diagnosis not present

## 2024-01-27 DIAGNOSIS — N179 Acute kidney failure, unspecified: Secondary | ICD-10-CM | POA: Diagnosis not present

## 2024-01-27 DIAGNOSIS — N1831 Chronic kidney disease, stage 3a: Secondary | ICD-10-CM | POA: Diagnosis not present

## 2024-01-27 DIAGNOSIS — I5032 Chronic diastolic (congestive) heart failure: Secondary | ICD-10-CM | POA: Insufficient documentation

## 2024-01-27 DIAGNOSIS — Z7901 Long term (current) use of anticoagulants: Secondary | ICD-10-CM | POA: Insufficient documentation

## 2024-01-27 DIAGNOSIS — I2510 Atherosclerotic heart disease of native coronary artery without angina pectoris: Secondary | ICD-10-CM | POA: Insufficient documentation

## 2024-01-27 DIAGNOSIS — E663 Overweight: Secondary | ICD-10-CM | POA: Insufficient documentation

## 2024-01-27 DIAGNOSIS — R109 Unspecified abdominal pain: Secondary | ICD-10-CM | POA: Diagnosis present

## 2024-01-27 DIAGNOSIS — R1011 Right upper quadrant pain: Principal | ICD-10-CM | POA: Insufficient documentation

## 2024-01-27 DIAGNOSIS — I10 Essential (primary) hypertension: Secondary | ICD-10-CM | POA: Diagnosis present

## 2024-01-27 DIAGNOSIS — R1084 Generalized abdominal pain: Principal | ICD-10-CM

## 2024-01-27 DIAGNOSIS — N390 Urinary tract infection, site not specified: Secondary | ICD-10-CM | POA: Diagnosis not present

## 2024-01-27 DIAGNOSIS — F17200 Nicotine dependence, unspecified, uncomplicated: Secondary | ICD-10-CM | POA: Diagnosis present

## 2024-01-27 DIAGNOSIS — Z79899 Other long term (current) drug therapy: Secondary | ICD-10-CM | POA: Diagnosis not present

## 2024-01-27 LAB — URINALYSIS, ROUTINE W REFLEX MICROSCOPIC
Bacteria, UA: NONE SEEN
Bilirubin Urine: NEGATIVE
Glucose, UA: >=500 mg/dL — AB
Hgb urine dipstick: NEGATIVE
Ketones, ur: NEGATIVE mg/dL
Nitrite: NEGATIVE
Protein, ur: 30 mg/dL — AB
RBC / HPF: >50 RBC/hpf (ref 0–5)
Specific Gravity, Urine: 1.016 (ref 1.005–1.030)
pH: 5 (ref 5.0–8.0)

## 2024-01-27 LAB — BRAIN NATRIURETIC PEPTIDE: B Natriuretic Peptide: 80 pg/mL (ref 0.0–100.0)

## 2024-01-27 LAB — LACTIC ACID, PLASMA: Lactic Acid, Venous: 1.6 mmol/L (ref 0.5–1.9)

## 2024-01-27 LAB — CBC
HCT: 38.4 % — ABNORMAL LOW (ref 39.0–52.0)
Hemoglobin: 13.4 g/dL (ref 13.0–17.0)
MCH: 29.3 pg (ref 26.0–34.0)
MCHC: 34.9 g/dL (ref 30.0–36.0)
MCV: 84 fL (ref 80.0–100.0)
Platelets: 241 K/uL (ref 150–400)
RBC: 4.57 MIL/uL (ref 4.22–5.81)
RDW: 12.6 % (ref 11.5–15.5)
WBC: 10.4 K/uL (ref 4.0–10.5)
nRBC: 0 % (ref 0.0–0.2)

## 2024-01-27 LAB — COMPREHENSIVE METABOLIC PANEL WITH GFR
ALT: 53 U/L — ABNORMAL HIGH (ref 0–44)
AST: 28 U/L (ref 15–41)
Albumin: 3.4 g/dL — ABNORMAL LOW (ref 3.5–5.0)
Alkaline Phosphatase: 234 U/L — ABNORMAL HIGH (ref 38–126)
Anion gap: 12 (ref 5–15)
BUN: 32 mg/dL — ABNORMAL HIGH (ref 8–23)
CO2: 27 mmol/L (ref 22–32)
Calcium: 9.5 mg/dL (ref 8.9–10.3)
Chloride: 95 mmol/L — ABNORMAL LOW (ref 98–111)
Creatinine, Ser: 3.03 mg/dL — ABNORMAL HIGH (ref 0.61–1.24)
GFR, Estimated: 23 mL/min — ABNORMAL LOW (ref >60)
Glucose, Bld: 289 mg/dL — ABNORMAL HIGH (ref 70–99)
Potassium: 3.6 mmol/L (ref 3.5–5.1)
Sodium: 134 mmol/L — ABNORMAL LOW (ref 135–145)
Total Bilirubin: 0.9 mg/dL (ref 0.0–1.2)
Total Protein: 7.8 g/dL (ref 6.5–8.1)

## 2024-01-27 LAB — LIPASE, BLOOD: Lipase: 34 U/L (ref 11–51)

## 2024-01-27 LAB — TROPONIN I (HIGH SENSITIVITY): Troponin I (High Sensitivity): 59 ng/L — ABNORMAL HIGH (ref <18)

## 2024-01-27 LAB — GLUCOSE, RANDOM: Glucose, Bld: 449 mg/dL — ABNORMAL HIGH (ref 70–99)

## 2024-01-27 LAB — LIPID PANEL
Cholesterol: 192 mg/dL (ref 0–200)
HDL: 33 mg/dL — ABNORMAL LOW (ref >40)
LDL Cholesterol: 108 mg/dL — ABNORMAL HIGH (ref 0–99)
Total CHOL/HDL Ratio: 5.8 ratio
Triglycerides: 255 mg/dL — ABNORMAL HIGH (ref <150)
VLDL: 51 mg/dL — ABNORMAL HIGH (ref 0–40)

## 2024-01-27 LAB — CBG MONITORING, ED: Glucose-Capillary: 426 mg/dL — ABNORMAL HIGH (ref 70–99)

## 2024-01-27 LAB — PROTIME-INR
INR: 1 (ref 0.8–1.2)
Prothrombin Time: 13.6 s (ref 11.4–15.2)

## 2024-01-27 LAB — APTT: aPTT: 27 s (ref 24–36)

## 2024-01-27 MED ORDER — ONDANSETRON HCL 4 MG/2ML IJ SOLN: 4.0000 mg | Freq: Three times a day (TID) | INTRAMUSCULAR | Status: DC | PRN

## 2024-01-27 MED ORDER — NITROGLYCERIN 0.4 MG SL SUBL: 0.4000 mg | SUBLINGUAL_TABLET | SUBLINGUAL | Status: DC | PRN

## 2024-01-27 MED ORDER — OXYCODONE-ACETAMINOPHEN 5-325 MG PO TABS: 1.0000 | ORAL_TABLET | ORAL | Status: DC | PRN

## 2024-01-27 MED ORDER — INSULIN ASPART 100 UNIT/ML IJ SOLN: 0.0000 [IU] | Freq: Every day | INTRAMUSCULAR | Status: DC

## 2024-01-27 MED ORDER — ACETAMINOPHEN 325 MG PO TABS: 650.0000 mg | ORAL_TABLET | Freq: Four times a day (QID) | ORAL | Status: DC | PRN

## 2024-01-27 MED ORDER — LACTATED RINGERS IV BOLUS: 1000.0000 mL | Freq: Once | INTRAVENOUS | Status: DC

## 2024-01-27 MED ORDER — SODIUM CHLORIDE 0.9 % IV SOLN: INTRAVENOUS | Status: DC

## 2024-01-27 MED ORDER — NICOTINE 21 MG/24HR TD PT24
21.0000 mg | MEDICATED_PATCH | Freq: Every day | TRANSDERMAL | Status: DC
  Filled 2024-01-27: qty 1

## 2024-01-27 MED ORDER — INSULIN ASPART 100 UNIT/ML IJ SOLN
10.0000 [IU] | Freq: Once | INTRAMUSCULAR | Status: AC
  Administered 2024-01-27: 10 [IU] via SUBCUTANEOUS
  Filled 2024-01-27: qty 1

## 2024-01-27 MED ORDER — HEPARIN SODIUM (PORCINE) 5000 UNIT/ML IJ SOLN
5000.0000 [IU] | Freq: Three times a day (TID) | INTRAMUSCULAR | Status: DC
  Administered 2024-01-27 – 2024-01-28 (×2): 5000 [IU] via SUBCUTANEOUS
  Filled 2024-01-27 (×2): qty 1

## 2024-01-27 MED ORDER — MORPHINE SULFATE (PF) 2 MG/ML IV SOLN: 2.0000 mg | INTRAVENOUS | Status: DC | PRN

## 2024-01-27 MED ORDER — HYDRALAZINE HCL 20 MG/ML IJ SOLN: 5.0000 mg | INTRAMUSCULAR | Status: DC | PRN

## 2024-01-27 MED ORDER — INSULIN ASPART 100 UNIT/ML IJ SOLN: 0.0000 [IU] | Freq: Three times a day (TID) | INTRAMUSCULAR | Status: DC

## 2024-01-27 NOTE — ED Triage Notes (Signed)
 Patient to ED via POV for upper abd pain and dizziness for a couple days. Pt reports BP in the 70's at home. States intermittent CP.

## 2024-01-27 NOTE — ED Provider Notes (Signed)
 [facility] Provider Note    Event Date/Time   First MD Initiated Contact with Patient 01/27/24 1514     (approximate)   History   Chief Complaint Abdominal Pain   HPI  Chad Frisch Sr. is a 63 y.o. male with past medical history of hypertension, hyperlipidemia, diabetes, CAD, CHF, and PUD who presents to the ED complaining of abdominal pain.  Patient reports that he has been dealing with 1 week of increasing diffuse abdominal pain with occasional sharp pain in his chest.  He reports some mild difficulty breathing but denies any fevers or cough.  He has not had any nausea, vomiting, or diarrhea but does note some Gasiorowski-colored stool.  He has begun to feel increasingly dizzy and lightheaded throughout the week, noted to be hypotensive on arrival.     Physical Exam   Triage Vital Signs: ED Triage Vitals  Encounter Vitals Group     BP 01/27/24 1506 (!) 79/59     Girls Systolic BP Percentile --      Girls Diastolic BP Percentile --      Boys Systolic BP Percentile --      Boys Diastolic BP Percentile --      Pulse Rate 01/27/24 1506 63     Resp 01/27/24 1506 18     Temp 01/27/24 1506 97.7 F (36.5 C)     Temp Source 01/27/24 1506 Oral     SpO2 01/27/24 1506 100 %     Weight 01/27/24 1507 170 lb (77.1 kg)     Height 01/27/24 1507 5' 5 (1.651 m)     Head Circumference --      Peak Flow --      Pain Score 01/27/24 1507 8     Pain Loc --      Pain Education --      Exclude from Growth Chart --     Most recent vital signs: Vitals:   01/27/24 1600 01/27/24 1630  BP: 122/73 (!) 154/78  Pulse: 65 63  Resp: 18 17  Temp:    SpO2: 100% 100%    Constitutional: Alert and oriented. Eyes: Conjunctivae are normal. Head: Atraumatic. Nose: No congestion/rhinnorhea. Mouth/Throat: Mucous membranes are moist.  Cardiovascular: Normal rate, regular rhythm. Grossly normal heart sounds.  2+ radial pulses bilaterally. Respiratory: Normal respiratory  effort.  No retractions. Lungs CTAB. Gastrointestinal: Soft and diffusely tender to palpation with voluntary guarding. No distention. Musculoskeletal: No lower extremity tenderness nor edema.  Neurologic:  Normal speech and language. No gross focal neurologic deficits are appreciated.    ED Results / Procedures / Treatments   Labs (all labs ordered are listed, but only abnormal results are displayed) Labs Reviewed  COMPREHENSIVE METABOLIC PANEL WITH GFR - Abnormal; Notable for the following components:      Result Value   Sodium 134 (*)    Chloride 95 (*)    Glucose, Bld 289 (*)    BUN 32 (*)    Creatinine, Ser 3.03 (*)    Albumin 3.4 (*)    ALT 53 (*)    Alkaline Phosphatase 234 (*)    GFR, Estimated 23 (*)    All other components within normal limits  CBC - Abnormal; Notable for the following components:   HCT 38.4 (*)    All other components within normal limits  URINALYSIS, ROUTINE W REFLEX MICROSCOPIC - Abnormal; Notable for the following components:   Color, Urine YELLOW (*)    APPearance CLOUDY (*)  Glucose, UA >=500 (*)    Protein, ur 30 (*)    Leukocytes,Ua LARGE (*)    All other components within normal limits  TROPONIN I (HIGH SENSITIVITY) - Abnormal; Notable for the following components:   Troponin I (High Sensitivity) 59 (*)    All other components within normal limits  CULTURE, BLOOD (ROUTINE X 2)  CULTURE, BLOOD (ROUTINE X 2)  LIPASE, BLOOD  LACTIC ACID, PLASMA  TROPONIN I (HIGH SENSITIVITY)     EKG  ED ECG REPORT I, Carlin Palin, the attending physician, personally viewed and interpreted this ECG.   Date: 01/27/2024  EKG Time: 15:08  Rate: 65  Rhythm: normal sinus rhythm  Axis: Normal  Intervals:none  ST&T Change: Diffuse ST depressions with minimal ST depressions anteriorly   ED ECG REPORT I, Carlin Palin, the attending physician, personally viewed and interpreted this ECG.   Date: 01/27/2024  EKG Time: 15:17  Rate: 61  Rhythm:  normal sinus rhythm  Axis: Normal  Intervals:none  ST&T Change: Diffuse ST depressions with minimal ST elevation anteriorly, <37mm  RADIOLOGY Chest x-ray reviewed and interpreted by me with no infiltrate, edema, or effusion.  PROCEDURES:  Critical Care performed: Yes, see critical care procedure note(s)  .Critical Care  Performed by: Palin Carlin, MD Authorized by: Palin Carlin, MD   Critical care provider statement:    Critical care time (minutes):  30   Critical care time was exclusive of:  Separately billable procedures and treating other patients and teaching time   Critical care was necessary to treat or prevent imminent or life-threatening deterioration of the following conditions:  Shock   Critical care was time spent personally by me on the following activities:  Development of treatment plan with patient or surrogate, discussions with consultants, evaluation of patient's response to treatment, examination of patient, ordering and review of laboratory studies, ordering and review of radiographic studies, ordering and performing treatments and interventions, pulse oximetry, re-evaluation of patient's condition and review of old charts   I assumed direction of critical care for this patient from another provider in my specialty: no     Care discussed with: admitting provider      MEDICATIONS ORDERED IN ED: Medications  lactated ringers  bolus 1,000 mL (1,000 mLs Intravenous Not Given 01/27/24 1651)     IMPRESSION / MDM / ASSESSMENT AND PLAN / ED COURSE  I reviewed the triage vital signs and the nursing notes.                              63 y.o. male with past medical history of hypertension, hyperlipidemia, diabetes, CAD, CHF, and PUD who presents to the ED complaining of 1 week of increasing abdominal pain with occasional sharp pain in his chest, noted to be hypertensive today.  Patient's presentation is most consistent with acute presentation with potential threat  to life or bodily function.  Differential diagnosis includes, but is not limited to, sepsis, pneumonia, UTI, pancreatitis, hepatitis, cholecystitis, biliary colic, bowel obstruction, appendicitis, perforated bowel, GI bleed, ACS, PE, pneumothorax.  Patient ill-appearing but in no acute distress, vital signs remarkable for hypotension with BP of 79/59, but are otherwise reassuring.  EKG shows normal sinus rhythm with diffuse ST depressions, minimal anterior ST elevation noted.  Findings reviewed with Dr. Anner of cardiology for possible STEMI, he does not feel that presentation is consistent with STEMI.  Pain does seem to be primarily in his abdomen and is  reproducible with palpation, will further assess with CT imaging.  He declines pain or nausea medication, BP improving with IV fluid bolus.  Troponin mildly elevated but similar to prior baseline, patient does have significant AKI which could cause the slight elevation compared to his previous troponins.  No electrolyte abnormality noted and no significant anemia or leukocytosis.  BP improved following IV fluids and low suspicion for sepsis at this time.  Chest x-ray is unremarkable, CT of the abdomen/pelvis shows cholelithiasis without evidence of cholecystitis.  His pain is significantly improved on reassessment, case discussed with hospitalist for admission for AKI.      FINAL CLINICAL IMPRESSION(S) / ED DIAGNOSES   Final diagnoses:  Generalized abdominal pain  Chest pain, unspecified type  AKI (acute kidney injury) (HCC)     Rx / DC Orders   ED Discharge Orders     None        Note:  This document was prepared using Dragon voice recognition software and may include unintentional dictation errors.   Willo Dunnings, MD 01/27/24 [phone]

## 2024-01-27 NOTE — H&P (Signed)
 History and Physical    Chad Blough Sr. FMW:[id number] DOB: 04/02/1961 DOA: 01/27/2024  Referring MD/NP/PA:   PCP: [facility]   Patient coming from:  The patient is coming from home.     Chief Complaint: chest pain and abdominal pain  HPI: Chad Dahan Sr. is a 63 y.o. male with medical history significant of      Data reviewed independently and ED Course: pt was found to have     ***       EKG: I have personally reviewed.  Not done in ED, will get one.   ***   Review of Systems:   General: no fevers, chills, no body weight gain, has poor appetite, has fatigue HEENT: no blurry vision, hearing changes or sore throat Respiratory: no dyspnea, coughing, wheezing CV: no chest pain, no palpitations GI: no nausea, vomiting, abdominal pain, diarrhea, constipation GU: no dysuria, burning on urination, increased urinary frequency, hematuria  Ext: no leg edema Neuro: no unilateral weakness, numbness, or tingling, no vision change or hearing loss Skin: no rash, no skin tear. MSK: No muscle spasm, no deformity, no limitation of range of movement in spin Heme: No easy bruising.  Travel history: No recent long distant travel.   Allergy: No Known Allergies  Past Medical History:  Diagnosis Date   Diabetes mellitus without complication (HCC)    High cholesterol    Hypertension    Stomach ulcer     Past Surgical History:  Procedure Laterality Date   COLONOSCOPY     FEMUR CLOSED REDUCTION      Social History:  reports that he has been smoking cigars. He started smoking about 28 years ago. He has never used smokeless tobacco. He reports that he does not drink alcohol and does not use drugs.  Family History:  Family History  Problem Relation Age of Onset   Diabetes Father      Prior to Admission medications   Medication Sig Start Date End Date Taking? Authorizing Provider  aspirin  EC 81 MG tablet Take 1 tablet by mouth daily.    [provider]   dapagliflozin  propanediol (FARXIGA ) 10 MG TABS tablet Take 1 tablet by mouth daily. 06/08/23 06/07/24  [provider]  furosemide  (LASIX ) 20 MG tablet Take 3 tablets (60 mg total) by mouth daily. 08/21/23 11/19/23  Jens Durand, MD  hydrALAZINE  (APRESOLINE ) 50 MG tablet Take 1 tablet (50 mg total) by mouth 3 (three) times daily as needed (systolic BP >150). Patient taking differently: Take 50 mg by mouth 3 (three) times daily. 08/20/23 09/19/23  Jens Durand, MD  insulin  NPH-regular Human (70-30) 100 UNIT/ML injection Inject 15 Units into the skin 2 (two) times daily with a meal. 08/20/23   Jens Durand, MD  Insulin  Syringe-Needle U-100 [place] INSULIN  SYRINGE) 31G X 5/16 0.5 ML MISC use twice a day with humulin  70/30 Patient not taking: Reported on 08/23/2023 03/15/23   Trudy Anthony HERO, MD  metoprolol  succinate (TOPROL -XL) 100 MG 24 hr tablet Take 1 tablet (100 mg total) by mouth daily. Take with or immediately following a meal. 08/20/23 09/19/23  Jens Durand, MD  rosuvastatin  (CRESTOR ) 20 MG tablet Take 1 tablet (20 mg total) by mouth daily. 08/24/23 11/22/23  Donette Ellouise LABOR, FNP  sacubitril -valsartan  (ENTRESTO ) 49-51 MG Take 1 tablet by mouth 2 (two) times daily. 08/20/23 08/19/24  Jens Durand, MD  spironolactone  (ALDACTONE ) 25 MG tablet Take 1 tablet (25 mg total) by mouth daily. 08/20/23 09/19/23  Jens Durand,  MD  tamsulosin  (FLOMAX ) 0.4 MG CAPS capsule Take 1 capsule (0.4 mg total) by mouth daily. 08/20/23 08/19/24  Jens Durand, MD    Physical Exam: Vitals:   01/27/24 1515 01/27/24 1530 01/27/24 1600 01/27/24 1630  BP: 106/66 124/69 122/73 (!) 154/78  Pulse: 61 63 65 63  Resp: (!) 8 20 18 17   Temp:      TempSrc:      SpO2: 100% 100% 100% 100%  Weight:      Height:       General: Not in acute distress HEENT:       Eyes: PERRL, EOMI, no jaundice       ENT: No discharge from the ears and nose, no pharynx injection, no tonsillar enlargement.        Neck: No JVD, no  bruit, no mass felt. Heme: No neck lymph node enlargement. Cardiac: S1/S2, RRR, No murmurs, No gallops or rubs. Respiratory: No rales, wheezing, rhonchi or rubs. GI: Soft, nondistended, nontender, no rebound pain, no organomegaly, BS present. GU: No hematuria Ext: No pitting leg edema bilaterally. 1+DP/PT pulse bilaterally. Musculoskeletal: No joint deformities, No joint redness or warmth, no limitation of ROM in spin. Skin: No rashes.  Neuro: Alert, oriented X3, cranial nerves II-XII grossly intact, moves all extremities normally. Muscle strength 5/5 in all extremities, sensation to light touch intact. Brachial reflex 2+ bilaterally. Knee reflex 1+ bilaterally. Negative Babinski's sign. Normal finger to nose test. Psych: Patient is not psychotic, no suicidal or hemocidal ideation.  Labs on Admission: I have personally reviewed following labs and imaging studies  CBC: Recent Labs  Lab 01/27/24 1518  WBC 10.4  HGB 13.4  HCT 38.4*  MCV 84.0  PLT 241   Basic Metabolic Panel: Recent Labs  Lab 01/27/24 1518  NA 134*  K 3.6  CL 95*  CO2 27  GLUCOSE 289*  BUN 32*  CREATININE 3.03*  CALCIUM  9.5   GFR: Estimated Creatinine Clearance: 24.2 mL/min (A) (by C-G formula based on SCr of 3.03 mg/dL (H)). Liver Function Tests: Recent Labs  Lab 01/27/24 1518  AST 28  ALT 53*  ALKPHOS 234*  BILITOT 0.9  PROT 7.8  ALBUMIN 3.4*   Recent Labs  Lab 01/27/24 1518  LIPASE 34   No results for input(s): AMMONIA in the last 168 hours. Coagulation Profile: No results for input(s): INR, PROTIME in the last 168 hours. Cardiac Enzymes: No results for input(s): CKTOTAL, CKMB, CKMBINDEX, TROPONINI in the last 168 hours. BNP (last 3 results) No results for input(s): PROBNP in the last 8760 hours. HbA1C: No results for input(s): HGBA1C in the last 72 hours. CBG: No results for input(s): GLUCAP in the last 168 hours. Lipid Profile: No results for input(s): CHOL,  HDL, LDLCALC, TRIG, CHOLHDL, LDLDIRECT in the last 72 hours. Thyroid Function Tests: No results for input(s): TSH, T4TOTAL, FREET4, T3FREE, THYROIDAB in the last 72 hours. Anemia Panel: No results for input(s): VITAMINB12, FOLATE, FERRITIN, TIBC, IRON, RETICCTPCT in the last 72 hours. Urine analysis:    Component Value Date/Time   COLORURINE YELLOW (A) 01/27/2024 1606   APPEARANCEUR CLOUDY (A) 01/27/2024 1606   APPEARANCEUR Clear 05/08/2014 0846   LABSPEC 1.016 01/27/2024 1606   LABSPEC 1.035 05/08/2014 0846   PHURINE 5.0 01/27/2024 1606   GLUCOSEU >=500 (A) 01/27/2024 1606   GLUCOSEU >=500 05/08/2014 0846   HGBUR NEGATIVE 01/27/2024 1606   BILIRUBINUR NEGATIVE 01/27/2024 1606   BILIRUBINUR Negative 05/08/2014 0846   KETONESUR NEGATIVE 01/27/2024 1606   PROTEINUR  30 (A) 01/27/2024 1606   NITRITE NEGATIVE 01/27/2024 1606   LEUKOCYTESUR LARGE (A) 01/27/2024 1606   LEUKOCYTESUR Negative 05/08/2014 0846   Sepsis Labs: @LABRCNTIP (procalcitonin:4,lacticidven:4) )No results found for this or any previous visit (from the past 240 hours).   Radiological Exams on Admission:   Assessment/Plan Principal Problem:   Chest pain Active Problems:   Coronary artery disease   Abdominal pain   Acute renal failure superimposed on stage 3a chronic kidney disease (HCC)   Hypotension   HTN (hypertension)   Dyslipidemia   Chronic diastolic CHF (congestive heart failure) (HCC)   Tobacco dependence   Overweight (BMI 25.0-29.9)   Assessment and Plan: No notes have been filed under this hospital service. Service: Hospitalist      Principal Problem:   Chest pain Active Problems:   Coronary artery disease   Abdominal pain   Acute renal failure superimposed on stage 3a chronic kidney disease (HCC)   Hypotension   HTN (hypertension)   Dyslipidemia   Chronic diastolic CHF (congestive heart failure) (HCC)   Tobacco dependence   Overweight (BMI  25.0-29.9)    DVT ppx: SQ Heparin          SQ Lovenox   Code Status: Full code   ***  Family Communication:     not done, no family member is at bed side.              Yes, patient's    at bed side.       by phone   ***  Disposition Plan:  Anticipate discharge back to previous environment  Consults called:    Admission status and Level of care: Telemetry Cardiac:    for obs as inpt        Dispo: The patient is from: {From:23814}              Anticipated d/c is to: {To:23815}              Anticipated d/c date is: {Days:23816}              Patient currently {Medically stable:23817}    Severity of Illness:  {Observation/Inpatient:21159}       Date of Service 01/27/2024    Caleb Exon [facility]   If 7PM-7AM, please contact night-coverage [email or web address] 01/27/2024, 6:56 PM

## 2024-01-28 ENCOUNTER — Observation Stay (HOSPITAL_BASED_OUTPATIENT_CLINIC_OR_DEPARTMENT_OTHER)
Admit: 2024-01-28 | Discharge: 2024-01-28 | Disposition: A | Discharge status: Home / Self Care | Attending: Internal Medicine | Admitting: Internal Medicine

## 2024-01-28 ENCOUNTER — Observation Stay

## 2024-01-28 DIAGNOSIS — N179 Acute kidney failure, unspecified: Secondary | ICD-10-CM | POA: Diagnosis not present

## 2024-01-28 DIAGNOSIS — N390 Urinary tract infection, site not specified: Secondary | ICD-10-CM | POA: Diagnosis present

## 2024-01-28 DIAGNOSIS — E785 Hyperlipidemia, unspecified: Secondary | ICD-10-CM | POA: Diagnosis not present

## 2024-01-28 DIAGNOSIS — R109 Unspecified abdominal pain: Secondary | ICD-10-CM | POA: Diagnosis not present

## 2024-01-28 DIAGNOSIS — R9431 Abnormal electrocardiogram [ECG] [EKG]: Secondary | ICD-10-CM

## 2024-01-28 DIAGNOSIS — I959 Hypotension, unspecified: Secondary | ICD-10-CM | POA: Diagnosis not present

## 2024-01-28 LAB — ECHOCARDIOGRAM COMPLETE
AR max vel: 2.64 cm2
AV Area VTI: 2.49 cm2
AV Area mean vel: 2.47 cm2
AV Mean grad: 5 mmHg
AV Peak grad: 8.6 mmHg
Ao pk vel: 1.47 m/s
Area-P 1/2: 3.6 cm2
Calc EF: 47.8 %
Height: 65 in
MV VTI: 2.38 cm2
S' Lateral: 3 cm
Single Plane A2C EF: 39.1 %
Single Plane A4C EF: 53.6 %
Weight: 2720 [oz_av]

## 2024-01-28 LAB — BASIC METABOLIC PANEL WITH GFR
Anion gap: 10 (ref 5–15)
BUN: 39 mg/dL — ABNORMAL HIGH (ref 8–23)
CO2: 28 mmol/L (ref 22–32)
Calcium: 8.6 mg/dL — ABNORMAL LOW (ref 8.9–10.3)
Chloride: 100 mmol/L (ref 98–111)
Creatinine, Ser: 2.48 mg/dL — ABNORMAL HIGH (ref 0.61–1.24)
GFR, Estimated: 29 mL/min — ABNORMAL LOW (ref >60)
Glucose, Bld: 362 mg/dL — ABNORMAL HIGH (ref 70–99)
Potassium: 3.8 mmol/L (ref 3.5–5.1)
Sodium: 138 mmol/L (ref 135–145)

## 2024-01-28 LAB — CBG MONITORING, ED
Glucose-Capillary: 392 mg/dL — ABNORMAL HIGH (ref 70–99)
Glucose-Capillary: 154 mg/dL — ABNORMAL HIGH (ref 70–99)
Glucose-Capillary: 70 mg/dL (ref 70–99)

## 2024-01-28 LAB — CBC
HCT: 33 % — ABNORMAL LOW (ref 39.0–52.0)
Hemoglobin: 11.9 g/dL — ABNORMAL LOW (ref 13.0–17.0)
MCH: 30.3 pg (ref 26.0–34.0)
MCHC: 36.1 g/dL — ABNORMAL HIGH (ref 30.0–36.0)
MCV: 84 fL (ref 80.0–100.0)
Platelets: 185 K/uL (ref 150–400)
RBC: 3.93 MIL/uL — ABNORMAL LOW (ref 4.22–5.81)
RDW: 12.5 % (ref 11.5–15.5)
WBC: 7.2 K/uL (ref 4.0–10.5)
nRBC: 0 % (ref 0.0–0.2)

## 2024-01-28 LAB — TROPONIN I (HIGH SENSITIVITY)
Troponin I (High Sensitivity): 44 ng/L — ABNORMAL HIGH (ref <18)
Troponin I (High Sensitivity): 51 ng/L — ABNORMAL HIGH (ref <18)
Troponin I (High Sensitivity): 49 ng/L — ABNORMAL HIGH (ref <18)

## 2024-01-28 LAB — HEMOGLOBIN A1C
Hgb A1c MFr Bld: 12.3 % — ABNORMAL HIGH (ref 4.8–5.6)
Mean Plasma Glucose: 306.31 mg/dL

## 2024-01-28 LAB — HIV ANTIBODY (ROUTINE TESTING W REFLEX): HIV Screen 4th Generation wRfx: NONREACTIVE

## 2024-01-28 MED ORDER — INSULIN ASPART 100 UNIT/ML IJ SOLN
0.0000 [IU] | Freq: Three times a day (TID) | INTRAMUSCULAR | Status: DC
  Administered 2024-01-28: 15 [IU] via SUBCUTANEOUS
  Administered 2024-01-28: 3 [IU] via SUBCUTANEOUS
  Filled 2024-01-28 (×2): qty 1

## 2024-01-28 MED ORDER — METOPROLOL SUCCINATE ER 50 MG PO TB24
50.0000 mg | ORAL_TABLET | Freq: Every day | ORAL | Status: DC
  Administered 2024-01-28: 50 mg via ORAL
  Filled 2024-01-28: qty 1

## 2024-01-28 MED ORDER — FERROUS SULFATE 325 (65 FE) MG PO TABS
325.0000 mg | ORAL_TABLET | Freq: Every day | ORAL | Status: DC
  Administered 2024-01-28: 325 mg via ORAL
  Filled 2024-01-28: qty 1

## 2024-01-28 MED ORDER — HYDRALAZINE HCL 50 MG PO TABS: 50.0000 mg | ORAL_TABLET | Freq: Three times a day (TID) | ORAL | 1 refills | Status: DC

## 2024-01-28 MED ORDER — ASPIRIN 81 MG PO TBEC
81.0000 mg | DELAYED_RELEASE_TABLET | Freq: Every day | ORAL | Status: DC
  Administered 2024-01-28: 81 mg via ORAL
  Filled 2024-01-28: qty 1

## 2024-01-28 MED ORDER — HYDRALAZINE HCL 50 MG PO TABS
50.0000 mg | ORAL_TABLET | Freq: Three times a day (TID) | ORAL | Status: DC
Start: 2024-01-28 — End: 2024-01-28

## 2024-01-28 MED ORDER — ESOMEPRAZOLE MAGNESIUM 40 MG PO CPDR
40.0000 mg | DELAYED_RELEASE_CAPSULE | Freq: Every day | ORAL | 1 refills | Status: AC
Start: 2024-01-28 — End: 2025-01-27

## 2024-01-28 MED ORDER — INSULIN ASPART PROT & ASPART (70-30 MIX) 100 UNIT/ML ~~LOC~~ SUSP
15.0000 [IU] | Freq: Two times a day (BID) | SUBCUTANEOUS | Status: DC
  Filled 2024-01-28: qty 10

## 2024-01-28 MED ORDER — INSULIN ASPART PROT & ASPART (70-30 MIX) 100 UNIT/ML ~~LOC~~ SUSP
12.0000 [IU] | Freq: Two times a day (BID) | SUBCUTANEOUS | Status: DC
  Filled 2024-01-28: qty 10

## 2024-01-28 MED ORDER — INSULIN ASPART 100 UNIT/ML IJ SOLN
3.0000 [IU] | Freq: Three times a day (TID) | INTRAMUSCULAR | Status: DC
  Administered 2024-01-28: 3 [IU] via SUBCUTANEOUS
  Filled 2024-01-28: qty 1

## 2024-01-28 MED ORDER — PANTOPRAZOLE SODIUM 40 MG PO TBEC
40.0000 mg | DELAYED_RELEASE_TABLET | Freq: Every day | ORAL | Status: DC
  Administered 2024-01-28: 40 mg via ORAL
  Filled 2024-01-28: qty 1

## 2024-01-28 MED ORDER — SODIUM CHLORIDE 0.9 % IV SOLN
1.0000 g | INTRAVENOUS | Status: DC
  Administered 2024-01-28: 1 g via INTRAVENOUS
  Filled 2024-01-28: qty 10

## 2024-01-28 NOTE — Discharge Summary (Addendum)
 Physician Discharge Summary   Patient: Chad Good. MRN: [id number] DOB: 1961-04-08  Admit date:     01/27/2024  Discharge date: 01/28/24  Discharge Physician: Amaryllis Dare   PCP: [facility]   Recommendations at discharge:  Please obtain renal function in next couple of days We are holding his home Entresto , Lasix  and spironolactone -please restart if renal function are at baseline. Patient need close monitoring and follow-up for better control of diabetes with significant increase in A1c and he was not taking his insulin  appropriately. Follow-up with primary care provider Patient might get benefit from a gastroenterology referral as concern of epigastric pain related to food-he was given a trial of PPI. Please follow-up on echocardiogram result  Discharge Diagnoses: Principal Problem:   Abdominal pain Active Problems:   Coronary artery disease   Chest pain   Acute renal failure superimposed on stage 3a chronic kidney disease (HCC)   Hypotension   HTN (hypertension)   Dyslipidemia   Chronic diastolic CHF (congestive heart failure) (HCC)   Tobacco dependence   UTI (urinary tract infection)   Overweight (BMI 25.0-29.9)   Hospital Course: Taken from H&P.  Chad Good. is a 63 y.o. male with medical history significant of HTN, HLD, DM, CAD, dCHF, BPH,  chronically elevated troponin (~30s), who presents with abdominal pain which started about a week ago, mostly located upper abdomen, occasionally have mild nausea but no vomiting or diarrhea.  Patient was recently given Keflex by PCP for possible UTI.  On presentation he was found to be hypotensive at 79/59 which improved to 154/78 with IV fluid.  Labs with WBC at 10.4, worsening renal function with creatinine at 3.03, BUN 32 with recent baseline of 1.75 of creatinine, I UA with large leukocytes and squamous cells, troponin 59>> 44, mildly elevated alkaline phosphatase at 234 which seems chronic. CXR was  negative for any acute abnormality. CT abdomen and pelvis with minimal cholelithiasis and no other significant abnormality. EKG with NSR, QTc 438, mild J-point elevation and V1-V4 and T wave inversion in anterolateral leads with poor R wave progression.  7/25: Vital stable, some decrease in hemoglobin to 11.9 but also line decreased likely dilutional effect with IV fluid, improving renal function with creatinine at 2.48 which is still above the baseline, troponin at 49 with a flat curve.  RUQ ultrasound with cholelithiasis but no concern of cholecystitis.  Patient was having mild epigastric discomfort after eating spicy and greasy food. No exertional component.  Giving him trial of PPI.  He will get benefit from outpatient GI evaluation.  Echocardiogram done with pending results.  Patient was eager to go home.  He has upcoming appointment with PCP on Monday and they can follow-up on echocardiogram results and give him referral to see a gastroenterologist for further evaluation.  Patient was taking extra doses of Lasix  by mistake, apparently his home dose was decreased from 60 mg to 20 mg recently and he was keep taking medications from both the bottles without realizing that it is the same medicine.  Renal functions started improving but still not at baseline, may be some element of ATN due to significant hypotension.  Patient was instructed to hold Lasix , spironolactone  and Entresto , he has upcoming PCP appointment on Monday and they can repeat renal function and instruct him to restart medication if appropriate.  He was instructed to continue taking home metoprolol  and hydralazine  to keep the blood pressure under Good control as it is now started trending up.  Patient also has uncontrolled diabetes with hyperglycemia and A1c of 12.3, it was 6.3 37-month ago.  He needed close follow-up with PCP for better control. Per patient he was not taking his insulin  as directed.  Patient will continue with  rest of his home medications except with the changes as mentioned above and follow-up with his providers for further assistance.   Consultants: None Procedures performed: None Disposition: Home Diet recommendation:  Discharge Diet Orders (From admission, onward)     Start     Ordered   01/28/24 0000  Diet - low sodium heart healthy        01/28/24 1516           Cardiac and Carb modified diet DISCHARGE MEDICATION: Allergies as of 01/28/2024   No Known Allergies      Medication List     PAUSE taking these medications    Entresto  49-51 MG Wait to take this until your doctor or other care provider tells you to start again. Generic drug: sacubitril -valsartan  Take 1 tablet by mouth 2 (two) times daily.   furosemide  20 MG tablet Wait to take this until your doctor or other care provider tells you to start again. Commonly known as: LASIX  Take 3 tablets (60 mg total) by mouth daily. What changed: how much to take   spironolactone  25 MG tablet Wait to take this until your doctor or other care provider tells you to start again. Commonly known as: ALDACTONE  Take 1 tablet (25 mg total) by mouth daily.       TAKE these medications    aspirin  EC 81 MG tablet Take 1 tablet by mouth daily.   cephALEXin 500 MG capsule Commonly known as: KEFLEX Take 500 mg by mouth 2 (two) times daily.   dapagliflozin  propanediol 10 MG Tabs tablet Commonly known as: FARXIGA  Take 1 tablet by mouth daily.   esomeprazole 40 MG capsule Commonly known as: NexIUM Take 1 capsule (40 mg total) by mouth daily.   FeroSul 325 (65 Fe) MG tablet Generic drug: ferrous sulfate Take 325 mg by mouth daily.   hydrALAZINE  50 MG tablet Commonly known as: APRESOLINE  Take 1 tablet (50 mg total) by mouth 3 (three) times daily.   insulin  NPH-regular Human (70-30) 100 UNIT/ML injection Inject 15 Units into the skin 2 (two) times daily with a meal. What changed:  how much to take additional  instructions   metoprolol  succinate 100 MG 24 hr tablet Commonly known as: TOPROL -XL Take 1 tablet (100 mg total) by mouth daily. Take with or immediately following a meal.   rosuvastatin  20 MG tablet Commonly known as: CRESTOR  Take 1 tablet (20 mg total) by mouth daily.   tamsulosin  0.4 MG Caps capsule Commonly known as: FLOMAX  Take 1 capsule (0.4 mg total) by mouth daily.   UltiCare Insulin  Syringe 31G X 5/16 0.5 ML Misc Generic drug: Insulin  Syringe-Needle U-100 use twice a day with humulin  70/30        Discharge Exam: Filed Weights   01/27/24 1507  Weight: 77.1 kg   General.  Well-developed gentleman, in no acute distress. Pulmonary.  Lungs clear bilaterally, normal respiratory effort. CV.  Regular rate and rhythm, no JVD, rub or murmur. Abdomen.  Soft, nontender, nondistended, BS positive. CNS.  Alert and oriented .  No focal neurologic deficit. Extremities.  No edema, no cyanosis, pulses intact and symmetrical. Psychiatry.  Judgment and insight appears normal.   Condition at discharge: stable  The results of significant diagnostics from this  hospitalization (including imaging, microbiology, ancillary and laboratory) are listed below for reference.   Imaging Studies: ECHOCARDIOGRAM COMPLETE Result Date: 01/28/2024    ECHOCARDIOGRAM REPORT   Patient Name:   Deondrick Searls Good. Date of Exam: 01/28/2024 Medical Rec #:  [id number]            Height:       65.0 in Accession #:    [id number]           Weight:       170.0 lb Date of Birth:  Jan 06, 1961             BSA:          1.846 m Patient Age:    63 years             BP:           160/77 mmHg Patient Gender: M                    HR:           69 bpm. Exam Location:  [facility] Procedure: 2D Echo, Cardiac Doppler and Color Doppler (Both Spectral and Color            Flow Doppler were utilized during procedure). Indications:     Abnormal ECG R94.31  History:         Patient has prior history of Echocardiogram examinations, most                   recent 03/13/2023. Abnormal ECG; Risk Factors:Diabetes.  Sonographer:     Ashley McNeely-Sloane Referring Phys:  [id number] Claritza July Diagnosing Phys: Lonni Hanson MD IMPRESSIONS  1. Left ventricular ejection fraction, by estimation, is 55 to 60%. The left ventricle has normal function. The left ventricle has no regional wall motion abnormalities. There is mild left ventricular hypertrophy. Left ventricular diastolic parameters are consistent with Grade I diastolic dysfunction (impaired relaxation).  2. Right ventricular systolic function is mildly reduced. The right ventricular size is mildly enlarged. Tricuspid regurgitation signal is inadequate for assessing PA pressure.  3. The mitral valve is normal in structure. Trivial mitral valve regurgitation. No evidence of mitral stenosis.  4. The aortic valve has an indeterminant number of cusps. There is mild thickening of the aortic valve. Aortic valve regurgitation is not visualized. Aortic valve sclerosis is present, with no evidence of aortic valve stenosis.  5. The inferior vena cava is normal in size with greater than 50% respiratory variability, suggesting right atrial pressure of 3 mmHg. FINDINGS  Left Ventricle: Left ventricular ejection fraction, by estimation, is 55 to 60%. The left ventricle has normal function. The left ventricle has no regional wall motion abnormalities. The left ventricular internal cavity size was normal in size. There is  mild left ventricular hypertrophy. Left ventricular diastolic parameters are consistent with Grade I diastolic dysfunction (impaired relaxation). Right Ventricle: The right ventricular size is mildly enlarged. No increase in right ventricular wall thickness. Right ventricular systolic function is mildly reduced. Tricuspid regurgitation signal is inadequate for assessing PA pressure. Left Atrium: Left atrial size was normal in size. Right Atrium: Right atrial size was normal in size. Pericardium: There is  no evidence of pericardial effusion. Mitral Valve: The mitral valve is normal in structure. Trivial mitral valve regurgitation. No evidence of mitral valve stenosis. MV peak gradient, 3.4 mmHg. The mean mitral valve gradient is 2.0 mmHg. Tricuspid Valve: The tricuspid valve is grossly normal. Tricuspid valve regurgitation is  not demonstrated. Aortic Valve: The aortic valve has an indeterminant number of cusps. There is mild thickening of the aortic valve. Aortic valve regurgitation is not visualized. Aortic valve sclerosis is present, with no evidence of aortic valve stenosis. Aortic valve mean gradient measures 5.0 mmHg. Aortic valve peak gradient measures 8.6 mmHg. Aortic valve area, by VTI measures 2.49 cm. Pulmonic Valve: The pulmonic valve was not well visualized. Pulmonic valve regurgitation is trivial. No evidence of pulmonic stenosis. Aorta: The aortic root and ascending aorta are structurally normal, with no evidence of dilitation. Pulmonary Artery: The pulmonary artery is not well seen. Venous: The inferior vena cava is normal in size with greater than 50% respiratory variability, suggesting right atrial pressure of 3 mmHg. IAS/Shunts: The interatrial septum was not well visualized.  LEFT VENTRICLE PLAX 2D LVIDd:         4.70 cm      Diastology LVIDs:         3.00 cm      LV e' medial:    5.66 cm/s LV PW:         1.20 cm      LV E/e' medial:  12.2 LV IVS:        1.10 cm      LV e' lateral:   5.11 cm/s LVOT diam:     2.10 cm      LV E/e' lateral: 13.5 LV SV:         71 LV SV Index:   39 LVOT Area:     3.46 cm  LV Volumes (MOD) LV vol d, MOD A2C: 103.0 ml LV vol d, MOD A4C: 89.6 ml LV vol s, MOD A2C: 62.7 ml LV vol s, MOD A4C: 41.6 ml LV SV MOD A2C:     40.3 ml LV SV MOD A4C:     89.6 ml LV SV MOD BP:      47.5 ml RIGHT VENTRICLE             IVC RV Basal diam:  4.50 cm     IVC diam: 1.10 cm RV S prime:     10.40 cm/s TAPSE (M-mode): 1.6 cm LEFT ATRIUM             Index        RIGHT ATRIUM           Index  LA diam:        3.80 cm 2.06 cm/m   RA Area:     15.30 cm LA Vol (A2C):   81.2 ml 43.98 ml/m  RA Volume:   40.30 ml  21.83 ml/m LA Vol (A4C):   22.4 ml 12.13 ml/m LA Biplane Vol: 44.0 ml 23.83 ml/m  AORTIC VALVE                    PULMONIC VALVE AV Area (Vmax):    2.64 cm     PV Vmax:        1.11 m/s AV Area (Vmean):   2.47 cm     PV Vmean:       76.800 cm/s AV Area (VTI):     2.49 cm     PV VTI:         0.226 m AV Vmax:           147.00 cm/s  PV Peak grad:   4.9 mmHg AV Vmean:          97.200 cm/s  PV Mean grad:  3.0 mmHg AV VTI:            0.286 m      RVOT Peak grad: 2 mmHg AV Peak Grad:      8.6 mmHg AV Mean Grad:      5.0 mmHg LVOT Vmax:         112.00 cm/s LVOT Vmean:        69.200 cm/s LVOT VTI:          0.206 m LVOT/AV VTI ratio: 0.72  AORTA Ao Root diam: 2.90 cm Ao Asc diam:  2.70 cm MITRAL VALVE MV Area (PHT): 3.60 cm    SHUNTS MV Area VTI:   2.38 cm    Systemic VTI:  0.21 m MV Peak grad:  3.4 mmHg    Systemic Diam: 2.10 cm MV Mean grad:  2.0 mmHg    Pulmonic VTI:  0.170 m MV Vmax:       0.92 m/s MV Vmean:      65.8 cm/s MV Decel Time: 211 msec MV E velocity: 69.20 cm/s MV A velocity: 79.10 cm/s MV E/A ratio:  0.87 Lonni End MD Electronically signed by Lonni Hanson MD Signature Date/Time: 01/28/2024/3:15:11 PM    Final    US  ABDOMEN LIMITED RUQ (LIVER/GB) Result Date: 01/28/2024 EXAM: Right Upper Quadrant Abdominal Ultrasound 01/28/2024 02:38:00 AM TECHNIQUE: Real-time ultrasonography of the right upper quadrant of the abdomen was performed. COMPARISON: CT abdomen / pelvis dated 01/27/2024. CLINICAL HISTORY: 28062 Gallstone 28062. Gallstone. FINDINGS: LIVER: The liver demonstrates normal echogenicity. No intrahepatic biliary ductal dilatation. No evidence of mass. BILIARY SYSTEM: Layering gallstones measuring up to 8 mm. No pericholecystic fluid or wall thickening. Negative sonographic Murphy's sign. Common bile duct measures 4 mm. OTHER: No right upper quadrant ascites. IMPRESSION:  1. Cholelithiasis, without associated sonographic findings to suggest acute cholecystitis. Electronically signed by: Pinkie Pebbles MD 01/28/2024 02:59 AM EDT RP Workstation: [id number]   CT ABDOMEN PELVIS WO CONTRAST Result Date: 01/27/2024 CLINICAL DATA:  Acute upper abdominal pain. EXAM: CT ABDOMEN AND PELVIS WITHOUT CONTRAST TECHNIQUE: Multidetector CT imaging of the abdomen and pelvis was performed following the standard protocol without IV contrast. RADIATION DOSE REDUCTION: This exam was performed according to the departmental dose-optimization program which includes automated exposure control, adjustment of the mA and/or kV according to patient size and/or use of iterative reconstruction technique. COMPARISON:  Nov 20, 2020. FINDINGS: Lower chest: No acute abnormality. Hepatobiliary: Minimal cholelithiasis. No biliary dilatation. Liver is unremarkable on these unenhanced images. Pancreas: Unremarkable. No pancreatic ductal dilatation or surrounding inflammatory changes. Spleen: Normal in size without focal abnormality. Adrenals/Urinary Tract: Adrenal glands are unremarkable. Kidneys are normal, without renal calculi, focal lesion, or hydronephrosis. Bladder is unremarkable. Stomach/Bowel: Stomach is within normal limits. Appendix appears normal. No evidence of bowel wall thickening, distention, or inflammatory changes. Vascular/Lymphatic: Aortic atherosclerosis. No enlarged abdominal or pelvic lymph nodes. Reproductive: Prostate is unremarkable. Other: No abdominal wall hernia or abnormality. No abdominopelvic ascites. Musculoskeletal: No acute or significant osseous findings. IMPRESSION: Minimal cholelithiasis. No other definite abnormality seen in the abdomen or pelvis. Aortic Atherosclerosis (ICD10-I70.0). Electronically Signed   By: Lynwood Landy Raddle M.D.   On: 01/27/2024 17:55   DG Chest Portable 1 View Result Date: 01/27/2024 CLINICAL DATA:  Chest pain EXAM: PORTABLE CHEST 1 VIEW COMPARISON:   08/17/2023 FINDINGS: Temporary pacing patches limits the exam. No acute airspace disease, pleural effusion or pneumothorax. Borderline cardiac enlargement. Aortic atherosclerosis. IMPRESSION: No active disease. Borderline cardiac enlargement. Electronically Signed   By:  Luke Bun M.D.   On: 01/27/2024 16:31    Microbiology: Results for orders placed or performed during the hospital encounter of 01/27/24  Culture, blood (routine x 2)     Status: None (Preliminary result)   Collection Time: 01/27/24  4:08 PM   Specimen: BLOOD LEFT ARM  Result Value Ref Range Status   Specimen Description BLOOD LEFT ARM  Final   Special Requests   Final    BOTTLES DRAWN AEROBIC AND ANAEROBIC Blood Culture results may not be optimal due to an inadequate volume of blood received in culture bottles   Culture   Final    NO GROWTH < 24 HOURS Performed at [facility], [address] [place], [place] [address]    Report Status PENDING  Incomplete  Culture, blood (routine x 2)     Status: None (Preliminary result)   Collection Time: 01/27/24  4:08 PM   Specimen: BLOOD RIGHT ARM  Result Value Ref Range Status   Specimen Description BLOOD RIGHT ARM  Final   Special Requests   Final    BOTTLES DRAWN AEROBIC AND ANAEROBIC Blood Culture adequate volume   Culture   Final    NO GROWTH < 24 HOURS Performed at [facility], [address] [place], [place] [address]    Report Status PENDING  Incomplete    Labs: CBC: Recent Labs  Lab 01/27/24 1518 01/28/24 0526  WBC 10.4 7.2  HGB 13.4 11.9*  HCT 38.4* 33.0*  MCV 84.0 84.0  PLT 241 185   Basic Metabolic Panel: Recent Labs  Lab 01/27/24 1518 01/27/24 2128 01/28/24 0526  NA 134*  --  138  K 3.6  --  3.8  CL 95*  --  100  CO2 27  --  28  GLUCOSE 289* 449* 362*  BUN 32*  --  39*  CREATININE 3.03*  --  2.48*  CALCIUM  9.5  --  8.6*   Liver Function Tests: Recent Labs  Lab 01/27/24 1518  AST 28  ALT 53*  ALKPHOS 234*   BILITOT 0.9  PROT 7.8  ALBUMIN 3.4*   CBG: Recent Labs  Lab 01/27/24 2126 01/28/24 0835 01/28/24 1133 01/28/24 1318  GLUCAP 426* 392* 154* 70    Discharge time spent: greater than 30 minutes.  This record has been created using Conservation officer, historic buildings. Errors have been sought and corrected,but may not always be located. Such creation errors do not reflect on the standard of care.   Signed: Amaryllis Dare, MD [facility] 01/28/2024

## 2024-01-28 NOTE — Inpatient Diabetes Management (Addendum)
 Inpatient Diabetes Program Recommendations  AACE/ADA: New Consensus Statement on Inpatient Glycemic Control (2015)  Target Ranges:  Prepandial:   less than 140 mg/dL      Peak postprandial:   less than 180 mg/dL (1-2 hours)      Critically ill patients:  140 - 180 mg/dL   Lab Results  Component Value Date   GLUCAP 392 (H) 01/28/2024   HGBA1C 12.3 (H) 01/27/2024    Review of Glycemic Control  Latest Reference Range & Units 01/27/24 21:26 01/28/24 08:35  Glucose-Capillary 70 - 99 mg/dL 573 (H) 607 (H)   Diabetes history: DM  Outpatient Diabetes medications:  Farxiga  10 mg daily, 70/30 20 units bid Current orders for Inpatient glycemic control:  Novolog  0-15 units tid with meals and HS 70/30 mix- 15 units bid Novolog  3 units tid with meals  Inpatient Diabetes Program Recommendations:    Please d/c Novolog  meal coverage. Also consider d/c of 70/30 15 units bid.  Patient is NPO.  Consider reducing Novolog  to sensitive q 4 hours.   Addendum 1400: Spoke to patient at bedside and told him about current A1C.  He states that he does fingersticks 1 -2 times daily.  He admits that often he only takes the insulin  once daily b/c if he drops to 150 mg/dL, he feels low/symptomatic.  I explained that he needs insulin  2 times daily in order for it to last 24 hours and that he may need lower dose of insulin  but still needs twice daily.  He has appointment with endocrinology on Monday with [facility].  Encouraged him to discuss his concerns and also to ask about a CGM for monitoring to help prevent low blood sugars.  Patient appreciative of information.  No further needs at this time.  At discharge may need to reduce 70/30 to 10 units bid?    Thanks,  Randall Bullocks, RN, BC-ADM Inpatient Diabetes Coordinator Pager [phone]  (8a-5p)

## 2024-01-28 NOTE — Hospital Course (Addendum)
 Taken from H&P.  Chad Donnice Alstrom Sr. is a 63 y.o. male with medical history significant of HTN, HLD, DM, CAD, dCHF, BPH,  chronically elevated troponin (~30s), who presents with abdominal pain which started about a week ago, mostly located upper abdomen, occasionally have mild nausea but no vomiting or diarrhea.  Patient was recently given Keflex by PCP for possible UTI.  On presentation he was found to be hypotensive at 79/59 which improved to 154/78 with IV fluid.  Labs with WBC at 10.4, worsening renal function with creatinine at 3.03, BUN 32 with recent baseline of 1.75 of creatinine, I UA with large leukocytes and squamous cells, troponin 59>> 44, mildly elevated alkaline phosphatase at 234 which seems chronic. CXR was negative for any acute abnormality. CT abdomen and pelvis with minimal cholelithiasis and no other significant abnormality. EKG with NSR, QTc 438, mild J-point elevation and V1-V4 and T wave inversion in anterolateral leads with poor R wave progression.  7/25: Vital stable, some decrease in hemoglobin to 11.9 but also line decreased likely dilutional effect with IV fluid, improving renal function with creatinine at 2.48 which is still above the baseline, troponin at 49 with a flat curve.  RUQ ultrasound with cholelithiasis but no concern of cholecystitis.  Patient was having mild epigastric discomfort after eating spicy and greasy food. No exertional component.  Giving him trial of PPI.  He will get benefit from outpatient GI evaluation.  Echocardiogram done with pending results.  Patient was eager to go home.  He has upcoming appointment with PCP on Monday and they can follow-up on echocardiogram results and give him referral to see a gastroenterologist for further evaluation.  Patient was taking extra doses of Lasix  by mistake, apparently his home dose was decreased from 60 mg to 20 mg recently and he was keep taking medications from both the bottles without realizing that it  is the same medicine.  Patient was instructed to hold Lasix , spironolactone  and Entresto , he has upcoming PCP appointment on Monday and they can repeat renal function and instruct him to restart medication if appropriate.  He was instructed to continue taking home metoprolol  and hydralazine  to keep the blood pressure under good control as it is now started trending up.  Patient also has uncontrolled diabetes with hyperglycemia and A1c of 12.3, it was 6.3 36-month ago.  He needed close follow-up with PCP for better control. Per patient he was not taking his insulin  as directed.  Patient will continue with rest of his home medications except with the changes as mentioned above and follow-up with his providers for further assistance.

## 2024-01-29 LAB — URINE CULTURE: Culture: >=100000 — AB

## 2024-01-30 NOTE — Progress Notes (Cosign Needed)
 ED Antimicrobial Stewardship Positive Culture Follow Up   Chad Fielden Sr. is an 63 y.o. male who presented to [facility] on 01/27/2024 with a chief complaint of  Chief Complaint  Patient presents with   Abdominal Pain    Recent Results (from the past 720 hours)  Culture, blood (routine x 2)     Status: None (Preliminary result)   Collection Time: 01/27/24  4:08 PM   Specimen: BLOOD LEFT ARM  Result Value Ref Range Status   Specimen Description BLOOD LEFT ARM  Final   Special Requests   Final    BOTTLES DRAWN AEROBIC AND ANAEROBIC Blood Culture results may not be optimal due to an inadequate volume of blood received in culture bottles   Culture   Final    NO GROWTH 3 DAYS Performed at [facility], [address] [place], [place] [address]    Report Status PENDING  Incomplete  Culture, blood (routine x 2)     Status: None (Preliminary result)   Collection Time: 01/27/24  4:08 PM   Specimen: BLOOD RIGHT ARM  Result Value Ref Range Status   Specimen Description BLOOD RIGHT ARM  Final   Special Requests   Final    BOTTLES DRAWN AEROBIC AND ANAEROBIC Blood Culture adequate volume   Culture   Final    NO GROWTH 3 DAYS Performed at [facility], [address], [place], [place] [address]    Report Status PENDING  Incomplete  Urine Culture (for pregnant, neutropenic or urologic patients or patients with an indwelling urinary catheter)     Status: Abnormal   Collection Time: 01/27/24  4:08 PM   Specimen: Urine, Clean Catch  Result Value Ref Range Status   Specimen Description   Final    URINE, CLEAN CATCH Performed at [facility], [address] [place], [place] [address]    Special Requests   Final    NONE Performed at [facility], [address] [place], [place] [address]    Culture >=100,000 COLONIES/mL YEAST (A)  Final   Report Status 01/29/2024 FINAL  Final    [x]  Treated with cephalexin, organism is not covered by this  antibiotic  Note : pt on SGLT2 (dapagliflozin ) which is associated with an increased risk of genitourinary fungal infection, UTI   New antibiotic prescription:   Fluconazole  100 mg po Daily x 14 days  (Crcl 29.6 ml/min)   Provider: Amaryllis Dare  01/30/24 1420: called patient at [phone] and called pt's preferred pharmacy- [facility] on [address] [place] [phone]. Called in above Rx. Explained to patient if not feeling better in 24-48 hrs to see provider   Allean Haas PharmD Clinical Pharmacist 01/30/2024

## 2024-02-01 LAB — CULTURE, BLOOD (ROUTINE X 2)
Culture: NO GROWTH
Culture: NO GROWTH
Special Requests: ADEQUATE

## 2024-04-02 ENCOUNTER — Emergency Department
Admission: EM | Admit: 2024-04-02 | Discharge: 2024-04-02 | Disposition: A | Discharge status: Home / Self Care | Attending: Emergency Medicine | Admitting: Emergency Medicine

## 2024-04-02 ENCOUNTER — Other Ambulatory Visit: Payer: Self-pay

## 2024-04-02 ENCOUNTER — Encounter: Payer: Self-pay | Admitting: Emergency Medicine

## 2024-04-02 DIAGNOSIS — N1831 Chronic kidney disease, stage 3a: Secondary | ICD-10-CM | POA: Diagnosis not present

## 2024-04-02 DIAGNOSIS — F17200 Nicotine dependence, unspecified, uncomplicated: Secondary | ICD-10-CM | POA: Diagnosis not present

## 2024-04-02 DIAGNOSIS — I2510 Atherosclerotic heart disease of native coronary artery without angina pectoris: Secondary | ICD-10-CM | POA: Insufficient documentation

## 2024-04-02 DIAGNOSIS — L03312 Cellulitis of back [any part except buttock]: Secondary | ICD-10-CM | POA: Insufficient documentation

## 2024-04-02 DIAGNOSIS — I5043 Acute on chronic combined systolic (congestive) and diastolic (congestive) heart failure: Secondary | ICD-10-CM | POA: Insufficient documentation

## 2024-04-02 DIAGNOSIS — I130 Hypertensive heart and chronic kidney disease with heart failure and stage 1 through stage 4 chronic kidney disease, or unspecified chronic kidney disease: Secondary | ICD-10-CM | POA: Diagnosis not present

## 2024-04-02 DIAGNOSIS — M549 Dorsalgia, unspecified: Secondary | ICD-10-CM | POA: Diagnosis present

## 2024-04-02 MED ORDER — CEPHALEXIN 500 MG PO CAPS: 500.0000 mg | ORAL_CAPSULE | Freq: Two times a day (BID) | ORAL | 0 refills | Status: AC

## 2024-04-02 MED ORDER — SULFAMETHOXAZOLE-TRIMETHOPRIM 800-160 MG PO TABS: 1.0000 | ORAL_TABLET | Freq: Two times a day (BID) | ORAL | 0 refills | Status: AC

## 2024-04-02 NOTE — ED Triage Notes (Signed)
 Patient to ED via POV for insect bite to back. States he was bit 1 week ago. Hx HTN- has not taken meds today

## 2024-04-02 NOTE — Discharge Instructions (Signed)
 Please take the antibiotics as prescribed.  Keep the area clean and dry, wash with soap and water.  Please return for any new, worsening, or change in symptoms or other concerns.  It was a pleasure caring for you today.

## 2024-04-02 NOTE — ED Notes (Signed)
 See triage note  Presents with possible insect bite ot back  States noticed this about 1 week ago

## 2024-04-02 NOTE — ED Provider Notes (Signed)
 [facility] Provider Note    Event Date/Time   First MD Initiated Contact with Patient 04/02/24 0809     (approximate)   History   Insect Bite   HPI  Chad Pavao Sr. is a 63 y.o. male who presents today for evaluation of possible bug bite to his right side of his back that appeared several days ago.  He denies fevers or chills.  He reports that the area is tender.  He is unsure what bit him.  No fevers or chills.  He is here because he is diabetic.  Patient Active Problem List   Diagnosis Date Noted   UTI (urinary tract infection) 01/28/2024   Chest pain 01/27/2024   Abdominal pain 01/27/2024   Chronic diastolic CHF (congestive heart failure) (HCC) 01/27/2024   Overweight (BMI 25.0-29.9) 01/27/2024   Acute renal failure superimposed on stage 3a chronic kidney disease (HCC) 01/27/2024   Hypotension 01/27/2024   CHF (congestive heart failure) (HCC) 08/16/2023   Acute on chronic combined systolic (congestive) and diastolic (congestive) heart failure (HCC) 03/12/2023   Dyslipidemia 03/12/2023   Tobacco dependence 03/12/2023   DNR (do not resuscitate) 03/12/2023   Hypertensive emergency 09/20/2022   Bilateral pleural effusion 09/20/2022   Anasarca 09/20/2022   Fracture of distal phalanx of left great toe 09/20/2022   Scrotal edema 09/20/2022   Acute respiratory failure with hypoxia (HCC) 09/20/2022   Anemia 09/20/2022   Acute on chronic diastolic CHF (congestive heart failure) (HCC) 09/20/2022   Coronary artery disease 09/20/2022   Diabetes mellitus, type II (HCC) 07/16/2020   HTN (hypertension) 07/16/2020   Troponin level elevated 07/16/2020   Elevated troponin 07/16/2020          Physical Exam   Triage Vital Signs: ED Triage Vitals  Encounter Vitals Group     BP 04/02/24 0756 (!) 185/107     Girls Systolic BP Percentile --      Girls Diastolic BP Percentile --      Boys Systolic BP Percentile --      Boys Diastolic BP Percentile  --      Pulse Rate 04/02/24 0756 84     Resp 04/02/24 0756 16     Temp 04/02/24 0756 97.7 F (36.5 C)     Temp Source 04/02/24 0756 Oral     SpO2 04/02/24 0756 96 %     Weight 04/02/24 0755 169 lb 12.1 oz (77 kg)     Height 04/02/24 0755 5' 5 (1.651 m)     Head Circumference --      Peak Flow --      Pain Score 04/02/24 0755 10     Pain Loc --      Pain Education --      Exclude from Growth Chart --     Most recent vital signs: Vitals:   04/02/24 0756  BP: (!) 185/107  Pulse: 84  Resp: 16  Temp: 97.7 F (36.5 C)  SpO2: 96%    Physical Exam Vitals and nursing note reviewed.  Constitutional:      General: Awake and alert. No acute distress.    Appearance: Normal appearance. The patient is normal weight.  HENT:     Head: Normocephalic and atraumatic.     Mouth: Mucous membranes are moist.  Eyes:     General: PERRL. Normal EOMs        Right eye: No discharge.        Left eye: No discharge.  Conjunctiva/sclera: Conjunctivae normal.  Cardiovascular:     Rate and Rhythm: Normal rate and regular rhythm.     Pulses: Normal pulses.  Pulmonary:     Effort: Pulmonary effort is normal. No respiratory distress.     Breath sounds: Normal breath sounds.  Abdominal:     Abdomen is soft. There is no abdominal tenderness. No rebound or guarding. No distention. Musculoskeletal:        General: No swelling. Normal range of motion.     Cervical back: Normal range of motion and neck supple.  Skin:    General: Skin is warm and dry.     Capillary Refill: Capillary refill takes less than 2 seconds.     Findings: Circular wound noted to right posterior thorax with mild surrounding erythema.  No fluctuance or swelling noted.  No drainage. Neurological:     Mental Status: The patient is awake and alert.      ED Results / Procedures / Treatments   Labs (all labs ordered are listed, but only abnormal results are displayed) Labs Reviewed - No data to  display   EKG     RADIOLOGY     PROCEDURES:  Critical Care performed:   Procedures   MEDICATIONS ORDERED IN ED: Medications - No data to display   IMPRESSION / MDM / ASSESSMENT AND PLAN / ED COURSE  I reviewed the triage vital signs and the nursing notes.   Differential diagnosis includes, but is not limited to, cellulitis, abscess, insect bite.  Patient is awake and alert, hemodynamically stable and afebrile.  Physical exam is consistent with developing cellulitis. No crepitus or bullae or hemodynamic instability or pain out of proportion to indicate deep space infection. No fluctuance to suggest cystic lesion such as abscess. No fevers or constitutional symptoms to suggest systemic infection.  Physical exam does not show any evidence of joint involvement. No lymphangitis. Patient will initiate oral antibiotics and follow-up closely as an outpatient.  Creatinine clearance calculated, 34.  Antibiotics dosed appropriately. Return if worsening or developing new symptoms in which case may require IV antibiotics. Discussed care plan, return precautions, and advised close outpatient follow-up. Patient agrees with plan of care.   Patient's presentation is most consistent with acute complicated illness / injury requiring diagnostic workup.    FINAL CLINICAL IMPRESSION(S) / ED DIAGNOSES   Final diagnoses:  Cellulitis of back except buttock     Rx / DC Orders   ED Discharge Orders          Ordered    cephALEXin (KEFLEX) 500 MG capsule  2 times daily        04/02/24 0816    sulfamethoxazole-trimethoprim (BACTRIM DS) 800-160 MG tablet  2 times daily        04/02/24 0816             Note:  This document was prepared using Dragon voice recognition software and may include unintentional dictation errors.   Danika Kluender E, PA-C 04/02/24 1056    Suzanne Kirsch, MD 04/02/24 1536

## 2024-05-15 ENCOUNTER — Emergency Department

## 2024-05-15 ENCOUNTER — Inpatient Hospital Stay
Admission: EM | Admit: 2024-05-15 | Discharge: 2024-05-16 | DRG: 291 | Disposition: A | Discharge status: Home / Self Care | Attending: Internal Medicine | Admitting: Internal Medicine

## 2024-05-15 ENCOUNTER — Encounter: Payer: Self-pay | Admitting: Emergency Medicine

## 2024-05-15 ENCOUNTER — Other Ambulatory Visit: Payer: Self-pay

## 2024-05-15 DIAGNOSIS — F17200 Nicotine dependence, unspecified, uncomplicated: Secondary | ICD-10-CM | POA: Diagnosis present

## 2024-05-15 DIAGNOSIS — I5033 Acute on chronic diastolic (congestive) heart failure: Secondary | ICD-10-CM | POA: Diagnosis present

## 2024-05-15 DIAGNOSIS — J189 Pneumonia, unspecified organism: Secondary | ICD-10-CM | POA: Diagnosis present

## 2024-05-15 DIAGNOSIS — Z79899 Other long term (current) drug therapy: Secondary | ICD-10-CM | POA: Diagnosis not present

## 2024-05-15 DIAGNOSIS — Z833 Family history of diabetes mellitus: Secondary | ICD-10-CM

## 2024-05-15 DIAGNOSIS — Z6828 Body mass index (BMI) 28.0-28.9, adult: Secondary | ICD-10-CM

## 2024-05-15 DIAGNOSIS — J9601 Acute respiratory failure with hypoxia: Secondary | ICD-10-CM | POA: Diagnosis present

## 2024-05-15 DIAGNOSIS — Z66 Do not resuscitate: Secondary | ICD-10-CM | POA: Diagnosis present

## 2024-05-15 DIAGNOSIS — Z7982 Long term (current) use of aspirin: Secondary | ICD-10-CM

## 2024-05-15 DIAGNOSIS — Z7984 Long term (current) use of oral hypoglycemic drugs: Secondary | ICD-10-CM | POA: Diagnosis not present

## 2024-05-15 DIAGNOSIS — N400 Enlarged prostate without lower urinary tract symptoms: Secondary | ICD-10-CM | POA: Diagnosis present

## 2024-05-15 DIAGNOSIS — I110 Hypertensive heart disease with heart failure: Principal | ICD-10-CM | POA: Diagnosis present

## 2024-05-15 DIAGNOSIS — I160 Hypertensive urgency: Secondary | ICD-10-CM | POA: Diagnosis present

## 2024-05-15 DIAGNOSIS — I5023 Acute on chronic systolic (congestive) heart failure: Secondary | ICD-10-CM | POA: Diagnosis not present

## 2024-05-15 DIAGNOSIS — E785 Hyperlipidemia, unspecified: Secondary | ICD-10-CM | POA: Diagnosis present

## 2024-05-15 DIAGNOSIS — E119 Type 2 diabetes mellitus without complications: Secondary | ICD-10-CM | POA: Diagnosis present

## 2024-05-15 DIAGNOSIS — E7800 Pure hypercholesterolemia, unspecified: Secondary | ICD-10-CM | POA: Diagnosis present

## 2024-05-15 DIAGNOSIS — Z8711 Personal history of peptic ulcer disease: Secondary | ICD-10-CM

## 2024-05-15 DIAGNOSIS — Z794 Long term (current) use of insulin: Secondary | ICD-10-CM | POA: Diagnosis not present

## 2024-05-15 DIAGNOSIS — I509 Heart failure, unspecified: Secondary | ICD-10-CM | POA: Diagnosis present

## 2024-05-15 DIAGNOSIS — Z7985 Long-term (current) use of injectable non-insulin antidiabetic drugs: Secondary | ICD-10-CM

## 2024-05-15 DIAGNOSIS — K219 Gastro-esophageal reflux disease without esophagitis: Secondary | ICD-10-CM | POA: Diagnosis present

## 2024-05-15 DIAGNOSIS — E663 Overweight: Secondary | ICD-10-CM | POA: Diagnosis present

## 2024-05-15 DIAGNOSIS — J9811 Atelectasis: Secondary | ICD-10-CM | POA: Diagnosis present

## 2024-05-15 DIAGNOSIS — I10 Essential (primary) hypertension: Secondary | ICD-10-CM | POA: Diagnosis present

## 2024-05-15 DIAGNOSIS — F17290 Nicotine dependence, other tobacco product, uncomplicated: Secondary | ICD-10-CM | POA: Diagnosis present

## 2024-05-15 LAB — GLUCOSE, CAPILLARY: Glucose-Capillary: 158 mg/dL — ABNORMAL HIGH (ref 70–99)

## 2024-05-15 LAB — TROPONIN I (HIGH SENSITIVITY): Troponin I (High Sensitivity): 35 ng/L — ABNORMAL HIGH (ref <18)

## 2024-05-15 LAB — BRAIN NATRIURETIC PEPTIDE: B Natriuretic Peptide: 1075.9 pg/mL — ABNORMAL HIGH (ref 0.0–100.0)

## 2024-05-15 LAB — CBC
HCT: 32.2 % — ABNORMAL LOW (ref 39.0–52.0)
Hemoglobin: 10 g/dL — ABNORMAL LOW (ref 13.0–17.0)
MCH: 30.4 pg (ref 26.0–34.0)
MCHC: 31.1 g/dL (ref 30.0–36.0)
MCV: 97.9 fL (ref 80.0–100.0)
Platelets: 303 K/uL (ref 150–400)
RBC: 3.29 MIL/uL — ABNORMAL LOW (ref 4.22–5.81)
RDW: 13.9 % (ref 11.5–15.5)
WBC: 7.5 K/uL (ref 4.0–10.5)
nRBC: 0 % (ref 0.0–0.2)

## 2024-05-15 LAB — BASIC METABOLIC PANEL WITH GFR
Anion gap: 12 (ref 5–15)
BUN: 36 mg/dL — ABNORMAL HIGH (ref 8–23)
CO2: 23 mmol/L (ref 22–32)
Calcium: 8.7 mg/dL — ABNORMAL LOW (ref 8.9–10.3)
Chloride: 105 mmol/L (ref 98–111)
Creatinine, Ser: 2.1 mg/dL — ABNORMAL HIGH (ref 0.61–1.24)
GFR, Estimated: 35 mL/min — ABNORMAL LOW (ref >60)
Glucose, Bld: 125 mg/dL — ABNORMAL HIGH (ref 70–99)
Potassium: 4.1 mmol/L (ref 3.5–5.1)
Sodium: 140 mmol/L (ref 135–145)

## 2024-05-15 LAB — PROCALCITONIN: Procalcitonin: <0.1 ng/mL

## 2024-05-15 LAB — CBG MONITORING, ED: Glucose-Capillary: 127 mg/dL — ABNORMAL HIGH (ref 70–99)

## 2024-05-15 MED ORDER — INSULIN ASPART PROT & ASPART (70-30 MIX) 100 UNIT/ML ~~LOC~~ SUSP: 15.0000 [IU] | Freq: Two times a day (BID) | SUBCUTANEOUS | Status: DC

## 2024-05-15 MED ORDER — HEPARIN SODIUM (PORCINE) 5000 UNIT/ML IJ SOLN
5000.0000 [IU] | Freq: Three times a day (TID) | INTRAMUSCULAR | Status: DC
  Administered 2024-05-15 – 2024-05-16 (×2): 5000 [IU] via SUBCUTANEOUS
  Filled 2024-05-15 (×2): qty 1

## 2024-05-15 MED ORDER — ACETAMINOPHEN 325 MG PO TABS: 650.0000 mg | ORAL_TABLET | Freq: Four times a day (QID) | ORAL | Status: DC | PRN

## 2024-05-15 MED ORDER — HYDRALAZINE HCL 20 MG/ML IJ SOLN
5.0000 mg | INTRAMUSCULAR | Status: DC | PRN
  Administered 2024-05-15 – 2024-05-16 (×3): 5 mg via INTRAVENOUS
  Filled 2024-05-15 (×3): qty 1

## 2024-05-15 MED ORDER — AZITHROMYCIN 500 MG IV SOLR
500.0000 mg | INTRAVENOUS | Status: DC
  Administered 2024-05-15: 500 mg via INTRAVENOUS
  Filled 2024-05-15 (×2): qty 5

## 2024-05-15 MED ORDER — INSULIN ASPART 100 UNIT/ML IJ SOLN: 0.0000 [IU] | Freq: Every day | INTRAMUSCULAR | Status: DC

## 2024-05-15 MED ORDER — ONDANSETRON HCL 4 MG/2ML IJ SOLN: 4.0000 mg | Freq: Four times a day (QID) | INTRAMUSCULAR | Status: DC | PRN

## 2024-05-15 MED ORDER — ONDANSETRON HCL 4 MG PO TABS: 4.0000 mg | ORAL_TABLET | Freq: Four times a day (QID) | ORAL | Status: DC | PRN

## 2024-05-15 MED ORDER — SENNOSIDES-DOCUSATE SODIUM 8.6-50 MG PO TABS: 1.0000 | ORAL_TABLET | Freq: Every evening | ORAL | Status: DC | PRN

## 2024-05-15 MED ORDER — FUROSEMIDE 10 MG/ML IJ SOLN
60.0000 mg | Freq: Once | INTRAMUSCULAR | Status: AC
  Administered 2024-05-15: 60 mg via INTRAVENOUS
  Filled 2024-05-15: qty 8

## 2024-05-15 MED ORDER — FUROSEMIDE 10 MG/ML IJ SOLN
20.0000 mg | Freq: Every day | INTRAMUSCULAR | Status: AC
  Administered 2024-05-15: 20 mg via INTRAVENOUS
  Filled 2024-05-15: qty 4

## 2024-05-15 MED ORDER — SODIUM CHLORIDE 0.9 % IV SOLN
2.0000 g | INTRAVENOUS | Status: DC
  Administered 2024-05-15: 2 g via INTRAVENOUS
  Filled 2024-05-15 (×2): qty 20

## 2024-05-15 MED ORDER — FERROUS SULFATE 325 (65 FE) MG PO TABS
325.0000 mg | ORAL_TABLET | Freq: Every day | ORAL | Status: DC
  Administered 2024-05-16: 325 mg via ORAL
  Filled 2024-05-15: qty 1

## 2024-05-15 MED ORDER — INSULIN GLARGINE-YFGN 100 UNIT/ML ~~LOC~~ SOLN
20.0000 [IU] | Freq: Every day | SUBCUTANEOUS | Status: DC
  Administered 2024-05-15: 20 [IU] via SUBCUTANEOUS
  Filled 2024-05-15 (×2): qty 0.2

## 2024-05-15 MED ORDER — FUROSEMIDE 10 MG/ML IJ SOLN
40.0000 mg | Freq: Two times a day (BID) | INTRAMUSCULAR | Status: DC
  Administered 2024-05-16: 40 mg via INTRAVENOUS
  Filled 2024-05-15: qty 4

## 2024-05-15 MED ORDER — ACETAMINOPHEN 650 MG RE SUPP: 650.0000 mg | Freq: Four times a day (QID) | RECTAL | Status: DC | PRN

## 2024-05-15 MED ORDER — PANTOPRAZOLE SODIUM 40 MG PO TBEC
40.0000 mg | DELAYED_RELEASE_TABLET | Freq: Every day | ORAL | Status: DC
  Administered 2024-05-16: 40 mg via ORAL
  Filled 2024-05-15: qty 1

## 2024-05-15 MED ORDER — INSULIN ASPART 100 UNIT/ML IJ SOLN: 0.0000 [IU] | Freq: Three times a day (TID) | INTRAMUSCULAR | Status: DC

## 2024-05-15 MED ORDER — ROSUVASTATIN CALCIUM 10 MG PO TABS
20.0000 mg | ORAL_TABLET | Freq: Every day | ORAL | Status: DC
  Filled 2024-05-15: qty 1

## 2024-05-15 MED ORDER — NICOTINE 21 MG/24HR TD PT24: 21.0000 mg | MEDICATED_PATCH | Freq: Every day | TRANSDERMAL | Status: DC | PRN

## 2024-05-15 NOTE — Assessment & Plan Note (Signed)
 Patient desatted to 32s on room air Per chest x-ray read, likely secondary to heart failure however I do see infiltrate in the right lower lobe Ceftriaxone  2 g IV daily, azithromycin 500 mg IV daily initiated for community-acquired pneumonia Incentive spirometry, flutter valve Continue oxygen supplementation to maintain SpO2 greater than 92% Continuous pulse oximetry

## 2024-05-15 NOTE — ED Notes (Signed)
 Called CCMD and added pt to board

## 2024-05-15 NOTE — ED Notes (Signed)
 Called the floor and informed them that the pt is on the way to room 254.

## 2024-05-15 NOTE — Progress Notes (Signed)
 Heart Failure Navigator Progress Note  Assessed for Heart & Vascular TOC clinic readiness.  Patient does not meet criteria due to current [facility] Cardiology patient.   Navigator will sign off at this time.  [name], RN, BSN [facility] Heart Failure Navigator Secure Chat Only

## 2024-05-15 NOTE — H&P (Signed)
 History and Physical   Nox Talent FMW:[id number] DOB: 1961-02-24 DOA: 05/15/2024  PCP: [facility]  Patient coming from: Home  I have personally briefly reviewed patient's old medical records in [facility] EMR.  Chief Concern: Shortness of breath  HPI: [name] is a 63 year old male with history of insulin -dependent diabetes mellitus, hyperlipidemia, hypertension, BPH, GERD, overweight status, heart failure preserved ejection fraction, who presents ED for chief concerns of shortness of breath and left-sided chest pain.  Vitals in the ED showed t of 98.2, rr of 19, hr 77, blood pressure initially 200/98, SpO2 of 88% initially on room air and improved to 98% on 2 L nasal cannula.  Chest x-ray: Read as low volume with bibasilar atelectasis/infiltrate, right greater than left.  Probable small right pleural effusion.  ED treatment: Furosemide  60 mg IV one-time dose.  After furosemide  treatment by EDP, patient reports he feels significantly better however with ambulation, patient desatted to the 70s. ------------------------------------------ At bedside, patient able to tell me his first and last name, age, location, current, the year.  He reports he has been short of breath on and off for about 4 to 5 days.  He reports he is compliant with his home furosemide .  He reports he is compliant with all his medication.  On review of oral intake:  Water: Patient drinks 1 bottle of water, 16 ounce per day. Juice: 1 glass. EtOH: None Tea: 1 cup/week. Coffee: 1 cup/day. Soda: Patient drinks about 2-3 bottles of Mountain Dew per day  He denies trauma to his person, known sick contacts.  He denies cough, fever, nausea, vomiting, chest pain, abdominal pain, dysuria, hematuria, diarrhea, syncope.  He endorses swelling of his lower extremities.  He does not know how much weight he has gained.  Social history: He lives at home on his own.  He denies tobacco, EtOH,  recreational drug use.  He works in his own [employer].  ROS: Constitutional: no weight change, no fever ENT/Mouth: no sore throat, no rhinorrhea Eyes: no eye pain, no vision changes Cardiovascular: no chest pain, + dyspnea,  + edema, no palpitations Respiratory: no cough, no sputum, no wheezing Gastrointestinal: no nausea, no vomiting, no diarrhea, no constipation Genitourinary: no urinary incontinence, no dysuria, no hematuria Musculoskeletal: no arthralgias, no myalgias Skin: no skin lesions, no pruritus, Neuro: + weakness, no loss of consciousness, no syncope Psych: no anxiety, no depression, no decrease appetite Heme/Lymph: no bruising, no bleeding  ED Course: Discussed with EDP, patient requiring hospitalization for chief concerns of acute hypoxic respiratory failure suspect secondary to heart failure exacerbation.  Assessment/Plan  Active Problems:   Acute hypoxic respiratory failure (HCC)   Acute exacerbation of CHF (congestive heart failure) (HCC)   Insulin  dependent type 2 diabetes mellitus (HCC)   Dyslipidemia   HTN (hypertension)   Tobacco dependence   DNR (do not resuscitate)   Assessment and Plan:  Acute exacerbation of CHF (congestive heart failure) (HCC) Secondary to increase fluid, oral intake, mainly Mountain Dew soda Strict I's and O's 01/28/2024: Echo read as estimate ejection fraction of 55 to 60%, grade 1 diastolic dysfunction Status post furosemide  60 mg per EDP, on admission I have ordered 20 mg one-time dose with supper 11/11: Furosemide  40 mg IV twice daily, 2 doses ordered  Acute hypoxic respiratory failure (HCC) Patient desatted to 70s on room air Per chest x-ray read, likely secondary to heart failure however I do see infiltrate in the right lower lobe Ceftriaxone  2  g IV daily, azithromycin 500 mg IV daily initiated for community-acquired pneumonia Incentive spirometry, flutter valve Continue oxygen supplementation to maintain SpO2  greater than 92% Continuous pulse oximetry  Dyslipidemia Home rosuvastatin  20 mg nightly resumed  Insulin  dependent type 2 diabetes mellitus (HCC) Home insulin  70/30 equivalent, 15 units twice daily with meals and long-acting insulin  Lantus  equivalent 20 mg nightly have been resumed on admission Ozempic will not be continued during hospitalization Insulin  SSI with at bedtime coverage ordered Goal inpatient blood glucose level is 140-180  Tobacco dependence As needed nicotine  patch ordered  HTN (hypertension) With hypertensive urgency Hydralazine  5 mg IV every 4 hours as needed for SBP greater 170, 5 days ordered  Chart reviewed.   DVT prophylaxis: Heparin  5000 units subcutaneous every 8 hours Code Status: DNR/DNI, confirmed with patient Diet: Heart healthy/carb modified Family Communication: Braulio, niece has been updated over the phone on speaker via patient's phone. Disposition Plan: Pending clinical course Consults called: None at this time Admission status: Telemetry, inpatient  Past Medical History:  Diagnosis Date   Diabetes mellitus without complication (HCC)    High cholesterol    Hypertension    Stomach ulcer    Past Surgical History:  Procedure Laterality Date   COLONOSCOPY     FEMUR CLOSED REDUCTION     Social History:  reports that he has been smoking cigars. He started smoking about 28 years ago. He has never used smokeless tobacco. He reports that he does not drink alcohol and does not use drugs.  No Known Allergies Family History  Problem Relation Age of Onset   Diabetes Father    Family history: Family history reviewed and not pertinent.  Prior to Admission medications   Medication Sig Start Date End Date Taking? Authorizing Provider  LANTUS  SOLOSTAR 100 UNIT/ML Solostar Pen Inject 20 Units into the skin at bedtime. 01/31/24  Yes [provider]  OZEMPIC, 0.25 OR 0.5 MG/DOSE, 2 MG/3ML SOPN Inject 0.25 mg into the skin once a week. 01/31/24   Yes [provider]  aspirin  EC 81 MG tablet Take 1 tablet by mouth daily. Patient not taking: Reported on 01/27/2024    [provider]  cephALEXin (KEFLEX) 500 MG capsule Take 500 mg by mouth 2 (two) times daily. 01/19/24   [provider]  dapagliflozin  propanediol (FARXIGA ) 10 MG TABS tablet Take 1 tablet by mouth daily. 06/08/23 06/07/24  [provider]  esomeprazole  (NEXIUM ) 40 MG capsule Take 1 capsule (40 mg total) by mouth daily. 01/28/24 01/27/25  Amin, Sumayya, MD  FEROSUL 325 (65 Fe) MG tablet Take 325 mg by mouth daily.    [provider]  furosemide  (LASIX ) 20 MG tablet Take 3 tablets (60 mg total) by mouth daily. Patient taking differently: Take 20 mg by mouth daily. 08/21/23 01/27/24  Jens Durand, MD  hydrALAZINE  (APRESOLINE ) 50 MG tablet Take 1 tablet (50 mg total) by mouth 3 (three) times daily. 01/28/24 02/27/24  Caleen Qualia, MD  insulin  NPH-regular Human (70-30) 100 UNIT/ML injection Inject 15 Units into the skin 2 (two) times daily with a meal. Patient taking differently: Inject 20 Units into the skin 2 (two) times daily with a meal. Pt stated he does not take it every day. 08/20/23   Jens Durand, MD  Insulin  Syringe-Needle U-100 S[place]RIA INSULIN  SYRINGE) 31G X 5/16 0.5 ML MISC use twice a day with humulin  70/30 Patient not taking: Reported on 08/23/2023 03/15/23   Trudy Anthony HERO, MD  metoprolol  succinate (TOPROL -XL) 100 MG  24 hr tablet Take 1 tablet (100 mg total) by mouth daily. Take with or immediately following a meal. 08/20/23 01/27/24  Jens Durand, MD  rosuvastatin  (CRESTOR ) 20 MG tablet Take 1 tablet (20 mg total) by mouth daily. 08/24/23 01/27/24  Donette Ellouise LABOR, FNP  sacubitril -valsartan  (ENTRESTO ) 49-51 MG Take 1 tablet by mouth 2 (two) times daily. 08/20/23 08/19/24  Jens Durand, MD  spironolactone  (ALDACTONE ) 25 MG tablet Take 1 tablet (25 mg total) by mouth daily. 08/20/23 09/19/23  Jens Durand, MD  tamsulosin   (FLOMAX ) 0.4 MG CAPS capsule Take 1 capsule (0.4 mg total) by mouth daily. Patient not taking: Reported on 01/27/2024 08/20/23 08/19/24  Jens Durand, MD   Physical Exam: Vitals:   05/15/24 0930 05/15/24 1000 05/15/24 1300 05/15/24 1454  BP: (!) 177/95 (!) 190/99 (!) 195/102   Pulse: 70 71 82   Resp: (!) 24 (!) 27 (!) 27   Temp:      TempSrc:      SpO2: 96% 96% 99% 100%  Weight:      Height:       Constitutional: appears age-appropriate, NAD, calm Eyes: PERRL, lids and conjunctivae normal ENMT: Mucous membranes are moist. Posterior pharynx clear of any exudate or lesions. Age-appropriate dentition. Hearing appropriate Neck: normal, supple, no masses, no thyromegaly Respiratory: Generalized decreased lung sounds bilaterally, no wheezing, no crackles. Normal respiratory effort.  Mild increased use accessory muscle use.  Cardiovascular: Regular rate and rhythm, no murmurs / rubs / gallops. No extremity edema. 2+ pedal pulses. No carotid bruits.  Abdomen: no tenderness, no masses palpated, no hepatosplenomegaly. Bowel sounds positive.  Musculoskeletal: no clubbing / cyanosis. No joint deformity upper and lower extremities. Good ROM, no contractures, no atrophy. Normal muscle tone.  Skin: no rashes, lesions, ulcers. No induration Neurologic: Sensation intact. Strength 5/5 in all 4.  Psychiatric: Normal judgment and insight. Alert and oriented x 3. Normal mood.   EKG: independently reviewed, showing sinus rhythm with rate of 83, QTc 437  Chest x-ray on Admission: I personally reviewed and I agree with radiologist reading as below.  DG Chest Port 1 View Result Date: 05/15/2024 CLINICAL DATA:  Chest pain EXAM: PORTABLE CHEST 1 VIEW COMPARISON:  01/27/2024 FINDINGS: Low volume film. The cardio pericardial silhouette is enlarged. There is pulmonary vascular congestion without overt pulmonary edema. Bibasilar atelectasis/infiltrate noted, right greater than left in new in the interval. Probable  small right pleural effusion. No acute bony abnormality. Telemetry leads overlie the chest. IMPRESSION: 1. Low volume film with bibasilar atelectasis/infiltrate, right greater than left. 2. Probable small right pleural effusion. Electronically Signed   By: Camellia Candle M.D.   On: 05/15/2024 08:22   Labs on Admission: I have personally reviewed following labs  CBC: Recent Labs  Lab 05/15/24 0806  WBC 7.5  HGB 10.0*  HCT 32.2*  MCV 97.9  PLT 303   Basic Metabolic Panel: Recent Labs  Lab 05/15/24 0806  NA 140  K 4.1  CL 105  CO2 23  GLUCOSE 125*  BUN 36*  CREATININE 2.10*  CALCIUM  8.7*   GFR: Estimated Creatinine Clearance: 37 mL/min (A) (by C-G formula based on SCr of 2.1 mg/dL (H)).  Urine analysis:    Component Value Date/Time   COLORURINE YELLOW (A) 01/27/2024 1606   APPEARANCEUR CLOUDY (A) 01/27/2024 1606   APPEARANCEUR Clear 05/08/2014 0846   LABSPEC 1.016 01/27/2024 1606   LABSPEC 1.035 05/08/2014 0846   PHURINE 5.0 01/27/2024 1606   GLUCOSEU >=500 (A) 01/27/2024 1606  GLUCOSEU >=500 05/08/2014 0846   HGBUR NEGATIVE 01/27/2024 1606   BILIRUBINUR NEGATIVE 01/27/2024 1606   BILIRUBINUR Negative 05/08/2014 0846   KETONESUR NEGATIVE 01/27/2024 1606   PROTEINUR 30 (A) 01/27/2024 1606   NITRITE NEGATIVE 01/27/2024 1606   LEUKOCYTESUR LARGE (A) 01/27/2024 1606   LEUKOCYTESUR Negative 05/08/2014 0846   This document was prepared using Dragon Voice Recognition software and may include unintentional dictation errors.  Dr. Sherre [facility]  If 7PM-7AM, please contact overnight-coverage provider If 7AM-7PM, please contact day attending provider [email or web address]  05/15/2024, 3:45 PM

## 2024-05-15 NOTE — ED Provider Notes (Signed)
 [facility] Provider Note    Event Date/Time   First MD Initiated Contact with Patient 05/15/24 [phone]     (approximate)   History   Shortness of breath   HPI  Chad Paget Sr. is a 63 y.o. male  who presents to the emergency department today because of concern for shortness of breath.  It has been coming on for about the past week.  Gradually getting worse.  He is worried that he might be fluid overloaded since it has happened to him in the past.  He has had some associated central chest pain.  Has been taking his fluid pills at home however feels like his urine output has not been as great as it should be.  He denies any fevers or chills.  Denies any known sick contacts.    Physical Exam   Triage Vital Signs: ED Triage Vitals  Encounter Vitals Group     BP 05/15/24 0753 (!) 200/98     Girls Systolic BP Percentile --      Girls Diastolic BP Percentile --      Boys Systolic BP Percentile --      Boys Diastolic BP Percentile --      Pulse Rate 05/15/24 0753 77     Resp 05/15/24 0753 19     Temp 05/15/24 0753 98.2 F (36.8 C)     Temp Source 05/15/24 0753 Oral     SpO2 05/15/24 0753 (!) 88 %     Weight 05/15/24 0751 185 lb (83.9 kg)     Height 05/15/24 0751 5' 6 (1.676 m)     Head Circumference --      Peak Flow --      Pain Score 05/15/24 0751 7     Pain Loc --      Pain Education --      Exclude from Growth Chart --     Most recent vital signs: Vitals:   05/15/24 0753  BP: (!) 200/98  Pulse: 77  Resp: 19  Temp: 98.2 F (36.8 C)  SpO2: (!) 88%   General: Awake, alert, oriented. CV:  Good peripheral perfusion. Regular rate and rhythm. Resp:  Normal effort. Lungs clear. Abd:  No distention.  Other:  Slight lower extremity edema.    ED Results / Procedures / Treatments   Labs (all labs ordered are listed, but only abnormal results are displayed) Labs Reviewed  BASIC METABOLIC PANEL WITH GFR - Abnormal; Notable for the  following components:      Result Value   Glucose, Bld 125 (*)    BUN 36 (*)    Creatinine, Ser 2.10 (*)    Calcium  8.7 (*)    GFR, Estimated 35 (*)    All other components within normal limits  CBC - Abnormal; Notable for the following components:   RBC 3.29 (*)    Hemoglobin 10.0 (*)    HCT 32.2 (*)    All other components within normal limits  BRAIN NATRIURETIC PEPTIDE - Abnormal; Notable for the following components:   B Natriuretic Peptide 1,075.9 (*)    All other components within normal limits  TROPONIN I (HIGH SENSITIVITY) - Abnormal; Notable for the following components:   Troponin I (High Sensitivity) 35 (*)    All other components within normal limits  PROCALCITONIN     EKG  I, Guadalupe Eagles, attending physician, personally viewed and interpreted this EKG  EKG Time: 0748 Rate: 83 Rhythm: normal sinus rhythm  Axis: normal Intervals: qtc 437 QRS: narrow, q waves v1, v2 ST changes: no st elevation Impression: abnormal ekg    RADIOLOGY I independently interpreted and visualized the CXR. My interpretation: Bilateral airspace opacities Radiology interpretation:  IMPRESSION:  1. Low volume film with bibasilar atelectasis/infiltrate, right  greater than left.  2. Probable small right pleural effusion.      PROCEDURES:  Critical Care performed: Yes  CRITICAL CARE Performed by: Guadalupe Eagles   Total critical care time: 30 minutes  Critical care time was exclusive of separately billable procedures and treating other patients.  Critical care was necessary to treat or prevent imminent or life-threatening deterioration.  Critical care was time spent personally by me on the following activities: development of treatment plan with patient and/or surrogate as well as nursing, discussions with consultants, evaluation of patient's response to treatment, examination of patient, obtaining history from patient or surrogate, ordering and performing treatments and  interventions, ordering and review of laboratory studies, ordering and review of radiographic studies, pulse oximetry and re-evaluation of patient's condition.   Procedures    MEDICATIONS ORDERED IN ED: Medications - No data to display   IMPRESSION / MDM / ASSESSMENT AND PLAN / ED COURSE  I reviewed the triage vital signs and the nursing notes.                              Differential diagnosis includes, but is not limited to, pneumonia, CHF, ACS, PE, viral illness  Patient's presentation is most consistent with acute presentation with potential threat to life or bodily function.   The patient is on the cardiac monitor to evaluate for evidence of arrhythmia and/or significant heart rate changes.  Patient presented to the emergency department today because of concerns for shortness of breath.  Patient thinks this could be secondary to fluid overload. Will check blood work, CXR. Patient was placed on 2L [place] and does feel improvement.  Patient's blood work is consistent with fluid overload.  Patient was given Lasix  and did start having increased output.  Clinically he stated he felt better however continued to desat off of oxygen.  Given continued oxygen requirement I do think patient would benefit from further workup and management.  Discussed plan of admission with the patient.      FINAL CLINICAL IMPRESSION(S) / ED DIAGNOSES   Final diagnoses:  Heart failure, unspecified HF chronicity, unspecified heart failure type [facility])     Note:  This document was prepared using Dragon voice recognition software and may include unintentional dictation errors.    Eagles Guadalupe, MD 05/15/24 [phone]

## 2024-05-15 NOTE — Assessment & Plan Note (Signed)
 With hypertensive urgency Hydralazine  5 mg IV every 4 hours as needed for SBP greater 170, 5 days ordered

## 2024-05-15 NOTE — ED Notes (Signed)
 PT placed on 2L [place] and taken to RM 10 by Burnard, RN.

## 2024-05-15 NOTE — ED Notes (Signed)
 Pt SpO2 decreased to 70s during ambulation trial.

## 2024-05-15 NOTE — ED Triage Notes (Signed)
 Patient to ED via POV for SOB and left sided CP. Started 4 days ago. State hx of CHF, HTN. NAD noted.

## 2024-05-15 NOTE — Assessment & Plan Note (Signed)
 Home insulin  70/30 equivalent, 15 units twice daily with meals and long-acting insulin  Lantus  equivalent 20 mg nightly have been resumed on admission Ozempic will not be continued during hospitalization Insulin  SSI with at bedtime coverage ordered Goal inpatient blood glucose level is 140-180

## 2024-05-15 NOTE — Assessment & Plan Note (Signed)
 -  As needed nicotine patch ordered ?

## 2024-05-15 NOTE — ED Notes (Signed)
 No sedation done, error in documentation.

## 2024-05-15 NOTE — Hospital Course (Addendum)
 [name] is a 63 year old male with history of insulin -dependent diabetes mellitus, hyperlipidemia, hypertension, BPH, GERD, overweight status, heart failure preserved ejection fraction, who presents ED for chief concerns of shortness of breath and left-sided chest pain.  Vitals in the ED showed t of 98.2, rr of 19, hr 77, blood pressure initially 200/98, SpO2 of 88% initially on room air and improved to 98% on 2 L nasal cannula.  Chest x-ray: Read as low volume with bibasilar atelectasis/infiltrate, right greater than left.  Probable small right pleural effusion.  ED treatment: Furosemide  60 mg IV one-time dose.  After furosemide  treatment by EDP, patient reports he feels significantly better however with ambulation, patient desatted to the 70s.

## 2024-05-15 NOTE — Assessment & Plan Note (Addendum)
 Secondary to increase fluid, oral intake, mainly Mountain Dew soda Strict I's and O's 01/28/2024: Echo read as estimate ejection fraction of 55 to 60%, grade 1 diastolic dysfunction Status post furosemide  60 mg per EDP, on admission I have ordered 20 mg one-time dose with supper 11/11: Furosemide  40 mg IV twice daily, 2 doses ordered

## 2024-05-15 NOTE — Assessment & Plan Note (Signed)
 Home rosuvastatin 20 mg nightly resumed

## 2024-05-16 ENCOUNTER — Other Ambulatory Visit: Payer: Self-pay

## 2024-05-16 DIAGNOSIS — I5023 Acute on chronic systolic (congestive) heart failure: Secondary | ICD-10-CM

## 2024-05-16 LAB — CBC
HCT: 28.4 % — ABNORMAL LOW (ref 39.0–52.0)
Hemoglobin: 9.2 g/dL — ABNORMAL LOW (ref 13.0–17.0)
MCH: 31.3 pg (ref 26.0–34.0)
MCHC: 32.4 g/dL (ref 30.0–36.0)
MCV: 96.6 fL (ref 80.0–100.0)
Platelets: 284 K/uL (ref 150–400)
RBC: 2.94 MIL/uL — ABNORMAL LOW (ref 4.22–5.81)
RDW: 13.9 % (ref 11.5–15.5)
WBC: 6.7 K/uL (ref 4.0–10.5)
nRBC: 0 % (ref 0.0–0.2)

## 2024-05-16 LAB — BASIC METABOLIC PANEL WITH GFR
Anion gap: 10 (ref 5–15)
BUN: 41 mg/dL — ABNORMAL HIGH (ref 8–23)
CO2: 26 mmol/L (ref 22–32)
Calcium: 8.3 mg/dL — ABNORMAL LOW (ref 8.9–10.3)
Chloride: 105 mmol/L (ref 98–111)
Creatinine, Ser: 2 mg/dL — ABNORMAL HIGH (ref 0.61–1.24)
GFR, Estimated: 37 mL/min — ABNORMAL LOW (ref >60)
Glucose, Bld: 78 mg/dL (ref 70–99)
Potassium: 3.8 mmol/L (ref 3.5–5.1)
Sodium: 141 mmol/L (ref 135–145)

## 2024-05-16 LAB — GLUCOSE, CAPILLARY
Glucose-Capillary: 53 mg/dL — ABNORMAL LOW (ref 70–99)
Glucose-Capillary: 109 mg/dL — ABNORMAL HIGH (ref 70–99)
Glucose-Capillary: 159 mg/dL — ABNORMAL HIGH (ref 70–99)

## 2024-05-16 MED ORDER — ONDANSETRON HCL 4 MG PO TABS
4.0000 mg | ORAL_TABLET | Freq: Four times a day (QID) | ORAL | 0 refills | Status: AC | PRN
  Filled 2024-05-16: qty 20, 5d supply, fill #0

## 2024-05-16 MED ORDER — AZITHROMYCIN 500 MG PO TABS
500.0000 mg | ORAL_TABLET | Freq: Every day | ORAL | 0 refills | Status: AC
  Filled 2024-05-16: qty 3, 3d supply, fill #0

## 2024-05-16 MED ORDER — DEXTROSE 50 % IV SOLN: 1.0000 | INTRAVENOUS | Status: DC | PRN

## 2024-05-16 MED ORDER — FUROSEMIDE 20 MG PO TABS
20.0000 mg | ORAL_TABLET | Freq: Every day | ORAL | 0 refills | Status: DC
  Filled 2024-05-16: qty 30, 30d supply, fill #0

## 2024-05-16 MED ORDER — CEFDINIR 300 MG PO CAPS
300.0000 mg | ORAL_CAPSULE | Freq: Two times a day (BID) | ORAL | 0 refills | Status: AC
  Filled 2024-05-16: qty 6, 3d supply, fill #0

## 2024-05-16 NOTE — TOC Transition Note (Signed)
 Transition of Care [facility]) - Discharge Note   Patient Details  Name: Chad Hubbert Sr. MRN: [id number] Date of Birth: December 29, 1960  [facility]) CM/SW Contact:  Lauraine JAYSON Carpen, LCSW Phone Number: 05/16/2024, 2:46 PM   Clinical Narrative:  Patient has orders to discharge home today. Readmission prevention screen complete. CSW met with patient. No family at bedside. CSW introduced role and explained that discharge planning would be discussed. PCP is at [facility] but he cannot remember her name. He drives himself to appointments. [facility]acy is [facility] [address]. No issues obtaining medications. Patient lives home alone. No home health or DME use prior to admission. No further concerns. His ride is here to pick him up. CSW signing off.  Patient Goals and CMS Choice            Discharge Placement                    Patient and family notified of of transfer: 05/16/24  Discharge Plan and Services Additional resources added to the After Visit Summary for                                       Social Drivers of Health (SDOH) Interventions SDOH Screenings   Food Insecurity: No Food Insecurity (05/15/2024)  Housing: Low Risk  (05/15/2024)  Transportation Needs: No Transportation Needs (05/15/2024)  Utilities: Not At Risk (05/15/2024)  Alcohol Screen: Low Risk  (08/18/2023)  Depression (PHQ2-9): Low Risk  (09/08/2021)  Financial Resource Strain: Low Risk  (08/18/2023)  Physical Activity: Inactive (08/18/2023)  Social Connections: Unknown (08/17/2023)  Stress: Stress Concern Present (12/09/2022)   Received from [facility]  Tobacco Use: High Risk (05/15/2024)  Health Literacy: Adequate Health Literacy (08/18/2023)     Readmission Risk Interventions    05/16/2024    2:45 PM 08/17/2023   11:43 AM  Readmission Risk Prevention Plan  Transportation Screening Complete Complete  PCP or Specialist Appt within 3-5 Days Complete  Complete  Social Work Consult for Recovery Care Planning/Counseling Complete Complete  Palliative Care Screening Not Applicable Not Applicable  Medication Review [facility]) Complete Complete

## 2024-05-16 NOTE — Inpatient Diabetes Management (Signed)
 Inpatient Diabetes Program Recommendations  AACE/ADA: New Consensus Statement on Inpatient Glycemic Control   Target Ranges:  Prepandial:   less than 140 mg/dL      Peak postprandial:   less than 180 mg/dL (1-2 hours)      Critically ill patients:  140 - 180 mg/dL    Latest Reference Range & Units 05/15/24 17:18 05/15/24 21:28 05/16/24 08:03 05/16/24 09:15  Glucose-Capillary 70 - 99 mg/dL 872 (H) 841 (H) 53 (L) 109 (H)   Review of Glycemic Control  Diabetes history: DM2 Outpatient Diabetes medications: Lantus  20 untis at bedtime, Ozempic 0.25 mg Qweek (Sunday), 70/30 15 units BID (not taking) Current orders for Inpatient glycemic control: Semglee  20 units at bedtime, Novolog  0-9 units TID with meals, Novolog  0-5 units QHS  Inpatient Diabetes Program Recommendations:    Insulin : CBG 53 mg/dl this morning (patient received Semglee  20 units last night). Please consider decreasing Semglee  to 10 units at bedtime.  Thanks, Earnie Gainer, RN, MSN, CDCES Diabetes Coordinator Inpatient Diabetes Program [phone] (Team Pager from 8am to 5pm)

## 2024-05-16 NOTE — Discharge Summary (Signed)
 Physician Discharge Summary   Patient: Chad Good. MRN: [id number] DOB: 02-28-1961  Admit date:     05/15/2024  Discharge date: 05/16/24  Discharge Physician: Dr. Sherre   PCP: [facility]   Recommendations at discharge:   Furosemide  20 mg daily initiated starting on 11/12, 30-day supply. Patient will need to follow-up with PCP to monitor renal function and referral to nephrology for maintenance follow-up as appropriate. Stop drinking [facility]. Drink up to 60 ounces of water per day.  Discharge Diagnoses: Principal Problem:   Acute exacerbation of CHF (congestive heart failure) (HCC) Active Problems:   Acute hypoxic respiratory failure (HCC)   Insulin  dependent type 2 diabetes mellitus (HCC)   Dyslipidemia   HTN (hypertension)   Tobacco dependence   DNR (do not resuscitate)  Resolved Problems:   * No resolved hospital problems. [facility] Course:  Chad Good is a 63 year old male with history of insulin -dependent diabetes mellitus, hyperlipidemia, hypertension, BPH, GERD, overweight status, heart failure preserved ejection fraction, who presents ED for chief concerns of shortness of breath and left-sided chest pain.  Vitals in the ED showed t of 98.2, rr of 19, hr 77, blood pressure initially 200/98, SpO2 of 88% initially on room air and improved to 98% on 2 L nasal cannula.  Chest x-ray: Read as low volume with bibasilar atelectasis/infiltrate, right greater than left.  Probable small right pleural effusion.  ED treatment: Furosemide  60 mg IV one-time dose.  After furosemide  treatment by EDP, patient reports he feels significantly better however with ambulation, patient desatted to the 70s.  Assessment and Plan:  * Acute exacerbation of CHF (congestive heart failure) (HCC) Secondary to increase fluid, oral intake, mainly Mountain Dew soda Strict I's and O's 01/28/2024: Echo read as estimate ejection fraction of 55 to 60%, grade 1 diastolic  dysfunction Status post furosemide  60 mg per EDP, on admission I have ordered 20 mg one-time dose with supper 11/11: Furosemide  40 mg IV twice daily, 2 doses ordered  Acute hypoxic respiratory failure (HCC) Patient desatted to 70s on room air Per chest x-ray read, likely secondary to heart failure however I do see infiltrate in the right lower lobe Ceftriaxone  2 g IV daily, azithromycin 500 mg IV daily initiated for community-acquired pneumonia Incentive spirometry, flutter valve Continue oxygen supplementation to maintain SpO2 greater than 92% Continuous pulse oximetry  Dyslipidemia Home rosuvastatin  20 mg nightly resumed  Insulin  dependent type 2 diabetes mellitus (HCC) Home insulin  70/30 equivalent, 15 units twice daily with meals and long-acting insulin  Lantus  equivalent 20 mg nightly have been resumed on admission Ozempic will not be continued during hospitalization Insulin  SSI with at bedtime coverage ordered Goal inpatient blood glucose level is 140-180  Tobacco dependence As needed nicotine  patch ordered  HTN (hypertension) With hypertensive urgency Hydralazine  5 mg IV every 4 hours as needed for SBP greater 170, 5 days ordered  Pain control - Onward  Controlled Substance Reporting System database was reviewed. and patient was instructed, not to drive, operate heavy machinery, perform activities at heights, swimming or participation in water activities or provide baby-sitting services while on Pain, Sleep and Anxiety Medications; until their outpatient Physician has advised to do so again. Also recommended to not to take more than prescribed Pain, Sleep and Anxiety Medications.  Consultants: None Procedures performed: None indicated Disposition: Home Diet recommendation:  Discharge Diet Orders (From admission, onward)     Start     Ordered   05/16/24 0000  Diet -  low sodium heart healthy        05/16/24 1353           Cardiac and Carb modified  diet DISCHARGE MEDICATION: Allergies as of 05/16/2024   No Known Allergies      Medication List     PAUSE taking these medications    Entresto  49-51 MG Wait to take this until your doctor or other care provider tells you to start again. Generic drug: sacubitril -valsartan  Take 1 tablet by mouth 2 (two) times daily.   furosemide  20 MG tablet Wait to take this until your doctor or other care provider tells you to start again. Commonly known as: LASIX  Take 3 tablets (60 mg total) by mouth daily. You also have another medication with the same name that you may need to continue taking. What changed: how much to take   spironolactone  25 MG tablet Wait to take this until your doctor or other care provider tells you to start again. Commonly known as: ALDACTONE  Take 1 tablet (25 mg total) by mouth daily.       STOP taking these medications    cephALEXin 500 MG capsule Commonly known as: KEFLEX   FeroSul 325 (65 Fe) MG tablet Generic drug: ferrous sulfate    hydrALAZINE  50 MG tablet Commonly known as: APRESOLINE    tamsulosin  0.4 MG Caps capsule Commonly known as: FLOMAX    UltiCare Insulin  Syringe 31G X 5/16 0.5 ML Misc Generic drug: Insulin  Syringe-Needle U-100       TAKE these medications    aspirin  EC 81 MG tablet Take 1 tablet by mouth daily.   azithromycin 500 MG tablet Commonly known as: Zithromax Take 1 tablet (500 mg total) by mouth daily for 3 days. Start taking on: May 17, 2024   cefdinir 300 MG capsule Commonly known as: OMNICEF Take 1 capsule (300 mg total) by mouth 2 (two) times daily for 3 days. Start taking on: May 17, 2024   dapagliflozin  propanediol 10 MG Tabs tablet Commonly known as: FARXIGA  Take 1 tablet by mouth daily.   esomeprazole  40 MG capsule Commonly known as: NexIUM  Take 1 capsule (40 mg total) by mouth daily.   furosemide  20 MG tablet Commonly known as: Lasix  Take 1 tablet (20 mg total) by mouth daily. Start  taking on: May 17, 2024 What changed: Another medication with the same name was paused. Ask your nurse or doctor if you should take this medication.   insulin  NPH-regular Human (70-30) 100 UNIT/ML injection Inject 15 Units into the skin 2 (two) times daily with a meal.   Lantus  SoloStar 100 UNIT/ML Solostar Pen Generic drug: insulin  glargine Inject 20 Units into the skin at bedtime.   metoprolol  succinate 100 MG 24 hr tablet Commonly known as: TOPROL -XL Take 1 tablet (100 mg total) by mouth daily. Take with or immediately following a meal.   ondansetron  4 MG tablet Commonly known as: ZOFRAN  Take 1 tablet (4 mg total) by mouth every 6 (six) hours as needed for nausea.   Ozempic (0.25 or 0.5 MG/DOSE) 2 MG/3ML Sopn Generic drug: Semaglutide(0.25 or 0.5MG /DOS) Inject 0.25 mg into the skin once a week. On sunday   rosuvastatin  20 MG tablet Commonly known as: CRESTOR  Take 1 tablet (20 mg total) by mouth daily.       Discharge Exam: Filed Weights   05/15/24 0751 05/16/24 0500  Weight: 83.9 kg 80.4 kg   Physical Exam Constitutional:      Appearance: He is well-developed.  HENT:  Head: Normocephalic and atraumatic.  Eyes:     Pupils: Pupils are equal, round, and reactive to light.  Cardiovascular:     Rate and Rhythm: Normal rate and regular rhythm.     Heart sounds: Normal heart sounds. Heart sounds not distant. No murmur heard. Pulmonary:     Effort: Pulmonary effort is normal. No respiratory distress.     Breath sounds: Normal breath sounds. No decreased breath sounds, wheezing or rhonchi.  Chest:     Chest wall: No mass, deformity or tenderness.  Abdominal:     General: Bowel sounds are normal. There is no abdominal bruit.     Palpations: Abdomen is soft.  Musculoskeletal:        General: Normal range of motion.     Cervical back: Normal range of motion and neck supple.     Right lower leg: No edema.     Left lower leg: No edema.  Skin:    General: Skin is  warm and dry.     Capillary Refill: Capillary refill takes less than 2 seconds.  Neurological:     General: No focal deficit present.     Mental Status: He is alert. He is disoriented.  Psychiatric:        Mood and Affect: Mood normal. Mood is not anxious.   Condition at discharge: good  The results of significant diagnostics from this hospitalization (including imaging, microbiology, ancillary and laboratory) are listed below for reference.   Imaging Studies: DG Chest Port 1 View Result Date: 05/15/2024 CLINICAL DATA:  Chest pain EXAM: PORTABLE CHEST 1 VIEW COMPARISON:  01/27/2024 FINDINGS: Low volume film. The cardio pericardial silhouette is enlarged. There is pulmonary vascular congestion without overt pulmonary edema. Bibasilar atelectasis/infiltrate noted, right greater than left in new in the interval. Probable small right pleural effusion. No acute bony abnormality. Telemetry leads overlie the chest. IMPRESSION: 1. Low volume film with bibasilar atelectasis/infiltrate, right greater than left. 2. Probable small right pleural effusion. Electronically Signed   By: Camellia Candle M.D.   On: 05/15/2024 08:22   Microbiology: Results for orders placed or performed during the hospital encounter of 01/27/24  Culture, blood (routine x 2)     Status: None   Collection Time: 01/27/24  4:08 PM   Specimen: BLOOD LEFT ARM  Result Value Ref Range Status   Specimen Description BLOOD LEFT ARM  Final   Special Requests   Final    BOTTLES DRAWN AEROBIC AND ANAEROBIC Blood Culture results may not be optimal due to an inadequate volume of blood received in culture bottles   Culture   Final    NO GROWTH 5 DAYS Performed at [facility], [address] [place], [place] [address]    Report Status 02/01/2024 FINAL  Final  Culture, blood (routine x 2)     Status: None   Collection Time: 01/27/24  4:08 PM   Specimen: BLOOD RIGHT ARM  Result Value Ref Range Status   Specimen Description  BLOOD RIGHT ARM  Final   Special Requests   Final    BOTTLES DRAWN AEROBIC AND ANAEROBIC Blood Culture adequate volume   Culture   Final    NO GROWTH 5 DAYS Performed at [facility], [address], [place], [place] [address]    Report Status 02/01/2024 FINAL  Final  Urine Culture (for pregnant, neutropenic or urologic patients or patients with an indwelling urinary catheter)     Status: Abnormal   Collection Time: 01/27/24  4:08 PM   Specimen: Urine, Clean Catch  Result Value Ref Range Status   Specimen Description   Final    URINE, CLEAN CATCH Performed at [facility], [address] [place], [place] [address]    Special Requests   Final    NONE Performed at [facility], [address] [place], [place] [address]    Culture >=100,000 COLONIES/mL YEAST (A)  Final   Report Status 01/29/2024 FINAL  Final   Labs: CBC: Recent Labs  Lab 05/15/24 0806 05/16/24 0420  WBC 7.5 6.7  HGB 10.0* 9.2*  HCT 32.2* 28.4*  MCV 97.9 96.6  PLT 303 284   Basic Metabolic Panel: Recent Labs  Lab 05/15/24 0806 05/16/24 0420  NA 140 141  K 4.1 3.8  CL 105 105  CO2 23 26  GLUCOSE 125* 78  BUN 36* 41*  CREATININE 2.10* 2.00*  CALCIUM  8.7* 8.3*   CBG: Recent Labs  Lab 05/15/24 1718 05/15/24 2128 05/16/24 0803 05/16/24 0915 05/16/24 1204  GLUCAP 127* 158* 53* 109* 159*   Discharge time spent: greater than 30 minutes.  Signed: Dr. Sherre [facility] 05/16/2024

## 2024-05-18 ENCOUNTER — Telehealth: Payer: Self-pay

## 2024-05-26 ENCOUNTER — Ambulatory Visit: Admitting: Family

## 2024-06-13 ENCOUNTER — Telehealth: Payer: Self-pay | Admitting: Family

## 2024-06-13 NOTE — Progress Notes (Unsigned)
 Advanced [facility] Failure Clinic Note   Referring Physician: recent admission PCP: [facility] (last seen 12/24) Cardiologist: [facility] (last seen 12/24)  Chief Complaint: fatigue  HPI:  [name] is a 63 y/o male with a history of refractory HTN, HLD, IDDM with insulin  resistance, PAD, CKD stage III, tobacco use and chronic HFpEF. Previously followed with Dr. Florencio [facility], but recently transferred cardiac care to [facility], which may account for lapse in medication fills. Reports being diagnosed with CHF initially after receiving the COVID vaccination in 2021.   Pertinent cardiac history: Stress test in 08/2020 noted LVEF of 40-45% and inferior wall hypokinesis associated with moderate perfusion abnormality. Cardiac MRI in 10/2022 noted diffuse patchy hyperenhancement of the basal septal and lateral walls, a pattern of scarring non-ischemic in nature and seen more in patients with history of uncontrolled hypertension, though HCM could not be excluded. The CMR findings probability for cardiac amyloidosis was low. TTE 03/13/23 showed LVEF of 50-55% with GIIIDD.  Admitted 08/16/23 due to worsening shortness of breath and pedal edema after running out of lasix  for ~ 1 week. Placed on oxygen due to RA sat of 87%. CXR showed pulmonary congestion. IV diuresed. Renal function worsened so diuretics held and IVF given.   He presents today for his initial HF visit with a chief complaint of minimal fatigue. Chronic in nature and says that his energy level is much improved. Denies shortness of breath, chest pain, cough, palpitations, abdominal distention, pedal edema, dizziness or weight gain. Reports sleeping well on 2 pillows.   Did not take medications yet today and it's almost 3pm. 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 Does intend to get both doses of  medications in today. Hydralazine  is listed as TID PRN for SBP >150 but he says that he's been taking it once daily and that his SBP doesn't get below 140.   Does not add salt to his food. Weighing daily. Drinks 20 oz Mtn Dew, 32 oz water, 20 oz gatorade daily. Understands that he needs to keep his daily fluid intake to 60-64 ounces and little gatorade due to sodium.    Review of Systems: [y] = yes, [ ]  = no   General: Weight gain [ ] ; Weight loss [ ] ; Anorexia [ ] ; Fatigue davis.dad ]; Fever [ ] ; Chills [ ] ; Weakness [ ]   Cardiac: Chest pain/pressure [ ] ; Resting SOB [ ] ; Exertional SOB [ ] ; Orthopnea [ ] ; Pedal Edema [ ] ; Palpitations [ ] ; Syncope [ ] ; Presyncope [ ] ; Paroxysmal nocturnal dyspnea[ ]   Pulmonary: Cough [ ] ; Wheezing[ ] ; Hemoptysis[ ] ; Sputum [ ] ; Snoring [ ]   GI: Vomiting[ ] ; Dysphagia[ ] ; Melena[ ] ; Hematochezia [ ] ; Heartburn[ ] ; Abdominal pain [ ] ; Constipation [ ] ; Diarrhea [ ] ; BRBPR [ ]   GU: Hematuria[ ] ; Dysuria [ ] ; Nocturia[ ]   Vascular: Pain in legs with walking [ ] ; Pain in feet with lying flat [ ] ; Non-healing sores [ ] ; Stroke [ ] ; TIA [ ] ; Slurred speech [ ] ;  Neuro: Headaches[ ] ; Vertigo[ ] ; Seizures[ ] ; Paresthesias[ ] ;Blurred vision [ ] ; Diplopia [ ] ; Vision changes [ ]   Ortho/Skin: Arthritis [ ] ; Joint pain [ ] ; Muscle pain [ ] ; Joint swelling [ ] ; Back Pain [ ] ; Rash [ ]   Psych: Depression[ ] ; Anxiety[ ]   Heme:  Bleeding problems [ ] ; Clotting disorders [ ] ; Anemia [ ]   Endocrine: Diabetes davis.dad ]; Thyroid dysfunction[ ]    Past Medical History:  Diagnosis Date   Diabetes mellitus without complication (HCC)    High cholesterol    Hypertension    Stomach ulcer     Current Outpatient Medications  Medication Sig Dispense Refill   aspirin  EC 81 MG tablet Take 1 tablet by mouth daily.     esomeprazole  (NEXIUM ) 40 MG capsule Take 1 capsule (40 mg total) by mouth daily. (Patient not taking: Reported on 05/15/2024) 30 capsule 1   [Paused] furosemide  (LASIX ) 20 MG  tablet Take 3 tablets (60 mg total) by mouth daily. (Patient taking differently: Take 20 mg by mouth daily.) 90 tablet 0   furosemide  (LASIX ) 20 MG tablet Take 1 tablet (20 mg total) by mouth daily. 30 tablet 0   insulin  NPH-regular Human (70-30) 100 UNIT/ML injection Inject 15 Units into the skin 2 (two) times daily with a meal. (Patient not taking: Reported on 05/15/2024) 10 mL 0   LANTUS  SOLOSTAR 100 UNIT/ML Solostar Pen Inject 20 Units into the skin at bedtime.     metoprolol  succinate (TOPROL -XL) 100 MG 24 hr tablet Take 1 tablet (100 mg total) by mouth daily. Take with or immediately following a meal. 30 tablet 0   ondansetron  (ZOFRAN ) 4 MG tablet Take 1 tablet (4 mg total) by mouth every 6 (six) hours as needed for nausea. 20 tablet 0   OZEMPIC, 0.25 OR 0.5 MG/DOSE, 2 MG/3ML SOPN Inject 0.25 mg into the skin once a week. On sunday     rosuvastatin  (CRESTOR ) 20 MG tablet Take 1 tablet (20 mg total) by mouth daily. 90 tablet 3   [Paused] sacubitril -valsartan  (ENTRESTO ) 49-51 MG Take 1 tablet by mouth 2 (two) times daily. 60 tablet 0   [Paused] spironolactone  (ALDACTONE ) 25 MG tablet Take 1 tablet (25 mg total) by mouth daily. 30 tablet 0   No current facility-administered medications for this visit.    No Known Allergies    Social History   Socioeconomic History   Marital status: Single    Spouse name: Not on file   Number of children: 7   Years of education: Not on file   Highest education level: 12th grade  Occupational History   Occupation: group home  Tobacco Use   Smoking status: Every Day    Types: Cigars    Start date: 07/07/1995   Smokeless tobacco: Never   Tobacco comments:    2-3 cigars/day  Vaping Use   Vaping status: Never Used  Substance and Sexual Activity   Alcohol use: No   Drug use: No   Sexual activity: Yes  Other Topics Concern   Not on file  Social History Narrative   Not on file   Social Drivers of Health   Financial Resource Strain: Low Risk   (08/18/2023)   Overall Financial Resource Strain (CARDIA)    Difficulty of Paying Living Expenses: Not hard at all  Food Insecurity: No Food Insecurity (05/15/2024)   Hunger Vital Sign    Worried About Running Out of Food in the Last Year: Never true    Ran Out of Food in the Last Year: Never true  Transportation Needs: No Transportation Needs (05/15/2024)   PRAPARE - Administrator, Civil Service (Medical): No    Lack of Transportation (Non-Medical): No  Physical Activity: Inactive (08/18/2023)   Exercise Vital Sign    Days of Exercise  per Week: 0 days    Minutes of Exercise per Session: 0 min  Stress: Stress Concern Present (12/09/2022)   Received from [facility] Occupational Health - Occupational Stress Questionnaire    Feeling of Stress : To some extent  Social Connections: Unknown (08/17/2023)   Social Connection and Isolation Panel    Frequency of Communication with Friends and Family: More than three times a week    Frequency of Social Gatherings with Friends and Family: More than three times a week    Attends Religious Services: Never    Database Administrator or Organizations: No    Attends Banker Meetings: Never    Marital Status: Patient declined  Catering Manager Violence: Not At Risk (05/15/2024)   Humiliation, Afraid, Rape, and Kick questionnaire    Fear of Current or Ex-Partner: No    Emotionally Abused: No    Physically Abused: No    Sexually Abused: No      Family History  Problem Relation Age of Onset   Diabetes Father     There were no vitals filed for this visit.  Wt Readings from Last 3 Encounters:  05/16/24 177 lb 4 oz (80.4 kg)  04/02/24 169 lb 12.1 oz (77 kg)  01/27/24 170 lb (77.1 kg)   Lab Results  Component Value Date   CREATININE 2.00 (H) 05/16/2024   CREATININE 2.10 (H) 05/15/2024   CREATININE 2.48 (H) 01/28/2024   PHYSICAL EXAM:  General: Well appearing. No resp  difficulty HEENT: normal Neck: supple, no JVD Cor: Regular rhythm, rate. No rubs, gallops or murmurs Lungs: clear Abdomen: soft, nontender, nondistended. Extremities: no cyanosis, clubbing, rash, trace pitting edema around bilateral shins Neuro: alert & oriented X 3. Moves all 4 extremities w/o difficulty. Affect pleasant   ECG: not done   ASSESSMENT & PLAN:  1: NICM with preserved ejection fraction- - suspect due to uncontrolled HTN - NYHA class II - euvolemic - weighing daily; reviewed the importance of calling for overnight weight gain of > 2 pounds or weekly weight gain of > 5 pounds - Cardiac MRI in 10/2022 noted diffuse patchy hyperenhancement of the basal septal and lateral walls, a pattern of scarring non-ischemic in nature and seen more in patients with history of uncontrolled hypertension, though HCM could not be excluded. The CMR findings probability for cardiac amyloidosis was low. - TTE 03/13/23 showed LVEF of 50-55% with GIII DD. - daughter also has HF so may benefit from genetic testing - continue farxiga  10mg  daily - continue furosemide  60mg  daily - continue metoprolol  succinate 100mg  daily - continue entresto  49/51mg  BID - continue spironolactone  25mg  daily - stop isosorbide  as nitrates can worsen functional capacity in HFpEF & he doesn't have any chest pain - BMET today - BNP 08/16/23 reviewed and was 1149.0  2: HTN- - BP 180/81 but he hasn't taken any of his medications yet today - emphasized taking them at consistent times and that he doesn't need to eat a full meal when taking the medications - saw [facility] PCP 12/24 - increase hydralazine  to 50mg  TID and not PRN; he has been taking it once daily - BP log given and instructed to bring to each visit - BMET 08/20/23 reviewed and showed sodium 139, potassium 4.2, creatinine 1.71 & GFR 45 - BMET today  3: IDDM- - A1c 08/18/23 reviewed and was 6.3%  4: Hyperlipidemia- - LDL 07/17/20 reviewed and was 39 - saw [facility]  cardiology  12/24 - continue rosuvastatin  5mg  daily - lipid panel today  5: Tobacco use-  - has not smoked since discharge 3 days ago - continued cessation discussed   Return in 1 month, sooner if needed.   Ellouise DELENA Class, FNP 06/13/24

## 2024-06-13 NOTE — Telephone Encounter (Signed)
 Called to confirm/remind patient of their appointment at the Advanced [facility] Failure Clinic on 06/14/24.   Appointment:   [x] Confirmed  [] Left mess   [] No answer/No voice mail  [] VM Full/unable to leave message  [] Phone not in service  Patient reminded to bring all medications and/or complete list.  Confirmed patient has transportation. Gave directions, instructed to utilize valet parking.

## 2024-06-14 ENCOUNTER — Encounter: Payer: Self-pay | Admitting: Family

## 2024-06-14 ENCOUNTER — Other Ambulatory Visit
Admission: RE | Admit: 2024-06-14 | Discharge: 2024-06-14 | Disposition: A | Discharge status: Home / Self Care | Source: Ambulatory Visit | Attending: Family | Admitting: Family

## 2024-06-14 ENCOUNTER — Ambulatory Visit: Payer: Self-pay | Admitting: Family

## 2024-06-14 ENCOUNTER — Ambulatory Visit (HOSPITAL_BASED_OUTPATIENT_CLINIC_OR_DEPARTMENT_OTHER): Admitting: Family

## 2024-06-14 VITALS — BP 118/71 | HR 59 | Wt 166.6 lb

## 2024-06-14 DIAGNOSIS — Z794 Long term (current) use of insulin: Secondary | ICD-10-CM

## 2024-06-14 DIAGNOSIS — I5032 Chronic diastolic (congestive) heart failure: Secondary | ICD-10-CM | POA: Diagnosis present

## 2024-06-14 DIAGNOSIS — I10 Essential (primary) hypertension: Secondary | ICD-10-CM

## 2024-06-14 DIAGNOSIS — E1122 Type 2 diabetes mellitus with diabetic chronic kidney disease: Secondary | ICD-10-CM

## 2024-06-14 DIAGNOSIS — F17200 Nicotine dependence, unspecified, uncomplicated: Secondary | ICD-10-CM | POA: Diagnosis not present

## 2024-06-14 DIAGNOSIS — E785 Hyperlipidemia, unspecified: Secondary | ICD-10-CM

## 2024-06-14 DIAGNOSIS — N1831 Chronic kidney disease, stage 3a: Secondary | ICD-10-CM | POA: Diagnosis not present

## 2024-06-14 LAB — BASIC METABOLIC PANEL WITH GFR
Anion gap: 10 (ref 5–15)
BUN: 34 mg/dL — ABNORMAL HIGH (ref 8–23)
CO2: 31 mmol/L (ref 22–32)
Calcium: 9.3 mg/dL (ref 8.9–10.3)
Chloride: 100 mmol/L (ref 98–111)
Creatinine, Ser: 2.24 mg/dL — ABNORMAL HIGH (ref 0.61–1.24)
GFR, Estimated: 32 mL/min — ABNORMAL LOW (ref >60)
Glucose, Bld: 182 mg/dL — ABNORMAL HIGH (ref 70–99)
Potassium: 3.9 mmol/L (ref 3.5–5.1)
Sodium: 141 mmol/L (ref 135–145)

## 2024-06-14 LAB — LIPID PANEL
Cholesterol: 232 mg/dL — ABNORMAL HIGH (ref 0–200)
HDL: 32 mg/dL — ABNORMAL LOW (ref >40)
LDL Cholesterol: UNDETERMINED mg/dL (ref 0–99)
Total CHOL/HDL Ratio: 7.2 ratio
Triglycerides: 409 mg/dL — ABNORMAL HIGH (ref <150)
VLDL: 82 mg/dL — ABNORMAL HIGH (ref 0–40)

## 2024-06-14 LAB — PRO BRAIN NATRIURETIC PEPTIDE: Pro Brain Natriuretic Peptide: 2041 pg/mL — ABNORMAL HIGH (ref <300.0)

## 2024-06-14 NOTE — Patient Instructions (Signed)
 Medication Changes:  INCREASE Entresto  to 1 tab TWO TIMES daily  Lab Work:  Go over to the MEDICAL MALL. Go pass the gift shop and have your blood work completed.  We will only call you if the results are abnormal or if the provider would like to make medication changes.  No news is good news.     Testing/Procedures:  Your physician has requested that you have an echocardiogram. Echocardiography is a painless test that uses sound waves to create images of your heart. It provides your doctor with information about the size and shape of your heart and how well your hearts chambers and valves are working. This procedure takes approximately one hour. There are no restrictions for this procedure. Please do NOT wear cologne, perfume, aftershave, or lotions (deodorant is allowed). Please arrive 15 minutes prior to your appointment time.  Please note: We ask at that you not bring children with you during ultrasound (echo/ vascular) testing. Due to room size and safety concerns, children are not allowed in the ultrasound rooms during exams. Our front office staff cannot provide observation of children in our lobby area while testing is being conducted. An adult accompanying a patient to their appointment will only be allowed in the ultrasound room at the discretion of the ultrasound technician under special circumstances. We apologize for any inconvenience.  SOMEONE WILL CONTACT YOU IN ORDER TO SCHEDULE YOUR APPOINTMENT.   Follow-Up in: Please follow up with the Advanced Heart Failure Clinic in 1 month with Ellouise Class, FNP.    Thank you for choosing [facility].    At the Advanced Heart Failure Clinic, you and your health needs are our priority. We have a designated team specialized in the treatment of Heart Failure. This Care Team includes your primary Heart Failure Specialized Cardiologist (physician), Advanced Practice Providers (APPs- Physician  Assistants and Nurse Practitioners), and Pharmacist who all work together to provide you with the care you need, when you need it.   You may see any of the following providers on your designated Care Team at your next follow up:  Dr. Toribio Fuel Dr. Ezra Shuck Dr. Ria Commander Dr. Morene Brownie Ellouise Class, FNP Jaun Bash, RPH-CPP  Please be sure to bring in all your medications bottles to every appointment.   Need to Contact Us :  If you have any questions or concerns before your next appointment please send us  a message through [facility] or call our office at [phone].    TO LEAVE A MESSAGE FOR THE NURSE SELECT OPTION 2, PLEASE LEAVE A MESSAGE INCLUDING: YOUR NAME DATE OF BIRTH CALL BACK NUMBER REASON FOR CALL**this is important as we prioritize the call backs  YOU WILL RECEIVE A CALL BACK THE SAME DAY AS LONG AS YOU CALL BEFORE 4:00 PM

## 2024-06-14 NOTE — Progress Notes (Signed)
 TTR genetic testing collected via [facility] per Ellouise Class, FNP.  Order form completed, signed and shipped with sample by FedEx to [facility].

## 2024-06-15 LAB — LDL CHOLESTEROL, DIRECT: Direct LDL: 117 mg/dL — ABNORMAL HIGH (ref 0–99)

## 2024-06-16 MED ORDER — FUROSEMIDE 20 MG PO TABS: 20.0000 mg | ORAL_TABLET | Freq: Every day | ORAL | 3 refills | Status: AC

## 2024-06-16 MED ORDER — ROSUVASTATIN CALCIUM 20 MG PO TABS: 20.0000 mg | ORAL_TABLET | Freq: Every day | ORAL | 3 refills | Status: AC

## 2024-06-16 NOTE — Telephone Encounter (Signed)
 Spoke to pt about med changes. Pt agreeable to med changes and to labs in about 2 weeks at the medical mall. Reviewed location and time with pt. Orders placed. No further questions at this time.

## 2024-07-18 ENCOUNTER — Telehealth: Payer: Self-pay | Admitting: Family

## 2024-07-18 NOTE — Progress Notes (Unsigned)
 "  Advanced Heart Failure Clinic Note   Referring Physician: recent admission [facility]: [facility] ([facility] seen 12/24) Cardiologist: [facility] (last seen 12/25)  Chief Complaint:    HPI:  Chad Good is a 64 y/o male with a history of refractory HTN, HLD, IDDM with insulin  resistance, PAD, CKD stage III, tobacco use and chronic HFpEF. Previously followed with Dr. Florencio with [facility], but recently transferred cardiac care to [facility], which may account for lapse in medication fills. Reports being diagnosed with CHF initially after receiving the COVID vaccination in 2021.   Pertinent cardiac history: Stress test in 08/2020 noted LVEF of 40-45% and inferior wall hypokinesis associated with moderate perfusion abnormality. Cardiac MRI in 10/2022 noted diffuse patchy hyperenhancement of the basal septal and lateral walls, a pattern of scarring non-ischemic in nature and seen more in patients with history of uncontrolled hypertension, though HCM could not be excluded. The CMR findings probability for cardiac amyloidosis was low. TTE 03/13/23 showed LVEF of 50-55% with GIIIDD.  Admitted 08/16/23 due to worsening shortness of breath and pedal edema after running out of lasix  for ~ 1 week. Placed on oxygen due to RA sat of 87%. CXR showed pulmonary congestion. IV diuresed. Renal function worsened so diuretics held and IVF given.   Admitted 05/15/24 with shortness of breath and left-sided chest pain. Chest x-ray: Read as low volume with bibasilar atelectasis/infiltrate, right greater than left. Probable small right pleural effusion. Blood pressure initially 200/98, SpO2 of 88% initially on room air and improved to 98% on 2 L nasal cannula. IV diuresed with improvement of symptoms. Antibiotics given for pneumonia. IV hydralazine  given for HTN urgency.   Seen in [facility] 06/14/24 where entresto  was increased to BID (he was taking daily).   He presents today for a HF follow-up visit with a chief complaint of    ROS: All systems  negative except what is listed in HPI, PMH and Problem List   Past Medical History:  Diagnosis Date   Diabetes mellitus without complication (HCC)    High cholesterol    Hypertension    Stomach ulcer     Current Outpatient Medications  Medication Sig Dispense Refill   aspirin  EC 81 MG tablet Take 1 tablet by mouth daily.     dapagliflozin  propanediol (FARXIGA ) 10 MG TABS tablet Take 10 mg by mouth daily.     esomeprazole  (NEXIUM ) 40 MG capsule Take 1 capsule (40 mg total) by mouth daily. 30 capsule 1   furosemide  (LASIX ) 20 MG tablet Take 1 tablet (20 mg total) by mouth daily. 30 tablet 3   insulin  NPH-regular Human (70-30) 100 UNIT/ML injection Inject 15 Units into the skin 2 (two) times daily with a meal. 10 mL 0   LANTUS  SOLOSTAR 100 UNIT/ML Solostar Pen Inject 20 Units into the skin at bedtime.     metoprolol  succinate (TOPROL -XL) 100 MG 24 hr tablet Take 1 tablet (100 mg total) by mouth daily. Take with or immediately following a meal. 30 tablet 0   ondansetron  (ZOFRAN ) 4 MG tablet Take 1 tablet (4 mg total) by mouth every 6 (six) hours as needed for nausea. 20 tablet 0   OZEMPIC, 0.25 OR 0.5 MG/DOSE, 2 MG/3ML SOPN Inject 0.25 mg into the skin once a week. On sunday     rosuvastatin  (CRESTOR ) 20 MG tablet Take 1 tablet (20 mg total) by mouth daily. 90 tablet 3   sacubitril -valsartan  (ENTRESTO ) 49-51 MG Take 1 tablet by mouth 2 (two) times daily. 60 tablet 0   [  Paused] spironolactone  (ALDACTONE ) 25 MG tablet Take 1 tablet (25 mg total) by mouth daily. (Patient not taking: Reported on 06/14/2024) 30 tablet 0   No current facility-administered medications for this visit.    No Known Allergies    Social History   Socioeconomic History   Marital status: Single    Spouse name: Not on file   Number of children: 7   Years of education: Not on file   Highest education level: 12th grade  Occupational History   Occupation: group home  Tobacco Use   Smoking status: Every Day     Types: Cigars    Start date: 07/07/1995   Smokeless tobacco: Never   Tobacco comments:    2-3 cigars/day  Vaping Use   Vaping status: Never Used  Substance and Sexual Activity   Alcohol use: No   Drug use: No   Sexual activity: Yes  Other Topics Concern   Not on file  Social History Narrative   Not on file   Social Drivers of Health   Tobacco Use: Medium Risk (06/26/2024)   Received from [facility]   Patient History    Smoking Tobacco Use: Former    Smokeless Tobacco Use: Never    Passive Exposure: Past  Physicist, Medical Strain: Low Risk (08/18/2023)   Overall Financial Resource Strain (CARDIA)    Difficulty of Paying Living Expenses: Not hard at all  Food Insecurity: No Food Insecurity (05/15/2024)   Epic    Worried About Programme Researcher, Broadcasting/film/video in the Last Year: Never true    Ran Out of Food in the Last Year: Never true  Transportation Needs: No Transportation Needs (05/15/2024)   Epic    Lack of Transportation (Medical): No    Lack of Transportation (Non-Medical): No  Physical Activity: Inactive (08/18/2023)   Exercise Vital Sign    Days of Exercise per Week: 0 days    Minutes of Exercise per Session: 0 min  Stress: Stress Concern Present (12/09/2022)   Received from [facility] of Occupational Health - Occupational Stress Questionnaire    Feeling of Stress : To some extent  Social Connections: Unknown (08/17/2023)   Social Connection and Isolation Panel    Frequency of Communication with Friends and Family: More than three times a week    Frequency of Social Gatherings with Friends and Family: More than three times a week    Attends Religious Services: Never    Database Administrator or Organizations: No    Attends Banker Meetings: Never    Marital Status: Patient declined  Catering Manager Violence: Not At Risk (05/15/2024)   Epic    Fear of Current or Ex-Partner: No    Emotionally Abused: No    Physically  Abused: No    Sexually Abused: No  Depression (PHQ2-9): Low Risk (09/08/2021)   Depression (PHQ2-9)    PHQ-2 Score: 0  Alcohol Screen: Low Risk (08/18/2023)   Alcohol Screen    Last Alcohol Screening Score (AUDIT): 0  Housing: Low Risk (05/15/2024)   Epic    Unable to Pay for Housing in the Last Year: No    Number of Times Moved in the Last Year: 0    Homeless in the Last Year: No  Utilities: Not At Risk (05/15/2024)   Epic    Threatened with loss of utilities: No  Health Literacy: Adequate Health Literacy (08/18/2023)   B1300 Health Literacy    Frequency of  need for help with medical instructions: Never      Family History  Problem Relation Age of Onset   Diabetes Father        PHYSICAL EXAM:  General: Well appearing.  Cor: No JVD. Regular rhythm, bradycardic.  Lungs: clear Abdomen: soft, nontender, nondistended. Extremities: no edema. Bottom of left foot as skin tear where he has pulled the skin off. No signs of infection.  Neuro:. Affect pleasant   ECG: not done   ASSESSMENT & PLAN:  1: NICM with preserved ejection fraction- - suspect due to uncontrolled HTN - NYHA Good II - euvolemic - weight down 12 pounds from last visit here 10 months ago - Cardiac MRI in 10/2022 noted diffuse patchy hyperenhancement of the basal septal and lateral walls, a pattern of scarring non-ischemic in nature and seen more in patients with history of uncontrolled hypertension, though HCM could not be excluded. The CMR findings probability for cardiac amyloidosis was low. - TTE 03/13/23 showed LVEF of 50-55% with GIII DD. - has echo scheduled on 07/22/23 - genetic testing for HCM done today. Daughter also has HF - continue farxiga  10mg  daily - continue furosemide  40mg  daily - continue metoprolol  succinate 100mg  daily - continue entresto  49/51mg  BID as he was only taking it once daily. If he finds he misses the 2nd dose often, we can change to valsartan  - holding spironolactone  25mg   daily  - proBNP 06/14/24 was 2041.0  2: HTN- - BP  - [facility] PCP 12/24 - BMET 06/14/24 reviewed: sodium 141, potassium 3.9, creatinine 2.24 & GFR 32 - BMET today  3: IDDM- - A1c 01/27/24 reviewed and was 12.3% - He says that if his glucose is <190, he will not take any insulin  as it drops his glucose too low - encouraged close follow-up with PCP regarding this  4: Hyperlipidemia- - unable to calculate LDL - saw [facility] cardiology 12/25 - no longer taking rosuvastatin  but unclear why   5: Tobacco use-  - still not smoking     Chad DELENA Class, FNP 07/18/2024  "

## 2024-07-18 NOTE — Telephone Encounter (Signed)
 Called to confirm/remind patient of their appointment at the [facility] on 07/19/24.   Appointment:   [x] Confirmed  [] Left mess   [] No answer/No voice mail  [] VM Full/unable to leave message  [] Phone not in service  Patient reminded to bring all medications and/or complete list.  Confirmed patient has transportation. Gave directions, instructed to utilize valet parking.

## 2024-07-19 ENCOUNTER — Telehealth: Payer: Self-pay | Admitting: Family

## 2024-07-19 ENCOUNTER — Ambulatory Visit: Admitting: Family

## 2024-07-19 NOTE — Telephone Encounter (Signed)
 Patient did not show for his Heart Failure Clinic appointment on  07/20/23.

## 2024-07-21 ENCOUNTER — Ambulatory Visit: Attending: Family

## 2024-08-10 ENCOUNTER — Telehealth: Payer: Self-pay | Admitting: Adult Health

## 2024-08-10 NOTE — Telephone Encounter (Signed)
 Called to confirm/remind patient of their appointment at the [facility] on 08/11/24.   Appointment:   [] Confirmed  [] Left mess   [] No answer/No voice mail  [x] VM Full/unable to leave message  [] Phone not in service  Patient reminded to bring all medications and/or complete list.  Confirmed patient has transportation. Gave directions, instructed to utilize valet parking.

## 2024-08-11 ENCOUNTER — Ambulatory Visit: Admitting: Adult Health
